# Patient Record
Sex: Female | Born: 1955
Health system: Southern US, Community
[De-identification: ages and names within clinical notes are randomized; demographics above are authoritative.]

## PROBLEM LIST (undated history)

## (undated) DIAGNOSIS — I10 Essential (primary) hypertension: Secondary | ICD-10-CM

## (undated) DIAGNOSIS — E0590 Thyrotoxicosis, unspecified without thyrotoxic crisis or storm: Secondary | ICD-10-CM

## (undated) DIAGNOSIS — B019 Varicella without complication: Secondary | ICD-10-CM

## (undated) HISTORY — DX: Varicella without complication: B01.9

## (undated) NOTE — Nursing Note (Signed)
 Formatting of this note might be different from the original. -------------------------------------------------------------------------------- HCM SUPPORT SERVICES -------------------------------------------------------------------------------- HCM Support Services User Fields:  -------------------------------------------------------------------------------- HCM DISCHARGE PLANNING -------------------------------------------------------------------------------- HCM Discharge Planning Comments:  --- 03/19/2024 09:03 PM by Delon Pesa --- CM received forwarded message from MD: ?PT recommended home health. I?ll be  working in her dc shortly?.  Pt presents with acute CVA. CM met with PT and dtr at bedside to complete  assessment. CM introduced self, discussed role of CM in DCP process. PTA Pt. is  IADLs, No DME used. Pt traveling with her dtr when she fell ill, they were on  their way to [facility], helping her dtr move. Pt lives in [place] with her spouse. Pt has  no HH, HD, CS or O2. Data on face sheet confirmed. Pt has no ACP docs.  Prior  hx of brain tumor 3 yrs ago. DCP: home, pending medical clearance, family will transport. Pt will contact  her PCP for [facility] upon returning to [place]. Family is very supportive and will be  available to assist Pt as needed. Pt reports she has been up walking on her own.  -------------------------------------------------------------------------------- HCM DISCHARGE PLANNING EVALUATION -------------------------------------------------------------------------------- Case Workers:   Simpson,Jennifer Living Status:  Spouse-partner Setting:        Home-residence  ADL Limits:     None or n/a  DME:            None  HCM Discharge Systems developer Fields:  Advertising copywriter Prior to Admission:  None or NA  Current Mental Status/Cognition:  Alert and Oriented  Information obtained from:  Patient                             Family or relative Discharge Barriers, select  all that apply:  None or NA  Readmission (unplanned) in the last 30 days:  No  Patient goals and preferences after discharge:  home  Based on information gathered, is it likely that the patient's care needs can  be met in the environment from which he/she entered the hospital?:  Yes  Proposed Discharge Plan, select one:  Home  Home Services needed:  None or NA  If a caregiver is needed, is there a caregiver available, willing and capable  to provide care?:  Yes  If Yes, provide name and contact number:  family  Community services needed:  None  New DME required at discharge:  None or NA  If new DME is required, is patient able to obtain DME?:  Yes  Home Modifications required:  None or NA  If home modifications are required, is patient able to obtain them?:  Not  Applicable  Patient concerns about obtaining medications on day of discharge?:  None  Transportation needs at discharge:  family  Discharge Plan Discussed with:  Patient                                 Family Have you discussed with the patient how his/her care needs may change over  time?:  Yes  Patient/representative agrees with discharge plan:  Yes  Discussed expected insurance coverage and/or out of pocket expenses:  No  Date / Time:  03/19/2024 8:58 PM  Evaluated by:  Pesa Delon   Date of documentation:  -------------------------------------------------------------------------------- Discharge Planning Additional Information --------------------------------------------------------------------------------  ================================================================================ Updated by:     Simpson,Jennifer ([id number]) - 03/19/2024 8:03 PM ================================================================================ Electronically signed  by Delon Pesa, LCSW at 03/19/2024 21:03 EDT

## (undated) NOTE — Progress Notes (Signed)
 Formatting of this note is different from the original. Images from the original note were not included.  HOSPITAL MEDICINE DAILY PROGRESS NOTE Hospital Day: 0  Patient Identifiers:  Barbara Kim 45 y.o.female DOB: Aug 02, 1956  MRN: [id number] ED Bed Date/Time:  03/18/2024 14:19  Attending Physician:  Nelson Kennel, MD    Patient Care Team: Wolm LELON Scarlet, MD as PCP - General (Family Medicine)  MDM ASSESSMENT/PLAN  Barbara Kim is a 55 y.o.  female with PMHx of HTN, history of meningioma, s/p resection, thyroid  disorder, s/p resection presented with headache  Subacute CVA  Left lower extremity paresthesia  Essential hypertension  History of meningioma  Continue to monitor on tele  Continue statin and aspirin  Continue Norvasc , dose increased  CT head with surgical and chronic appearing changes MRI brain without contrast - subacute infarcts, MRI with contrast recommended MRI angiogram head - no evidence of intracranial arterial occlusion or aneurysm CT angio CT angio - 2 mm aneurysm of left ICA  MRI brain with and without contrast pending  Echo pending Discussed with neurology - no need to complete Mri with contrast  I have reviewed the relevant elements of the check-list below with the team and addressed the issues pertinent DVT prophylaxis: scds Class: Outpatient Observation Code Status: Full Code Medication Reconciliation  Foley Catheter: N/A  Isolation: N/A Adult diet Type: Regular; Modifier: Heart healthy Bowel regimen addressed   DISPOSITION  I have evaluated the patient on 03/19/2024 for discharge planning needs.  It is my recommendation that the following post acute services are needed to support a safe discharge plan post hospitalization.  Payor: MEDICARE / Plan: MEDICARE OF [place] PART A AND B / Product Type: Medicare /  Barriers to progression/discharge: neuro consult, MRI brain,. echo Medically stable for discharge date: 1-2 days Recommended  Next Site of Care: home  SUBJECTIVE HISTORY   Interval History: patient reports left leg heaviness, occasional left arm heaviness and tingling. Reports 1 episode of chest pressure 1 month ago lasting 30 seconds. No dizziness right now.   Medications SCHEDULED  Scheduled Medications  Medication Dose Route Frequency Provider Last Rate Last Admin   acetaminophen   1,000 mg Oral Once Rosina Banas, [place]       amLODIPine   5 mg Oral Daily Bernardino Hamlet, MD   5 mg at 03/19/24 9141   aspirin   81 mg Oral Daily Bernardino Hamlet, MD   81 mg at 03/19/24 0858   AS NEEDED  acetaminophen , 650 mg, Q6H PRN butalbital-acetaminophen -caffeine, 1 tablet, Q6H PRN labetalol , 10 mg, Q10M PRN   INFUSIONS  Infusions  Medication Dose Last Rate    EXAM   PHYSICAL EXAM: VITALS INTAKE/OUTPUT  BP 176/78   Pulse 56   Temp 36.6 C (97.9 F)   Resp 20   Ht 1.422 m (4' 8)   Wt 53.1 kg (117 lb)   SpO2 100%   BMI 26.23 kg/m  ----- Temp:  [36.6 C (97.9 F)-36.9 C (98.4 F)] 36.6 C (97.9 F) Heart Rate:  [44-59] 56 Resp:  [16-20] 20 BP: (120-210)/(58-83) 176/78 SpO2:  [97 %-100 %] 100 % (06/23 1000) No intake or output data in the 24 hours ending 03/19/24 1104 Net IO Since Admission: No IO data has been entered for this period [03/19/24 1104]   Physical Exam Gen: NAD, well appearing HEENT: NCAT, PERRL, EOMI, conjunctiva clear, moist MM, neck supple Heart: RRR, no murmurs appreciated Lungs: CTAB, no crackles, rales, rhonchi  Abd: Soft, non tender, not distended, no guarding, +  BS Extremities: no edema, no deformity  MSK: normal ROM, non tender Neuro: CN grossly intact, sensation intact  Psych: AOx3, normal mood and effect Skin: no obvious rashes, lesions, bruising   Access/Catheter: Patient Lines/Drains/Airways Status     Active Lines, Drains & Airways     Name Placement date Placement time Site Days   Peripheral IV 03/18/24 Left Antecubital 03/18/24  1444  Antecubital  1         LABS/RADIOLOGY RESULTS   Imaging: CT angiogram head Per Protocol Result Date: 03/19/2024 [id number] HISTORY: Possible aneurysm COMPARISON: CT head 03/18/2024, MRI/MRA brain 03/18/2024 CT ANGIOGRAM HEAD TECHNIQUE: Multiple contiguous CT images of the brain without contrast were initially obtained. Brain CT angiogram with intravenous administration of Isovue -370 contrast agent was then acquired with 3D reconstructions performed on a separate independent workstation. FINDINGS: PRECONTRAST HEAD CT: There is no evidence of acute intracranial hemorrhage. There is no mass, significant mass effect, or abnormal extra-axial collection. The basal cisterns are patent. Redemonstrated left temporal encephalomalacia with postsurgical changes of the left pterional craniotomy. The ventricles are normal in size. The skull base and calvarium are normal. HEAD CTA: Atherosclerotic disease of the cavernous, and supraclinoid segments of the bilateral internal carotid arteries (ICAs) without significant stenosis. The petrous segment is also without significant stenosis. Tiny 2 mm aneurysm arising from the left plan/supraclinoid ICA, best seen on series 14, image 130. The bilateral ACA and right MCA are patent. Portions of the left MCA are again diminutive with patent collateral circulation to the distal MCA territories. Mild tortuosity of the vertebrobasilar system. The vertebral, basilar, and posterior cerebral arteries are patent.   Tiny 2 mm aneurysm arising from the left clinoid/supraclinoid ICA. Report Created in [facility] [employer]. Interpreted By: Bethann Ku                                 Electronically signed by: Glory Flaming, MD  03/19/2024 8:47 [id number] Report created in [facility] [employer].   MRI angiogram head without contrast Result Date: 03/18/2024 [id number] MRI ANGIOGRAM HEAD WO CONTRAST HISTORY: Stroke eval, left-sided weakness Comparison: MRI brain without contrast 03/18/2024, CT head 03/18/2024  TECHNIQUE: 3D time-of-flight MRA of the head without intravenous contrast administration. Source data and maximum intensity projections (MIPs) were reviewed. 3-D reconstructions were obtained. FINDINGS: Stenosis: Diminutive appearance of the left MCA from its origin with collateral circulation supplying the MCA territory. Aneurysm: Likely Tiny 2 mm infundibulum arising from the left cavernous ICA (series 2, image 96). There is an anterior communicating artery. The right posterior communicating artery is not well seen and likely hypoplastic. The left posterior communicating artery is not well seen and likely hypoplastic. Similar region of encephalomalacia within the left temporal lobe, better seen on same day MRI brain.   1.  No evidence of intracranial arterial occlusion or aneurysm. 2.  Diminutive appearance of the left MCA from its origin, with patent collateral circulation supplying the MCA territory. 3. Potential 2 mm infundibulum arising from the left cavernous ICA. Follow-up CTA recommended to exclude aneurysm. Report Created in [facility] [employer]. Interpreted By: Bethann Ku                                 Electronically signed by: Lonni Lek, MD  03/18/2024 7:04 [id number] Report created in [facility] [employer].   Ultrasound carotid arteries bilateral Result Date: 03/18/2024 [id number] US  CAROTID BILATERAL HISTORY:  Unilateral weakness COMPARISON: None. TECHNIQUE: A bilateral carotid ultrasound and doppler duplex study was performed.  Complete evaluation of the vertebral arteries was made. FINDINGS: Prominent plaque left carotid bulb. Velocities unremarkable. Antegrade flow is seen within the vertebral arteries bilaterally. Normal waveforms are seen throughout. RIGHT: PSV CCA 83; PSV ICA 109; SYS ICA/CCA 1.3; PDV CCA 20; PDV ICA 30;  DIAS ICA/CCA 1.6 LEFT:  PSV CCA 67; PSV ICA 92; SYS ICA/CCA 1.4; PDV CCA 14; PDV ICA 20; DIAS ICA/CCA 1.4 ** Measurements are in cm/sec.   1.  Prominent plaque left  carotid bulb. However velocities are compatible with less than 50% stenosis involving each ICA Interpreted By: Lonni Lek, MD                                Electronically signed by: Lonni Lek, MD  03/18/2024 6:14 [id number] Report created in [facility] [employer].   MRI brain without contrast Result Date: 03/18/2024 [id number] MRI BRAIN WO CONTRAST HISTORY: Weakness left side COMPARISON: CT head 03/18/2024 TECHNIQUE: Multisequence MRI of the brain without intravenous contrast. FINDINGS: Tiny foci of restricted diffusion with corresponding T2 FLAIR hyperintensity involving the right thalamus and occipital lobe. Postsurgical changes of left frontotemporal craniotomy with left temporal encephalomalacia in the region of the resection cavity. There is mild edema surrounding the resection cavity. The ventricles are normal in size. Flow voids of the larger intracranial vessels are present. Paranasal sinuses and mastoid air cells are predominantly clear. Marrow signal pattern is within normal limits.   Tiny foci of restricted diffusion with corresponding T2 FLAIR hyperintensity involving the right thalamus and posterior temporal, and occipital lobe. Findings are favored to reflect subacute infarcts. However, given patient's history of prior brain tumor contrast-enhanced MRI could be obtained. Report Created in [facility] [employer]. Interpreted By: Mabel Belling                                Electronically signed by: Lonni Lek, MD  03/18/2024 6:10 [id number] Report created in [facility] [employer].   CT head without contrast Result Date: 03/18/2024 [id number] CT HEAD WO CONTRAST HISTORY: Pain/trauma COMPARISON: None. TECHNIQUE: CT scan of the head without IV contrast. Sagittal and coronal reformats obtained. FINDINGS: There is no evidence of intracranial hemorrhage, acute loss of distinction between gray and white matter, mass or mass effect. Encephalomalacia involving the temporal region all  the left.. Surgical changes involving the frontal temporal region on the left extending to the left lateral orbit The basal cisterns are patent. The ventricles are normal in size. The density of the dural venous sinuses is normal. The skull base and calvarium are normal. Paranasal sinuses and mastoid air cells are predominantly clear. The visualized orbits are unremarkable.   1.  Surgical and chronic appearing changes with no obvious acute process Interpreted By: Lonni Lek, MD                                Electronically signed by: Lonni Lek, MD  03/18/2024 3:49 [id number] Report created in [facility] [employer].   CT angiogram head Per Protocol Result Date: 03/19/2024 Tiny 2 mm aneurysm arising from the left clinoid/supraclinoid ICA. Report Created in [facility] [employer]. Interpreted By: Bethann Ku  Electronically signed by: Glory Flaming, MD  03/19/2024 8:47 [id number] Report created in [facility] [employer].   MRI angiogram head without contrast Result Date: 03/18/2024 1.  No evidence of intracranial arterial occlusion or aneurysm. 2.  Diminutive appearance of the left MCA from its origin, with patent collateral circulation supplying the MCA territory. 3. Potential 2 mm infundibulum arising from the left cavernous ICA. Follow-up CTA recommended to exclude aneurysm. Report Created in [facility] [employer]. Interpreted By: Bethann Ku                                 Electronically signed by: Lonni Lek, MD  03/18/2024 7:04 [id number] Report created in [facility] [employer].   Ultrasound carotid arteries bilateral Result Date: 03/18/2024 1.  Prominent plaque left carotid bulb. However velocities are compatible with less than 50% stenosis involving each ICA Interpreted By: Lonni Lek, MD                                Electronically signed by: Lonni Lek, MD  03/18/2024 6:14 [id number] Report created in [facility] [employer].   MRI brain without  contrast Result Date: 03/18/2024 Tiny foci of restricted diffusion with corresponding T2 FLAIR hyperintensity involving the right thalamus and posterior temporal, and occipital lobe. Findings are favored to reflect subacute infarcts. However, given patient's history of prior brain tumor contrast-enhanced MRI could be obtained. Report Created in [facility] [employer]. Interpreted By: Mabel Belling                                Electronically signed by: Lonni Lek, MD  03/18/2024 6:10 [id number] Report created in [facility] [employer].   CT head without contrast Result Date: 03/18/2024 1.  Surgical and chronic appearing changes with no obvious acute process Interpreted By: Lonni Lek, MD                                Electronically signed by: Lonni Lek, MD  03/18/2024 3:49 [id number] Report created in [facility] [employer].   EKG: Results for orders placed or performed during the hospital encounter of 03/18/24 (from the past 36 hours)  ECG 12 lead  Result Value Ref Range   Ventricular Rate 56 BPM   Atrial Rate 56 BPM   PR Interval 206 ms   QRS Duration 82 ms   QT Interval 418 ms   QTC Calculation 403 ms   P Axis 65 degrees   R Axis 17 degrees   T Wave Axis 24 degrees   Impression   Sinus bradycardia Otherwise normal ECG  Confirmed by CAMPBELL,M.D., CHRISTOPHER ([id number]), editor MUSE, DARRELL (SCOTT) ([id number]) on 03/18/2024 3:53:25 PM   Labs:  Recent Results (from the past 48 hours)  ECG 12 lead   Collection Time: 03/18/24 14:33  Result Value Ref Range   Ventricular Rate 56 BPM   Atrial Rate 56 BPM   PR Interval 206 ms   QRS Duration 82 ms   QT Interval 418 ms   QTC Calculation 403 ms   P Axis 65 degrees   R Axis 17 degrees   T Wave Axis 24 degrees  GLUCOSE POCT   Collection Time: 03/18/24 14:39  Result Value Ref Range   Glucose POCT 109 65 - 110 mg/dL  CBC w/auto diff  Collection Time: 03/18/24 14:40  Result Value Ref Range   WBC 7.5 4.5 - 11.0 K/uL   RBC  4.62 3.80 - 5.20 M/uL   Hemoglobin 13.8 11.7 - 16.1 g/dL   Hematocrit 57.5 64.9 - 47.0 %   MCV 91.8 81.0 - 102.0 fL   MCH 29.9 25.0 - 35.0 pg   MCHC 32.5 30.0 - 36.0 g/dL   RDW 87.1 88.4 - 85.3 %   MPV 10.1 No Established Reference Range fL   Neutrophils % 41.9 40.0 - 70.0 %   Lymphocytes % 47.7 (H) 15.0 - 45.0 %   Monocytes % 6.3 1.0 - 8.0 %   Eosinophils % 3.3 0.0 - 6.0 %   BASO % 0.8 0.0 - 2.0 %   MONO # 0.5 No Established Reference Range K/uL   EOS # 0.3 No Established Reference Range K/uL   BASO # 0.1 No Established Reference Range K/uL   Platelets 197 150 - 450 K/uL   nRBC 0 0 - 0 /100WBC   LYMPH # 3.6 No Established Reference Range K/uL   ABS NEUT # 3.13 No Established Reference Range K/uL  Comprehensive metabolic panel   Collection Time: 03/18/24 14:40  Result Value Ref Range   Glucose 101 74 - 109 mg/dL   Sodium 862 863 - 854 mMOL/L   Potassium 4.1 3.4 - 4.5 mMOL/L   Chloride 104 98 - 107 mMOL/L   CO2 23 22 - 29 mMOL/L   Anion Gap 14 9 - 18 mMOL/L   BUN 21 8 - 23 mg/dL   Creatinine 9.19 9.49 - 0.96 mg/dL   Calcium 89.6 (H) 8.8 - 10.2 mg/dL   Protein 6.9 6.0 - 8.0 g/dL   Albumin 4.0 3.5 - 5.2 g/dL   A/G Ratio 1.4 >=8.8   Globulin 2.9 1.5 - 3.8 g/dL   Total Bilirubin 0.3 <=8.9 mg/dL   AST 19 <=64 U/L   Alkaline Phosphatase 61 35 - 104 U/L   ALT 14 <=35 U/L   eGFR (2021) 80 >60 mL/min/1.73 m2  Magnesium   Collection Time: 03/18/24 14:40  Result Value Ref Range   Magnesium 2.0 1.6 - 2.4 mg/dL  Magnesium   Collection Time: 03/18/24 20:01  Result Value Ref Range   Magnesium 1.9 1.6 - 2.4 mg/dL  Phosphorus   Collection Time: 03/18/24 20:01  Result Value Ref Range   Phosphorus 2.7 2.5 - 4.5 mg/dL  Urinalysis Complete w/Microscopic w/Reflex culture if indicated (reflex only for > 18 y.o.)   Collection Time: 03/19/24  2:01  Result Value Ref Range   Color Light Yellow Colorless, Yellow, Straw, Dark Yellow, Light Yellow   Appearance Clear Clear   Specific  Gravity 1.020 1.003 - 1.035   pH 5.5 5.0 - 8.0   Leukocyte esterase Negative Negative, Trace, Not Resulted, Interfering Substance /uL   Nitrite Negative Negative, Not Resulted, Interfering Substance   Protein Negative Negative, Trace, Not Resulted, Interfering Substance   Glucose 2+ (A) Negative, Trace, Not Resulted, Interfering Substance   Ketones Negative Negative, Trace, Not Resulted, Interfering Substance   Urobilinogen Negative Negative, Not Resulted, Interfering Substance mg/dL   Urine Bilirubin Negative Negative, Not Resulted, Interfering Substance   Blood Negative Negative, Trace, Not Resulted, Interfering Substance   WBC <1 <=5 /hpf   RBC UA 1 <=3 /hpf   Bacteria None Seen None Seen /hpf   Squamous Epithelial Cells 0-5 0-5, None Seen /hpf   Casts None Seen None Seen   Crystals None Seen None Seen  Mucus None Seen None Seen /lpf  CBC w/auto diff   Collection Time: 03/19/24  4:45  Result Value Ref Range   WBC 5.8 4.5 - 11.0 K/uL   RBC 4.25 3.80 - 5.20 M/uL   Hemoglobin 12.9 11.7 - 16.1 g/dL   Hematocrit 60.1 64.9 - 47.0 %   MCV 93.6 81.0 - 102.0 fL   MCH 30.4 25.0 - 35.0 pg   MCHC 32.4 30.0 - 36.0 g/dL   RDW 86.8 88.4 - 85.3 %   MPV 10.2 No Established Reference Range fL   Neutrophils % 46.0 40.0 - 70.0 %   Lymphocytes % 41.0 15.0 - 45.0 %   Monocytes % 8.7 (H) 1.0 - 8.0 %   Eosinophils % 3.6 0.0 - 6.0 %   BASO % 0.7 0.0 - 2.0 %   MONO # 0.5 No Established Reference Range K/uL   EOS # 0.2 No Established Reference Range K/uL   BASO # 0.0 No Established Reference Range K/uL   Platelets 188 150 - 450 K/uL   nRBC 0 0 - 0 /100WBC   LYMPH # 2.4 No Established Reference Range K/uL   ABS NEUT # 2.65 No Established Reference Range K/uL  Comprehensive metabolic panel   Collection Time: 03/19/24  4:45  Result Value Ref Range   Glucose 118 (H) 74 - 109 mg/dL   Sodium 857 863 - 854 mMOL/L   Potassium 3.9 3.4 - 4.5 mMOL/L   Chloride 108 (H) 98 - 107 mMOL/L   CO2 25 22 - 29  mMOL/L   Anion Gap 13 9 - 18 mMOL/L   BUN 22 8 - 23 mg/dL   Creatinine 9.09 9.49 - 0.96 mg/dL   Calcium 9.0 8.8 - 89.7 mg/dL   Protein 6.4 6.0 - 8.0 g/dL   Albumin 3.7 3.5 - 5.2 g/dL   A/G Ratio 1.4 >=8.8   Globulin 2.7 1.5 - 3.8 g/dL   Total Bilirubin 0.2 <=8.9 mg/dL   AST 13 <=64 U/L   Alkaline Phosphatase 53 35 - 104 U/L   ALT 12 <=35 U/L   eGFR (2021) 70 >60 mL/min/1.73 m2   DISCLAIMER: This chart was created using M-Modal dictation software. Efforts were made by me to ensure accuracy, however some errors may be present due to limitations of this technology and occasionally words are not transcribed as intended.  Electronically Signed:    Nelson Kennel, MD 03/19/24 8381   Nelson Kennel, MD 03/20/24 0840  Electronically signed by Nelson Kennel, MD at 03/19/2024 16:18 EDT Electronically signed by Nelson Kennel, MD at 03/20/2024  8:40 EDT

## (undated) NOTE — Consults (Signed)
 Associated Order(s): IP CONSULT TO CARDIOLOGY Formatting of this note is different from the original. Images from the original note were not included. Cardiology Consultation  Name Barbara Kim Age/Sex 47 y.o./female  [facility] DOB 12/24/1955  PCP Wolm LELON Scarlet, MD MRN [id number]  Note Entered by Carlyon Southgate, NP LOS 1 days   HPI: Reason for consultation CVA, needs heart monitor before discharge  HPI: This 16 year old female with a past medical history significant for HTN, cerebral meningioma s/p left craniotomy (01/2021), and hyperthyroidism presented on 06/22 with a chief complaint of stroke like symptoms. Patient had sudden onset headache and left sided weakness/numbness that has been intermittent over the last couple weeks, but became more constant. She also reports a near syncopal episode. Patient did take aspirin  before coming to the ER. Her symptoms also resolved  This patient has exclusion criteria for stroke protocol for TPA therefore they are not a candidate for lytics Patient found to have a subacute R PCA infarct. Neurology consulted and requesting a 14 day cardiac monitor to rule out atrial arrhythmias. Patient is from [place], [place] and was traveling through to help her daughter move to [place] . Patient with no known significant cardiac history, she denies any history of atrial fibrillation or any irregular heart rhythms.   The history section was last reviewed by Flint LOISE Clement Dwayne, RN on Mar 18, 2024.  History reviewed. No pertinent past medical history.  Past Surgical History:  Procedure Laterality Date   CRANIOTOMY  2022   Social History   Socioeconomic History   Marital status: Married   Additional Smoking   No family history on file.   No family status information on file.   Prior to Admission medications  Not on File   No Known Allergies  Review of Systems: 14 point review of systems performed with all pertinent positives and negatives  as per the HPI.  Objective: Vital signs for last 24 hours: Temp:  [36.4 C (97.5 F)-36.7 C (98.1 F)] 36.6 C (97.9 F) (06/24 1207) Heart Rate:  [48-60] 50 (06/24 1207) Resp:  [16-24] 20 (06/24 1207) BP: (128-169)/(59-80) 151/63 (06/24 1207)  Imaging: No results found.  Scheduled Meds: Scheduled Medications  Medication Dose Route Frequency Provider Last Rate Last Admin   amLODIPine   10 mg Oral Daily Ekaterina Latycheva, MD   10 mg at 03/20/24 0948   aspirin   81 mg Oral Daily Bernardino Hamlet, MD   81 mg at 03/20/24 0947   atorvastatin  40 mg Oral Bedtime Ekaterina Latycheva, MD   40 mg at 03/19/24 2136   Continuous Infusions: Infusions  Medication Dose Last Rate   PRN Meds:PRN Medications: acetaminophen ,butalbital-acetaminophen -caffeine,labetalol   Chemistry  Recent Labs    03/18/24 1440 03/18/24 2001 03/19/24 0445  NA 137  --  142  K 4.1  --  3.9  CL 104  --  108*  CO2 23  --  25  BUN 21  --  22  CREATININE 0.80  --  0.90  GLU 101  --  118*  MG 2.0 1.9  --   PHOS  --  2.7  --    CBC Recent Labs    03/18/24 1440 03/19/24 0445  HGB 13.8 12.9  HCT 42.4 39.8  MCV 91.8 93.6  PLT 197 188   Coags Recent Labs    03/18/24 1440 03/19/24 0445  PROT 6.9 6.4   LFTs Recent Labs    03/18/24 1440 03/19/24 0445  PROT 6.9 6.4  BILITOT 0.3 0.2  ALT 14 12  AST 19 13  ALKPHOS 61 53    Cardiac No results for input(s): CPK, CKMB, TROPONINI in the last 72 hours. No results found for: BNP Endocrine Lab Results  Component Value Date   CHOL 199 03/19/2024   HDL 47 03/19/2024   Lab Results  Component Value Date   HGBA1C 6.1 03/19/2024   No results found for: TSH   PHYSICAL EXAM Gen: NAD, Alert HEENT: PERRL, EOMI, neck supple CV: RRR, normal S1 S2 Lungs: Diminished lung bases, no W/R/R Abd: S/NT/ND, BS (+) Ext: Warm, No clubbing/cyanosis Skin: warm, dry, no rashes Neuro: No focal deficits Psych: Mood approp.   Cardiac History/Studies:   EKG   03/18/24   Results  Assessment/Plan:  Active Hospital Problems:   Acute CVA (cerebrovascular accident) (CMS, HHS-HCC)  Impression/PLAN: Acute CVA -Found to have subacute R PCA stroke  -History of meningioma s/p resection, not related to stroke  -Neurology requesting 14 day monitor to rule out atrial fibrillation  -EKG on admission showed sinus bradycardia with a HR of 56 bpm -ECHO obtained Results for orders placed during the hospital encounter of 03/18/24  Echo Complete  Interpretation Summary Normal left ventricular size and function, ejection fraction 65%. Abnormal diastolic compliance. Trivial mitral and tricuspid regurgitation. Bubble study is negative for obvious right-to-left shunt.  -Telemetry reviewed with no atrial arrhythmias since admission -MCOT ordered, will have placed prior to discharge  -Continue on telemetry   Essential Hypertension -Uncontrolled  -Amlodipine  increased to 10mg  today -Continue to monitor  Hyperlipidemia -Lipid panel with an LDL of 133 -Continue statin therapy   DISCLAIMER: This chart was created using M-Modal dictation software. Efforts were made by me to ensure accuracy, however some errors may be present due to limitations of this technology and occasionally words are not transcribed as intended.  Communication: I examined the patient and reviewed the chart. I discussed the patient's status and plan of care with Patient's Treatment Team Electronically signed: Coding Carlyon Southgate, NP 03/20/2024 / 13:03   Carlyon LOISE Southgate, NP 03/20/24 1330   Carlyon LOISE Southgate, NP 03/20/24 2233  Attending attestation: Patient seen and examined independently. Agree with findings, assessment and plan as per nurse practitioner note, unless otherwise amended. Entirety of medical decision making was made by me.  36 year old female with history of hypertension, cerebral meningioma who was admitted with CVA. Echocardiogram shows no significant structural  heart disease.  EKG and laboratory data reviewed. Pertinent imaging data reviewed.  Physical Exam:  Gen: NAD HEENT: neck supple CV:  Regular rate and rhythm Lungs: Diminished bases Abd: S/NT/ND, BS (+) Ext: Warm, No clubbing/cyanosis  Assessment/Plan:  Hospital Derived Problem List: Patient Active Problem List  Diagnosis   Acute CVA (cerebrovascular accident) (CMS, HHS-HCC)   Brain tumor (CMS, HHS-HCC)   Cerebral infarction, unspecified (CMS, HHS-HCC)   Neoplasm of meninges   Essential hypertension, benign   Family history not obtainable due to adoption   H/O meningioma of the brain   Hallux valgus with bunions   History of cerebral meningioma   Mood swings   Hypertensive disorder   Osteopenia   Osteoporosis   Aneurysm of internal carotid artery (HHS-HCC)   Patient will be discharged on heart rhythm monitor to rule out paroxysmal atrial fibrillation. Continue titration of medical therapy as above.  Counseled on risk factor modification and medical compliance.  Wendelyn Skene, MD Interventional Cardiology  DISCLAIMER: This chart was created using M-Modal dictation software. Efforts were made by  me to ensure accuracy, however some errors may be present due to limitations of this technology and occasionally words are not transcribed as intended.   Wendelyn Skene, MD 03/26/24 [phone]  Electronically signed by Carlyon LOISE Southgate, NP at 03/20/2024 13:30 EDT Electronically signed by Carlyon LOISE Southgate, NP at 03/20/2024 22:33 EDT Electronically signed by Wendelyn Skene, MD at 03/26/2024 14:57 EDT

## (undated) NOTE — ED Provider Notes (Signed)
 Formatting of this note is different from the original. History of Present Illness   Patient Identification Barbara Kim is a 42 y.o. female.  Chief Complaint  Chief Complaint  Patient presents with   Headache    Pt c/o HA all day, lower left leg tingling/ numbness since yesterday, near syncopal episode last week. Pt took ASA PTA. Pt sensation decreased on left lower leg, but rest of body feels the same. Hx HTN (takes amlodipine  daily), brain tumor removal several years ago, her screenings have been good with respect to her monitoring imaging and she most recently had 1 in early June that was unknown the results but they did have her called her about it.  She has had 3 episodes of this left arm and left leg numbness tingling and a little bit of a week sensation that has happened over the course of the past week or.  She said that it never lasts longer than 30 seconds at a time.  She has generally very healthy and does a lot of high intensity exercise.  She last exercised on Friday and had no issues.  Her only medical issue that she is treated for is high blood pressure.      History reviewed. No pertinent past medical history. No family history on file. Past Surgical History:  Procedure Laterality Date   CRANIOTOMY  2022   Current Facility-Administered Medications  Medication Dose Route Frequency Provider Last Rate Last Admin   acetaminophen  (TYLENOL ) tablet 1,000 mg  1,000 mg Oral Once Rosina Banas, [place]       No current outpatient medications on file.   No Known Allergies Social History   Socioeconomic History   Marital status: Married   Review of Systems Pertinent positives and negatives were discussed fully with the patient, and except as described in the HPI, they are negative.   Physical Exam   BP 193/83   Pulse 52   Temp 36.6 C (97.9 F) (Oral)   Resp 18   Ht 142.2 cm   Wt 53.1 kg   SpO2 99%   BMI 26.23 kg/m   PE - Physical examination:  Constitutional:  Oriented, Awake and alert, Well developed, Good Hydration, NAD Eyes: Sclera clear, no icterus, Conjunctivae and lid normal, EOMI;  Ear Nose Mouth Throat: Ear, nose and throat exam grossly unremarkable Neck:  unremarkable Cardiovascular: NL S1/S2, No murmurs gallops or rubs detected, Distal pulses normal,  Respiratory: Normal respiratory effort/excursion, Breath sounds equal bilaterally, No rales, rhonchi, or wheezes,  Chest: No chest deformity, Movement normal, Movement symmetrical;  Gastrointestinal: Abdomen soft, Nontender, Nondistended,  No rebound, guarding, or rigidity.  No appreciable masses or pulsatile masses Genitourinary: Unremarkable;  Musculoskeletal: Normal extremities,  Skin: Skin turgor normal, Mucosal membranes moist, Skin color good;  Neuro:  Very strong motor exam, normal sensory exam, normal cranial nerves, normal finger-to-nose and heel-shin maneuvers Normal coordination, Normal speech;  Psychiatric: No abnormalities of mood or affect;  Lymph: Unremarkable;   ED Course/MDM   Medications  acetaminophen  (TYLENOL ) tablet 1,000 mg (1,000 mg Oral Not Given 03/18/24 1625)   Patient Vitals for the past 24 hrs:  BP Temp Temp src Pulse Resp SpO2 Height Weight  03/18/24 1901 193/83 36.6 C (97.9 F) Oral 52 18 99 % -- --  03/18/24 1423 -- -- -- -- -- -- 142.2 cm 53.1 kg  03/18/24 1418 171/72 36.7 C (98.1 F) -- 59 18 97 % -- --    MDM:  Differential Diagnosis that have been  ruled out based on HPI,ROS, and PE: Acute transverse myelitis, Carotid artery dissection, Guillain-Barre syndrome, Meningitis,  Spinal cord abscess, Spinal cord pathology,  Temporal arteritis  Differential Diagnosis: Brain tumor, Complicated migraine, Multiple sclerosis, Postictal paralysis, Stroke, TIA;   Diagnostic Evaluation: History AND physical examination, Xrays, 12 lead ECG, Blood work, Cardiac monitor, CAT scan;   Amount and complexity of data: The EM Physician performed a face to face evaluation  in this patient, Current EKG reviewed, Current labs reviewed, Current imaging reviewed and independently interpreted    Discussion:  Patient has had transient symptoms lasting less than 30 seconds at a time.  She has only had 3 of these in the past week and she is very healthy.  She has had a prior history of a meningioma with resection.  This patient has exclusion criteria for stroke protocol for TPA therefore they are not a candidate for lytics.  She is here visiting from out of town and passing through.  My suspicion for TIA is actually very low.  I felt that if her MRI was negative and her carotid ultrasounds are negative then I would feel comfortable letting the patient go home to follow up with her primary care physician as an outpatient.  The MRI demonstrated an abnormal finding in the right thalamus and posterior temporal and occipital lobe which appear to represent subacute infarcts.  Medical Decision Making Amount and/or Complexity of Data Reviewed  Prior external medical records reviewed:   Independent Historian utilized for obtaining significant additional historical details relevant to patient care:  []  Spouse, []  EMS, []  Other family member/friend  []  Other: []   Considered hospitalization/escalation of care (also refer to disposition, if unchecked) []   Considered/administered parenteral controlled substances (also refer to administered medications) []   Significant social determinants existed which impacted diagnosis/treatment  Labs:  as ordered. Decision-making details documented in ED Course. Radiology: as ordered + independent interpretation performed. Details documented in results section below. ECG/medicine tests: as ordered + independent interpretation performed. Details documented under results section below.  Consultations: Hospitalist consulted. Treatment options were discussed and plan of care agreed upon.  HEART Score   Results   EKG interpreted per my independent  review of the imaging as below: Results for orders placed or performed during the hospital encounter of 03/18/24  ECG 12 lead  Result Value Ref Range   Ventricular Rate 56 BPM   Atrial Rate 56 BPM   PR Interval 206 ms   QRS Duration 82 ms   QT Interval 418 ms   QTC Calculation 403 ms   P Axis 65 degrees   R Axis 17 degrees   T Wave Axis 24 degrees   Impression   Sinus bradycardia Otherwise normal ECG  Confirmed by CAMPBELL,M.D., CHRISTOPHER ([id number]), editor MUSE, DARRELL (SCOTT) ([id number]) on 03/18/2024 3:53:25 PM   Labs: Results for orders placed or performed during the hospital encounter of 03/18/24  ECG 12 lead   Collection Time: 03/18/24 14:33  Result Value Ref Range   Ventricular Rate 56 BPM   Atrial Rate 56 BPM   PR Interval 206 ms   QRS Duration 82 ms   QT Interval 418 ms   QTC Calculation 403 ms   P Axis 65 degrees   R Axis 17 degrees   T Wave Axis 24 degrees  GLUCOSE POCT   Collection Time: 03/18/24 14:39  Result Value Ref Range   Glucose POCT 109 65 - 110 mg/dL  CBC w/auto diff   Collection Time: 03/18/24  14:40  Result Value Ref Range   WBC 7.5 4.5 - 11.0 K/uL   RBC 4.62 3.80 - 5.20 M/uL   Hemoglobin 13.8 11.7 - 16.1 g/dL   Hematocrit 57.5 64.9 - 47.0 %   MCV 91.8 81.0 - 102.0 fL   MCH 29.9 25.0 - 35.0 pg   MCHC 32.5 30.0 - 36.0 g/dL   RDW 87.1 88.4 - 85.3 %   MPV 10.1 No Established Reference Range fL   Neutrophils % 41.9 40.0 - 70.0 %   Lymphocytes % 47.7 (H) 15.0 - 45.0 %   Monocytes % 6.3 1.0 - 8.0 %   Eosinophils % 3.3 0.0 - 6.0 %   BASO % 0.8 0.0 - 2.0 %   MONO # 0.5 No Established Reference Range K/uL   EOS # 0.3 No Established Reference Range K/uL   BASO # 0.1 No Established Reference Range K/uL   Platelets 197 150 - 450 K/uL   nRBC 0 0 - 0 /100WBC   LYMPH # 3.6 No Established Reference Range K/uL   ABS NEUT # 3.13 No Established Reference Range K/uL  Comprehensive metabolic panel   Collection Time: 03/18/24 14:40  Result Value Ref Range    Glucose 101 74 - 109 mg/dL   Sodium 862 863 - 854 mMOL/L   Potassium 4.1 3.4 - 4.5 mMOL/L   Chloride 104 98 - 107 mMOL/L   CO2 23 22 - 29 mMOL/L   Anion Gap 14 9 - 18 mMOL/L   BUN 21 8 - 23 mg/dL   Creatinine 9.19 9.49 - 0.96 mg/dL   Calcium 89.6 (H) 8.8 - 10.2 mg/dL   Protein 6.9 6.0 - 8.0 g/dL   Albumin 4.0 3.5 - 5.2 g/dL   A/G Ratio 1.4 >=8.8   Globulin 2.9 1.5 - 3.8 g/dL   Total Bilirubin 0.3 <=8.9 mg/dL   AST 19 <=64 U/L   Alkaline Phosphatase 61 35 - 104 U/L   ALT 14 <=35 U/L   eGFR (2021) 80 >60 mL/min/1.73 m2  Magnesium   Collection Time: 03/18/24 14:40  Result Value Ref Range   Magnesium 2.0 1.6 - 2.4 mg/dL   Imaging:  Per my independent review, please refer to formal report. Results for orders placed or performed during the hospital encounter of 03/18/24  CT head without contrast   Narrative   [id number]  CT HEAD WO CONTRAST  HISTORY: Pain/trauma   COMPARISON: None.  TECHNIQUE: CT scan of the head without IV contrast. Sagittal and coronal reformats obtained.  FINDINGS: There is no evidence of intracranial hemorrhage, acute loss of distinction between gray and white matter, mass or mass effect.   Encephalomalacia involving the temporal region all the left.. Surgical changes involving the frontal temporal region on the left extending to the left lateral orbit  The basal cisterns are patent.  The ventricles are normal in size.  The density of the dural venous sinuses is normal.  The skull base and calvarium are normal.  Paranasal sinuses and mastoid air cells are predominantly clear. The visualized orbits are unremarkable.   Impression   1.  Surgical and chronic appearing changes with no obvious acute process  Interpreted By: Lonni Lek, MD                                 Electronically signed by: Lonni Lek, MD  03/18/2024 3:49 [id number] Report created in [facility] [employer].  MRI brain without  contrast   Narrative    [id number]  MRI BRAIN WO CONTRAST  HISTORY: Weakness left side   COMPARISON: CT head 03/18/2024  TECHNIQUE: Multisequence MRI of the brain without intravenous contrast.   FINDINGS:  Tiny foci of restricted diffusion with corresponding T2 FLAIR hyperintensity involving the right thalamus and occipital lobe.   Postsurgical changes of left frontotemporal craniotomy with left temporal encephalomalacia in the region of the resection cavity. There is mild edema surrounding the resection cavity.  The ventricles are normal in size.  Flow voids of the larger intracranial vessels are present.  Paranasal sinuses and mastoid air cells are predominantly clear.  Marrow signal pattern is within normal limits.   Impression   Tiny foci of restricted diffusion with corresponding T2 FLAIR hyperintensity involving the right thalamus and posterior temporal, and occipital lobe. Findings are favored to reflect subacute infarcts. However, given patient's history of prior brain tumor contrast-enhanced MRI could be obtained.  Report Created in [facility] [employer].  Interpreted By: Mabel Belling                                 Electronically signed by: Lonni Lek, MD  03/18/2024 6:10 [id number] Report created in [facility] [employer].  MRI angiogram head without contrast   Narrative   [id number]  MRI ANGIOGRAM HEAD WO CONTRAST  HISTORY: Stroke eval, left-sided weakness   Comparison: MRI brain without contrast 03/18/2024, CT head 03/18/2024  TECHNIQUE: 3D time-of-flight MRA of the head without intravenous contrast administration. Source data and maximum intensity projections (MIPs) were reviewed. 3-D reconstructions were obtained.   FINDINGS:  Stenosis: Diminutive appearance of the left MCA from its origin with collateral circulation supplying the MCA territory.  Aneurysm: Likely Tiny 2 mm infundibulum arising from the left cavernous ICA (series 2, image 96).  There is an  anterior communicating artery. The right posterior communicating artery is not well seen and likely hypoplastic. The left posterior communicating artery is not well seen and likely hypoplastic.  Similar region of encephalomalacia within the left temporal lobe, better seen on same day MRI brain.   Impression   1.  No evidence of intracranial arterial occlusion or aneurysm. 2.  Diminutive appearance of the left MCA from its origin, with patent collateral circulation supplying the MCA territory. 3. Potential 2 mm infundibulum arising from the left cavernous ICA. Follow-up CTA recommended to exclude aneurysm.  Report Created in [facility] [employer].  Interpreted By: Bethann Ku                                 Electronically signed by: Lonni Lek, MD  03/18/2024 7:04 [id number] Report created in [facility] [employer].  Ultrasound carotid arteries bilateral   Narrative   [id number]  US  CAROTID BILATERAL  HISTORY: Unilateral weakness   COMPARISON: None.  TECHNIQUE: A bilateral carotid ultrasound and doppler duplex study was performed.  Complete evaluation of the vertebral arteries was made.  FINDINGS: Prominent plaque left carotid bulb. Velocities unremarkable.  Antegrade flow is seen within the vertebral arteries bilaterally.  Normal waveforms are seen throughout.  RIGHT: PSV CCA 83; PSV ICA 109; SYS ICA/CCA 1.3; PDV CCA 20; PDV ICA 30;  DIAS ICA/CCA 1.6  LEFT:  PSV CCA 67; PSV ICA 92; SYS ICA/CCA 1.4; PDV CCA 14; PDV ICA 20; DIAS ICA/CCA 1.4  ** Measurements are in cm/sec.   Impression   1.  Prominent plaque left carotid bulb. However velocities are compatible with less than 50% stenosis involving each ICA  Interpreted By: Lonni Lek, MD                                 Electronically signed by: Lonni Lek, MD  03/18/2024 6:14 [id number] Report created in [facility] [employer].   Reassessment   Reassessment:   The patient was updated on the status  of care; no clinical worsening appreciated  Disposition   WAS ADMITTED AND TREATED  Condition     Attending/admitting provider: durkin, ryan [[id number]] Estimated length of stay: less than two midnights Admitting diagnosis: acute cva (cerebrovascular accident) (cms, hhs-hcc) [[id number]]  Final Diagnosis   The primary encounter diagnosis was Headache, unspecified headache type. A diagnosis of Cerebrovascular accident (CVA), unspecified mechanism (CMS, HHS-HCC) was also pertinent to this visit.   Lonni CHRISTELLA Shams, MD 03/18/24 1951  Electronically signed by Lonni CHRISTELLA Shams, MD at 03/18/2024 19:51 EDT

## (undated) NOTE — Code Documentation (Signed)
 Formatting of this note is different from the original. Date:03/24/24  Dear Dr: Nelson Kennel, MD   Please respond to this question to accurately reflect the severity of illness of your patient:    Based on your medical judgement and the clinical indicators listed below, can you further specify the known or suspected condition being treated or monitored   (ie, Cerebral edema,  Vasogenic edema,   or other more appropriate diagnosis)?    Questions?  Contact: [place]  [facility] Query Line  at  [phone] . Thank you.   CODER/CDI SPECIALIST QUESTION SECTION: The medical record reflects the following diagnosis/clinical indicators and/or pertinent procedure findings:   Diagnosis/Clinical Indicators and/or Pertinent Procedure Findings Source Document(s)/Date(s)   Patient presents to the hospital with acute headache today with left lower extremity tightness and numbness with some mild weakness.  She says she had noticed changes of her left lower extremity started about 2 weeks ago and would come and go HP 03/18/2024   Subacute CVA  Left lower extremity paresthesia   Discharge summary 03/20/2024   There is mild edema surrounding the resection cavity.  MRI Brain 03/18/2024     PHYSICIAN RESPONSE SECTION: If known, please provide additional documentation in the space below in response to the question stated above and/or document your response within the medical record.      If clinically unable to determine, enter an ?X? in the box corresponding to your answer and/or document within the medical record.   x Clinically unable to determine   GENERAL QUERY N11  (effective date: 07/28/2016) THIS FORM IS A PERMANENT PART OF THE MEDICAL RECORD   Patient Name:Barbara Kim Date:03/18/2024 D/C Date:03/20/2024              FM#:[id number] Acct [id number] Check here if the query generated was verbal:     Electronically signed by Clayburn Aran at 03/24/2024  2:53  EDT Electronically signed by Nelson Kennel, MD at 04/02/2024  7:24 EDT

## (undated) NOTE — Consults (Signed)
 Associated Order(s): IP CONSULT TO NEUROLOGY Formatting of this note is different from the original. [facility] Neurology Consultation Note   ------------------------------------------------------------------------------------------------------------------- Patient Name: Barbara Kim    Date of Birth: 17-Jul-1956 ; Age: 32 y.o.   MRN: [id number]               Primary Care MD:  Wolm LELON Scarlet, MD             Referring Provider: Nelson Kennel, MD  Author(s):  Rockie DELENA Litter, MD  Date: 03/19/2024    Time: 19:02   LOS: 0 days  ----------------------------------------------------------------------------------------------------------------- Reason for Consultation:  stroke  HPI:  Barbara Kim is a 44 y.o. female, right handed, presented with 2 weeks of intermittent left leg and arm numbness and heaviness. Resolved on admission. Hypertensive at home uncontrolled   ------------------------------------------------------------------------------------------------------------------  Past Medical History:  has no past medical history on file.  Surgical History:                              has a past surgical history that includes Craniotomy (2022).  Social History   Non smoker  Family History:  family history is not on file.  Allergies reviewed Pertinent Home Medications reviewed  Inpatient Medications: Scheduled Meds: Scheduled Medications  Medication Dose Route Frequency Provider Last Rate Last Admin   [START ON 03/20/2024] amLODIPine   10 mg Oral Daily Ekaterina Latycheva, MD       aspirin   81 mg Oral Daily Ryan Durkin, MD   81 mg at 03/19/24 0858   atorvastatin  40 mg Oral Bedtime Ekaterina Latycheva, MD       Continuous Infusions: Infusions  Medication Dose Last Rate   PRN Meds:.PRN Medications: acetaminophen ,butalbital-acetaminophen -caffeine,labetalol   Review of Systems: Pertinent items are noted in HPI.  Gen Physical Exam: Blood pressure 151/71, pulse 60,  temperature 36.7 C (98.1 F), resp. rate 18, height 1.422 m (4' 8), weight 53.1 kg (117 lb), SpO2 97%, not currently breastfeeding. Weight (last 2 days)    Date/Time Weight   03/18/24 1423 53.1 kg (117 lb)    General Appearance: WDWN  Neurological Exam: MSE: Alert and fully oriented Normal registration, comprehension, attention and concentration Speech nondysarthic language with naming, repetition and reading intact  CN: II:  PERRL, VF intact III/IV/VI: EOMI V: symmetric VII: symmetric VIII: hearing was normal IX/X: uvula was midline and the palate elevation was symmetric  XI: headmidline XII: tongue midline  Sensation: intact  Motor:  Full strength  Reflexes:1+  Coordination: No dysmetria  Labs: (all relevant labs were reviewed and if necessary summarized in HPI)  Stroke Work Up: (All neuro images were personally reviewed and interpreted)    CUS no stenosis CTA head: no stenosis or occlusion: 2 mm L ICA neurysm arising from the left plan/supraclinoid ICA, patient vertebrobasilar  MRI Brain WO performed on 6/23: R PCA infarcts punctate, left temporal encephalomalacia  Risk stratification Labs Lipid Panel  Component Value Date   TRIG 96 03/19/2024   CHOL 199 03/19/2024   HDL 47 03/19/2024   LDLCALC 133 (H) 03/19/2024   CHOLHDL 4.2 03/19/2024   No results found for: HGBA1C   Assessment: Barbara Kim 38 y.o. female with subacute R PCA infarct. Ok to cancel MRI [facility] as imaging findings are clearly consistent with subacute stroke and not related to prior meningioma. L ICA aneurysm not related and can be followed as an outpatient  Diagnosis: #subacute R PCA stroke #hypertension #hyperlipidemia #  Prior left temporal meningioma s/p resection  Stroke etiology:  cryptogenic  Recommendations Q4 Neuro checks Telemetry while admistted Stroke work up is incomplete: TTE, 14 day heart rate monitoring for atrial fibrillation  A: Secondary Stroke  Prevention ASA 81 mg  B: Blood pressure Regulation 1st 24hrs post symptom onset--> Permissive HTN  up to SBP 200-, avoid large fluctuations, hypotension, hydralazine , hold/limit home antihypertensives >24hr out-->daily gradual reduction BP 25-30% with overall goal : normotensive  C: Cholesterol, panel  LDL in particular (Goal: <70) Above goal, high intensity statin  D:   HgA1c  (Goal<7)  HgA1c ordered Avoid significant hyperglycemia, BG <180 Care per primary team  E. Tobacco Cessation if applicable  PT/OT/SLP    Thank you for involving me in the care of this patient. Please don't hesitate to contact me with any questions or concerns regarding the patient.  Amount and/or Complexity of Data Reviewed Clinical lab tests: cbc/cmp Tests in the radiology section of CPT:  Tests in the medicine section of CPT: reviewed EKG Discussion of test results with the performing providers:  Obtain history from someone other than the patient: Daughter at bedside Review and summarize past medical records: hospital admission note and progress note 6/23 Discuss the patient with other providers: discussed the patient care management plan with Dr. Thomasine Independent visualization of images,: I personally reviewed and interpreted MRI  Risk of Complications, Morbidity, and/or Mortality high  Signature: Rockie DELENA Litter, MD       Date: 03/19/2024     Time: 19:02    Rockie DELENA Litter, MD 03/19/24 1910  Electronically signed by Rockie DELENA Litter, MD at 03/19/2024 19:10 EDT

## (undated) NOTE — Progress Notes (Signed)
 Formatting of this note is different from the original. Images from the original note were not included.   [facility] Neurology Follow-Up Note   ------------------------------------------------------------------------------------------------------------------- Patient Name: Barbara Kim    Date of Birth: 1955-12-04 ; Age: 10 y.o.   MRN: [id number]               Primary Care MD:  Wolm LELON Scarlet, MD             Referring Provider: Nelson Kennel, MD  Author(s):  Toribio Distance, NP  Date: 03/20/2024    Time: 9:51   LOS: 1 day  ----------------------------------------------------------------------------------------------------------------- Reason for Follow-Up:  CVA management  Brief HPI:  Barbara Kim is a 65 y.o. female, right handed, presented with 2 weeks of intermittent left leg and arm numbness and heaviness. Resolved on admission. Hypertensive at home uncontrolled.   MRI brain indicating subacute infarcts in the right thalamus, posterior temporal, and occipital lobe.  MRI angiogram of head with no evidence of intracranial arterial occlusion or aneurysm.  CT angiogram of the head with a tiny 2 mm aneurysm arising from the left clinoid/supraclinoid ICA.  CT head with surgical and chronic appearing changes with no acute process.  Neurology asked to assist with management. ------------------------------------------------------------------------------------------------------------------  Interval history.  No acute neurological changes reported overnight.  No seizures.  Hospitalist note reviewed from 03/19/2024.  Vital signs reviewed from this morning notable for a blood pressure of 169/80, heart rate 54, temperature 97.7.  Lab work including CBC and chemistry reviewed from 03/19/2024.  ------------------------------------------------------------------------------------------------------------------  Workup MRI brain 6/23: R PCA infarcts punctate, left temporal encephalomalacia MRI  angiogram of head with no evidence of intracranial arterial occlusion or aneurysm.   CT angiogram of the head with a tiny 2 mm aneurysm arising from the left clinoid/supraclinoid ICA.   CT head with surgical and chronic appearing changes with no acute process.  ECG sinus bradycardia LDL 133, hemoglobin A1c 6.1 TTE pending  Inpatient Medications: Scheduled Meds: Scheduled Medications  Medication Dose Route Frequency Provider Last Rate Last Admin   amLODIPine   10 mg Oral Daily Ekaterina Latycheva, MD   10 mg at 03/20/24 0948   aspirin   81 mg Oral Daily Ryan Durkin, MD   81 mg at 03/20/24 0947   atorvastatin  40 mg Oral Bedtime Ekaterina Latycheva, MD   40 mg at 03/19/24 2136   Continuous Infusions: Infusions  Medication Dose Last Rate   PRN Meds:.PRN Medications: acetaminophen ,butalbital-acetaminophen -caffeine,labetalol   Gen Physical Exam: Blood pressure 169/80, pulse 54, temperature 36.5 C (97.7 F), temperature source Oral, resp. rate 24, height 1.422 m (4' 8), weight 53.1 kg (117 lb), SpO2 96%, not currently breastfeeding. Weight (last 2 days)    Date/Time Weight   03/18/24 1423 53.1 kg (117 lb)    General Appearance: WDWN  Neurological Exam: MSE: Alert and fully oriented Normal registration, comprehension, attention and concentration Speech nondysarthic language with naming, repetition and reading intact  CN: II:  PERRL, VF intact III/IV/VI: EOMI V: symmetric VII: symmetric VIII: hearing was normal IX/X: uvula was midline and the palate elevation was symmetric  XI: headmidline XII: tongue midline  Sensation: intact  Motor:  Full strength  Reflexes:1+  Coordination: No dysmetria  Assessment: [name] 20 y.o. female with subacute R PCA infarct. Ok to cancel MRI [facility] as imaging findings are clearly consistent with subacute stroke and not related to prior meningioma. L ICA aneurysm not related and can be followed as an  outpatient.  Diagnosis: #subacute R PCA stroke #  hypertension #hyperlipidemia #Prior left temporal meningioma s/p resection  Stroke etiology:  cryptogenic  Recommendations Q4 Neuro checks Telemetry while admistted Stroke work up is incomplete: TTE, 14 day heart rate monitoring for atrial fibrillation  A: Secondary Stroke Prevention ASA 81 mg  B: Blood pressure Regulation 1st 24hrs post symptom onset--> Permissive HTN  up to SBP 200-, avoid large fluctuations, hypotension, hydralazine , hold/limit home antihypertensives >24hr out-->daily gradual reduction BP 25-30% with overall goal : normotensive  C: Cholesterol, panel  LDL in particular (Goal: <70) Above goal, high intensity statin  D:   HgA1c  (Goal<7)  HgA1c at goal Avoid significant hyperglycemia, BG <180 Care per primary team  E. Tobacco Cessation if applicable  PT/OT/SLP    Thank you for involving me in the care of this patient. Please don't hesitate to contact me with any questions or concerns regarding the patient.   Risk of Complications, Morbidity, and/or Mortality Moderate  Signature: Toribio Distance, NP       Date: 03/20/2024     Time: 9:51   Toribio Distance, NP 03/20/24 1239   Rockie DELENA Litter, MD 03/20/24 1647  Cosigned by Rockie DELENA Litter, MD at 03/20/2024 16:47 EDT Electronically signed by Toribio Distance, NP at 03/20/2024 12:39 EDT Electronically signed by Rockie DELENA Litter, MD at 03/20/2024 16:47 EDT  Associated attestation - Litter Rockie DELENA, MD - 03/20/2024 1647 EDT Formatting of this note might be different from the original. TTE is unremarkable, extended Holter monitor to be placed prior to discharge.  Otherwise no further acute neurologic workup at this time.   Pertinent data, labs, images were reviewed in the medical record. The assessment and plan were discussed with APP and I agree with the documentation. This was updated, edited and additional orders were placed as needed. Edit and orders were  completed in the EMR. All medical decisions were performed by myself or in conjunction with the APP.

## (undated) NOTE — Assessment & Plan Note (Signed)
 Formatting of this note is different from the original. Images from the original note were not included. Physical Therapy Evaluation-Inpatient  Patient Name:  Barbara Kim   Date of Birth:  [birth year]  MRN: [id number]    LOS: 1 days Attending Physician: Nelson Kennel, MD  Problem List Patient Active Problem List  Diagnosis   Acute CVA (cerebrovascular accident) (CMS, HHS-HCC)   Past Medical History History reviewed. No pertinent past medical history.  Past Surgical History Past Surgical History:  Procedure Laterality Date   CRANIOTOMY  2022     ICD-10-CM   1. Headache, unspecified headache type  R51.9    2. Cerebrovascular accident (CVA), unspecified mechanism (CMS, HHS-HCC)  I63.9     Precautions Other (comment): subacute infarct involving the right thalamus and posterior temporal, and occipital lobe.  Subjective/Pain Assessment  Subjective: Pt supine bed, agreed to therapy. Pt standing in room at bedside  Objective Evaluation    Cognition - 03/20/24 1029      Cognition   Overall cognitive status Within Functional Limits    Attention span Appears intact    Following commands Follows one-step commands without difficulty    Safety judgement --   impulsive at times    Perception   Motor planning Appears intact     Communication   Communication Within functional limits        Home/PLOF - 03/20/24 1028      Living Situation   Lives with Family    Type of home House    Home layout Two-level;Bed/Bath upstairs    Entrance Stairs     Level of ADL Independence   Level of independence Independence with ADLs    General mobility Independence with ambulation    Receives assistance from Family    Other (comment) Travelling with family from out of town; independent at baseline     Modified Rankin-Prior Level of Function   Modified Rankin Score-Prior Level of Function 0        Lower Extremity - 03/20/24 1029      RLE Assessment   RLE assessment  Within Functional Limits     LLE Assessment   LLE assessment Within Functional Limits        Sensation - 03/20/24 1029      Sensation   Light touch No apparent deficits        Bed Mobility/Transfers - 03/20/24 1029      Bed Mobility   Supine to sit Independent    Sit to supine Independent    Rolling Independent     Transfers   Sit to stand Stand-by assist        GaitWC Skills - 03/20/24 1029      Gait Skills   Level of independence Stand-by assist        Balance - 03/20/24 1029      Balance   Static sitting balance Independent static sit    Dynamic sitting balance Independent during activity    Static standing balance Stand by assist    Dynamic standing balance Stand by assist       Ambulated >200' with SBA, no LOB. Mildly impulsive at times, verbal cues for safety  Ascended/descended 8 steps x 2 reps without LOB however very quick pace, cues for safety as pt in flip flops. No LOB with stairs.  Assessment    Assessment - 03/20/24 1030      Assessment   Mobility assessment Patient at baseline    Modified Rankin Score 0  Overall assessment Pt reports symptoms have resolved and feels as though she has returned to near baseline with mobility and strength. L LE MMT appears equal to R LE MMT, no sensation or visual deficits. Mildly impulsive at times but corrects with verbal cues    Rehab potential Patient already at baseline    Prognosis Good       Plan/Recommendations    Plan/Recommendations - 03/20/24 1030      Plan/Recommendations   PT IP frequency One time visit     Recommendations   PT Discharge recommendations Home without post-acute therapy needs       Time Calculation  Eval Time Calculation - 03/20/24 1030      PT Visit Status   PT Visit Status Visit completed     PT Eval Time Calculation   Start time 0859    Stop time 0908    Time calculation (min) 9       Goals and treatment plan discussed and agreed upon with  patient and family   Treatment will continue unless new orders are written  Damien Essex, PT 03/20/2024 10:31   Cosigned by Nelson Kennel, MD at 03/23/2024 20:18 EDT Electronically signed by Damien Essex, PT at 03/20/2024 10:32 EDT Electronically signed by Nelson Kennel, MD at 03/23/2024 20:18 EDT

## (undated) NOTE — Assessment & Plan Note (Signed)
 Formatting of this note is different from the original. Images from the original note were not included. Occupational Therapy Evaluation-Inpatient  Patient Name:  Barbara Kim   Date of Birth:  Mar 24, 1956  MRN: [id number]    LOS: 1 days Attending Physician: Nelson Kennel, MD  Problem List Patient Active Problem List  Diagnosis   Acute CVA (cerebrovascular accident) (CMS, HHS-HCC)   Past Medical History History reviewed. No pertinent past medical history.  Past Surgical History Past Surgical History:  Procedure Laterality Date   CRANIOTOMY  2022       ICD-10-CM   1. Headache, unspecified headache type  R51.9    2. Cerebrovascular accident (CVA), unspecified mechanism (CMS, HHS-HCC)  I63.9       Precautions Other (comment): Subacute CVA   Left lower extremity paresthesia.PMHx of HTN, history of meningioma, s/p resection, thyroid  disorder, s/p resection  Subjective   I feel great  Pain Assessment   Denies pain, nausea or vision issues  Objective Evaluation    Cognition - 03/20/24 1030      Cognition   Overall cognitive status Within Functional Limits    Direction following Within Functional Limits    Attention span Appears intact    Memory Appears intact    Following commands Follows all commands and directions without difficulty    Safety judgement Good awareness of safety precautions        Home/PLOF - 03/20/24 1029      Living Situation   Lives with Spouse    Type of home House    Entrance Stairs    Bathroom toilet Standard     Level of ADL Independence   Level of independence Independent with ADLs    General mobility Independent with ambulation        ADL Assessment - 03/20/24 8970      ADL Assessment   Grooming Independent    Dressing upper body Independent    Dressing lower body Independent    Management of clothes down Independent    Management of clothes up Independent    Toileting Independent    Where assessed toileting  Toilet    Toilet Transfer Method Ambulating    Toileting hygiene Independent        Functional Mobility - 03/20/24 1030      Functional Mobility   Sit to stand Independent    Stand to sit Independent    Bed mobility Independent    Functional transfer Independent    Room mobility Independent    Toilet transfers Independent        Upper Extremity - 03/20/24 1030      RUE Assessment   RUE assessment Within Functional Limits     LUE Assessment   LUE assessment Within Functional Limits     OTHER   Initiation Appears intact    Motor planning Appears intact        Hand Function - 03/20/24 1030      Hand Function   Right gross grasp Within functional limits    Left gross grasp Within functional limits    ROM deficits No    Coordination Functional    Hand dominance Right        Sensation - 03/20/24 1030      Sensation   Light touch No apparent deficits    Pressure No apparent deficits    Proprioception No apparent deficits       Assessment    Assessment - 03/20/24 1030  Assessment   Rehab potential --   pts deficits resolved, pt. at baseline and safe for DC   Progress Discontinue occupational therapy       Plan/Recommendations    Plan/Recommendations - 03/20/24 1031      Treatment Plan   IP Frequency One-time visit     OT Recommendations   OT Discharge recommendation Home without post-acute therapy needs    Equipment recommendation None       Time Calculation  Eval Time Calculation - 03/20/24 1027      OT Visit Status   OT Visit Status Visit completed     OT Eval Time Calculation   Start time 0820    Stop time 0830    Time calculation (min) 10       Goals and treatment plan discussed and agreed upon with patient and daughter   Edsel Bird, [place] 03/20/2024 10:31   Cosigned by Nelson Kennel, MD at 03/23/2024 20:18 EDT Electronically signed by Edsel Bird, OT at 03/20/2024 10:33 EDT Electronically signed  by Nelson Kennel, MD at 03/23/2024 20:18 EDT

## (undated) NOTE — H&P (Signed)
 Formatting of this note is different from the original. Images from the original note were not included.  HOSPITAL MEDICINE  ADMISSION HISTORY AND PHYSICAL  Patient Identifiers:  Barbara Kim 80 y.o.female DOB: [birth year]  MRN: [id number] ED Bed Date/Time:  03/18/2024 14:19  Attending Physician:  Bernardino SHAUNNA Hamlet, MD    Patient Care Team: Wolm LELON Scarlet, MD as PCP - General (Family Medicine)  MDM Assessment  Barbara Kim is a 77 y.o. year old female chief complaint of headache PMHx HTN, h/x of meningioma s/p resection and thyroid  disorder sp resection.   Subacute CVAs Left lower extremity paresthesia Essential HTN H/x of meningioma  MDM Plan   This case was directly dicussed with the EM provider/staff and work-up reviewed together and independently. I have reviewed prior EMR inpatient/outpatient documentation as unique sources of history and information. I will be starting observation status with anticipated length of stay less than two midnights.  cardiac monitoring and remote telemetry Q4h neuro checks High-intensity statin Asa 81 mg daily Patient is outside the window for permissive HTN Still some concern that patients meningioma could be a contributing factor to the multiple infarcts on MRI, rads recommended MRI brain with contrast to better visualize However, with presence of multiple infarcts, some concern for embolic course, will order echo Restart patients amlodipine  5 mg daily, bp control CTA to evaluate possible aneurysm on MRA head without contrast  FULL CODE I have reviewed the relevant elements of the check-list below with the team and addressed the issues pertinent DVT prophylaxis: held Class: Outpatient Observation Code Status: Full Code Medication Reconciliation  Foley Catheter: N/A  Isolation: N/A Adult diet Type: Regular; Modifier: Heart healthy Bowel regimen addressed   HPI and ROS   History of Present Illness:   Barbara Kim is a 39  y.o. year old female chief complaint of headache PMHx HTN, h/x of meningioma s/p resection and thyroid  disorder sp resection.  Patient presents to the hospital with acute headache today with left lower extremity tightness and numbness with some mild weakness.  She says she had noticed changes of her left lower extremity started about 2 weeks ago and would come and go.  However repeated today and concerned her enough that she presented to the hospital.  She took aspirin  before arrival today.  It was stated that she was had 3 episodes of the numbness tingling weakness over the past week or so and will last longer than 30 seconds at a time.  Her past medical history includes meningioma status post brain tumor removal and thyroid  disorder status post surgical resection.  She had undergone repeat MRIs in the past couple years for reoccurrence and had 1 earlier June that was clear.  She denies any chest pain nausea vomiting diarrhea constipation or vision changes.  CT head without contrast showed surgical and chronic appearing changes with no obvious acute process.  MRI brain without contrast showed tiny foci of restricted diffusion with corresponding T2 flair hyperintensity involving the right thalamus and posterior temporal and occipital lobe.  Findings are favored to reflect subacute infarcts.  Ultrasound of the carotids bilaterally showed a prominent plaque left carotid bulb.  MRI head showed no evidence of intracranial arterial occlusion however a potential 2 mm infundibulum arising from left cavernous ICA.  Follow up CTA recommended to exclude aneurysm.  Labs showed no significant findings.  Patient admitted to hospitalist service.  Review of Systems: Pertinent positives and negatives are listed above in the History of Present Illness, all other  systems reviewed and are otherwise negative.  PAST MEDICAL, SURGICAL, SOCIAL, AND FAMILY HISTORY   Prior Medical History Prior Surgical History  History reviewed.  No pertinent past medical history. Past Surgical History:  Procedure Laterality Date   CRANIOTOMY  2022   Prior Family History Prior Social History  No family history on file. Social History   Socioeconomic History   Marital status: Married    MEDICATIONS HOME MEDICATIONS  Prior to Admission medications  Not on File   Home Medications verified and updated by Pharm Tech: []  Yes, completed  [x]  No, to be verified and completed   ALLERGIES  No Known Allergies   OBJECTIVE   PHYSICAL EXAM: VITALS INTAKE/OUTPUT  Vitals:   03/18/24 2130  BP: 152/68  Pulse: 59  Resp:   Temp:   SpO2: 100%   SpO2:  [97 %-100 %] 100 % (06/22 2130) No intake or output data in the 24 hours ending 03/18/24 2223   General: appears stated age, NAD, AxO x3 HEENT: [place]/AT Chest/pulm: CTA, even CV: RRR Abd: Soft, NT, ND Skin/MSK: no edema, no rash Cranial nerves are grossly intact 5/5 in bilateral upper and lower extremities  Access/Catheter: Patient Lines/Drains/Airways Status     Active Lines, Drains & Airways     Name Placement date Placement time Site Days   Peripheral IV 03/18/24 Left Antecubital 03/18/24  1444  Antecubital  less than 1        LABS   Imaging: MRI angiogram head without contrast Result Date: 03/18/2024 [id number] MRI ANGIOGRAM HEAD WO CONTRAST HISTORY: Stroke eval, left-sided weakness Comparison: MRI brain without contrast 03/18/2024, CT head 03/18/2024 TECHNIQUE: 3D time-of-flight MRA of the head without intravenous contrast administration. Source data and maximum intensity projections (MIPs) were reviewed. 3-D reconstructions were obtained. FINDINGS: Stenosis: Diminutive appearance of the left MCA from its origin with collateral circulation supplying the MCA territory. Aneurysm: Likely Tiny 2 mm infundibulum arising from the left cavernous ICA (series 2, image 96). There is an anterior communicating artery. The right posterior communicating artery is not well seen and  likely hypoplastic. The left posterior communicating artery is not well seen and likely hypoplastic. Similar region of encephalomalacia within the left temporal lobe, better seen on same day MRI brain.   1.  No evidence of intracranial arterial occlusion or aneurysm. 2.  Diminutive appearance of the left MCA from its origin, with patent collateral circulation supplying the MCA territory. 3. Potential 2 mm infundibulum arising from the left cavernous ICA. Follow-up CTA recommended to exclude aneurysm. Report Created in [facility] [employer]. Interpreted By: Bethann Ku                                 Electronically signed by: Lonni Lek, MD  03/18/2024 7:04 [id number] Report created in [facility] [employer].   Ultrasound carotid arteries bilateral Result Date: 03/18/2024 [id number] US  CAROTID BILATERAL HISTORY: Unilateral weakness COMPARISON: None. TECHNIQUE: A bilateral carotid ultrasound and doppler duplex study was performed.  Complete evaluation of the vertebral arteries was made. FINDINGS: Prominent plaque left carotid bulb. Velocities unremarkable. Antegrade flow is seen within the vertebral arteries bilaterally. Normal waveforms are seen throughout. RIGHT: PSV CCA 83; PSV ICA 109; SYS ICA/CCA 1.3; PDV CCA 20; PDV ICA 30;  DIAS ICA/CCA 1.6 LEFT:  PSV CCA 67; PSV ICA 92; SYS ICA/CCA 1.4; PDV CCA 14; PDV ICA 20; DIAS ICA/CCA 1.4 ** Measurements are in cm/sec.   1.  Prominent  plaque left carotid bulb. However velocities are compatible with less than 50% stenosis involving each ICA Interpreted By: Lonni Lek, MD                                Electronically signed by: Lonni Lek, MD  03/18/2024 6:14 [id number] Report created in [facility] [employer].   MRI brain without contrast Result Date: 03/18/2024 [id number] MRI BRAIN WO CONTRAST HISTORY: Weakness left side COMPARISON: CT head 03/18/2024 TECHNIQUE: Multisequence MRI of the brain without intravenous contrast. FINDINGS: Tiny foci of  restricted diffusion with corresponding T2 FLAIR hyperintensity involving the right thalamus and occipital lobe. Postsurgical changes of left frontotemporal craniotomy with left temporal encephalomalacia in the region of the resection cavity. There is mild edema surrounding the resection cavity. The ventricles are normal in size. Flow voids of the larger intracranial vessels are present. Paranasal sinuses and mastoid air cells are predominantly clear. Marrow signal pattern is within normal limits.   Tiny foci of restricted diffusion with corresponding T2 FLAIR hyperintensity involving the right thalamus and posterior temporal, and occipital lobe. Findings are favored to reflect subacute infarcts. However, given patient's history of prior brain tumor contrast-enhanced MRI could be obtained. Report Created in [facility] [employer]. Interpreted By: Mabel Belling                                Electronically signed by: Lonni Lek, MD  03/18/2024 6:10 [id number] Report created in [facility] [employer].   CT head without contrast Result Date: 03/18/2024 [id number] CT HEAD WO CONTRAST HISTORY: Pain/trauma COMPARISON: None. TECHNIQUE: CT scan of the head without IV contrast. Sagittal and coronal reformats obtained. FINDINGS: There is no evidence of intracranial hemorrhage, acute loss of distinction between gray and white matter, mass or mass effect. Encephalomalacia involving the temporal region all the left.. Surgical changes involving the frontal temporal region on the left extending to the left lateral orbit The basal cisterns are patent. The ventricles are normal in size. The density of the dural venous sinuses is normal. The skull base and calvarium are normal. Paranasal sinuses and mastoid air cells are predominantly clear. The visualized orbits are unremarkable.   1.  Surgical and chronic appearing changes with no obvious acute process Interpreted By: Lonni Lek, MD                                 Electronically signed by: Lonni Lek, MD  03/18/2024 3:49 [id number] Report created in [facility] [employer].   MRI angiogram head without contrast Result Date: 03/18/2024 1.  No evidence of intracranial arterial occlusion or aneurysm. 2.  Diminutive appearance of the left MCA from its origin, with patent collateral circulation supplying the MCA territory. 3. Potential 2 mm infundibulum arising from the left cavernous ICA. Follow-up CTA recommended to exclude aneurysm. Report Created in [facility] [employer]. Interpreted By: Bethann Ku                                 Electronically signed by: Lonni Lek, MD  03/18/2024 7:04 [id number] Report created in [facility] [employer].   Ultrasound carotid arteries bilateral Result Date: 03/18/2024 1.  Prominent plaque left carotid bulb. However velocities are compatible with less than 50% stenosis involving each ICA Interpreted By: Lonni Lek, MD  Electronically signed by: Lonni Lek, MD  03/18/2024 6:14 [id number] Report created in [facility] [employer].   MRI brain without contrast Result Date: 03/18/2024 Tiny foci of restricted diffusion with corresponding T2 FLAIR hyperintensity involving the right thalamus and posterior temporal, and occipital lobe. Findings are favored to reflect subacute infarcts. However, given patient's history of prior brain tumor contrast-enhanced MRI could be obtained. Report Created in [facility] [employer]. Interpreted By: Mabel Belling                                Electronically signed by: Lonni Lek, MD  03/18/2024 6:10 [id number] Report created in [facility] [employer].   CT head without contrast Result Date: 03/18/2024 1.  Surgical and chronic appearing changes with no obvious acute process Interpreted By: Lonni Lek, MD                                Electronically signed by: Lonni Lek, MD  03/18/2024 3:49 [id number] Report created in [facility] [employer].    EKG: Results for orders placed or performed during the hospital encounter of 03/18/24 (from the past 36 hours)  ECG 12 lead  Result Value Ref Range   Ventricular Rate 56 BPM   Atrial Rate 56 BPM   PR Interval 206 ms   QRS Duration 82 ms   QT Interval 418 ms   QTC Calculation 403 ms   P Axis 65 degrees   R Axis 17 degrees   T Wave Axis 24 degrees   Impression   Sinus bradycardia Otherwise normal ECG  Confirmed by CAMPBELL,M.D., CHRISTOPHER ([id number]), editor MUSE, DARRELL (SCOTT) ([id number]) on 03/18/2024 3:53:25 PM   Labs:  Recent Results (from the past 48 hours)  ECG 12 lead   Collection Time: 03/18/24 14:33  Result Value Ref Range   Ventricular Rate 56 BPM   Atrial Rate 56 BPM   PR Interval 206 ms   QRS Duration 82 ms   QT Interval 418 ms   QTC Calculation 403 ms   P Axis 65 degrees   R Axis 17 degrees   T Wave Axis 24 degrees  GLUCOSE POCT   Collection Time: 03/18/24 14:39  Result Value Ref Range   Glucose POCT 109 65 - 110 mg/dL  CBC w/auto diff   Collection Time: 03/18/24 14:40  Result Value Ref Range   WBC 7.5 4.5 - 11.0 K/uL   RBC 4.62 3.80 - 5.20 M/uL   Hemoglobin 13.8 11.7 - 16.1 g/dL   Hematocrit 57.5 64.9 - 47.0 %   MCV 91.8 81.0 - 102.0 fL   MCH 29.9 25.0 - 35.0 pg   MCHC 32.5 30.0 - 36.0 g/dL   RDW 87.1 88.4 - 85.3 %   MPV 10.1 No Established Reference Range fL   Neutrophils % 41.9 40.0 - 70.0 %   Lymphocytes % 47.7 (H) 15.0 - 45.0 %   Monocytes % 6.3 1.0 - 8.0 %   Eosinophils % 3.3 0.0 - 6.0 %   BASO % 0.8 0.0 - 2.0 %   MONO # 0.5 No Established Reference Range K/uL   EOS # 0.3 No Established Reference Range K/uL   BASO # 0.1 No Established Reference Range K/uL   Platelets 197 150 - 450 K/uL   nRBC 0 0 - 0 /100WBC   LYMPH # 3.6 No Established Reference Range K/uL   ABS NEUT #  3.13 No Established Reference Range K/uL  Comprehensive metabolic panel   Collection Time: 03/18/24 14:40  Result Value Ref Range   Glucose 101 74 - 109 mg/dL   Sodium 862  863 - 854 mMOL/L   Potassium 4.1 3.4 - 4.5 mMOL/L   Chloride 104 98 - 107 mMOL/L   CO2 23 22 - 29 mMOL/L   Anion Gap 14 9 - 18 mMOL/L   BUN 21 8 - 23 mg/dL   Creatinine 9.19 9.49 - 0.96 mg/dL   Calcium 89.6 (H) 8.8 - 10.2 mg/dL   Protein 6.9 6.0 - 8.0 g/dL   Albumin 4.0 3.5 - 5.2 g/dL   A/G Ratio 1.4 >=8.8   Globulin 2.9 1.5 - 3.8 g/dL   Total Bilirubin 0.3 <=8.9 mg/dL   AST 19 <=64 U/L   Alkaline Phosphatase 61 35 - 104 U/L   ALT 14 <=35 U/L   eGFR (2021) 80 >60 mL/min/1.73 m2  Magnesium   Collection Time: 03/18/24 14:40  Result Value Ref Range   Magnesium 2.0 1.6 - 2.4 mg/dL  Magnesium   Collection Time: 03/18/24 20:01  Result Value Ref Range   Magnesium 1.9 1.6 - 2.4 mg/dL  Phosphorus   Collection Time: 03/18/24 20:01  Result Value Ref Range   Phosphorus 2.7 2.5 - 4.5 mg/dL   DISCLAIMER: This chart was created using M-Modal dictation software. Efforts were made by me to ensure accuracy, however some errors may be present due to limitations of this technology and occasionally words are not transcribed as intended.  Electronically Signed:    Bernardino Hamlet, MD 03/18/24 2250   Bernardino Hamlet, MD 03/18/24 2251  Electronically signed by Bernardino Hamlet, MD at 03/18/2024 22:50 EDT Electronically signed by Bernardino Hamlet, MD at 03/18/2024 22:51 EDT

## (undated) NOTE — Assessment & Plan Note (Signed)
 Formatting of this note might be different from the original.  Problem: Cerebral Alteration Goal: Improve-Cerebral Alteration Description: Change in or modification of mental process Flowsheets (Taken 03/20/2024 1342) Cerebral Alteration Outcomes: Stabilizing  Electronically signed by Alonza OLEGARIO Pina, RN at 03/20/2024 13:42 EDT

## (undated) NOTE — Assessment & Plan Note (Signed)
 Formatting of this note might be different from the original.  Problem: Cerebral Alteration Goal: Improve-Cerebral Alteration Description: Change in or modification of mental process Flowsheets (Taken 03/19/2024 1300) Cerebral Alteration Outcomes: Stabilizing  Electronically signed by Buena Slovak, RN at 03/19/2024 13:00 EDT

## (undated) NOTE — Discharge Summary (Signed)
 Formatting of this note is different from the original. Images from the original note were not included.  [facility] DISCHARGE SUMMARY  Patient Name: Barbara Kim  Age/Sex:68 y.o.female  DOB: December 29, 1955  MRN#: [id number]  Discharging Physician: Nelson Kennel, MD Code Status: Full Code  Patient Care Team: Wolm LELON Scarlet, MD as PCP - General (Family Medicine)  Discharge Details  Admit date:       03/18/2024  Discharge date:    03/20/2024  Hospital LOS:  1 day(s)  DISCHARGE DIAGNOSES: Active Hospital Problems   Diagnosis Date Noted POA   Aneurysm of internal carotid artery (HHS-HCC) 03/20/2024 Yes   *Acute CVA (cerebrovascular accident) (CMS, HHS-HCC) 03/18/2024 Yes   Essential hypertension, benign 03/25/2014 Yes   Resolved Hospital Problems  No resolved problems to display.    Hospital Course   42 y.o.  female with PMHx of HTN, history of meningioma, s/p resection, thyroid  disorder, s/p resection presented with headache  Subacute CVA  Left lower extremity paresthesia  Essential hypertension  History of meningioma  Imagining revealed : CT head with surgical and chronic appearing changes MRI brain without contrast - subacute infarcts, MRI with contrast recommended MRI angiogram head - no evidence of intracranial arterial occlusion or aneurysm CT angio CT angio - 2 mm aneurysm of left ICA  Patient was monitored on tele. Given MRI findings, patient was started on statin and aspirin . BP was uncontrolled and Norvasc  dose was increased.  Echo was unremarkable with negative bubble study. Patient was evaluated by neurology and cardiology. 14 day heart monitor was placed.   At this time, patient is medically stable for discharge home with outpatient follow up with PCP and cardiology. Patient was instructed no work or gym until cleared by PCP, as per neurology recs.    Temp:  [36.4 C (97.5 F)-36.7 C (98.1 F)] 36.5 C (97.7 F) Heart Rate:  [48-60]  50 Resp:  [16-24] 24 BP: (130-169)/(59-80) 157/71 Physical Exam Gen: NAD, well appearing HEENT: NCAT, PERRL, EOMI, conjunctiva clear, moist MM, neck supple Heart: RRR, no murmurs appreciated Lungs: CTAB, no crackles, rales, rhonchi  Abd: Soft, non tender, not distended, no guarding, + BS Extremities: no edema, no deformity  MSK: normal ROM, non tender Neuro: CN grossly intact, sensation intact  Psych: AOx3, normal mood and effect Skin: no obvious rashes, lesions, bruising  Summary of Radiographic Findings: Echo Complete Result Date: 03/20/2024 Normal left ventricular size and function, ejection fraction 65%. Abnormal diastolic compliance. Trivial mitral and tricuspid regurgitation. Bubble study is negative for obvious right-to-left shunt.  CT angiogram head Per Protocol Result Date: 03/19/2024 [id number] HISTORY: Possible aneurysm COMPARISON: CT head 03/18/2024, MRI/MRA brain 03/18/2024 CT ANGIOGRAM HEAD TECHNIQUE: Multiple contiguous CT images of the brain without contrast were initially obtained. Brain CT angiogram with intravenous administration of Isovue -370 contrast agent was then acquired with 3D reconstructions performed on a separate independent workstation. FINDINGS: PRECONTRAST HEAD CT: There is no evidence of acute intracranial hemorrhage. There is no mass, significant mass effect, or abnormal extra-axial collection. The basal cisterns are patent. Redemonstrated left temporal encephalomalacia with postsurgical changes of the left pterional craniotomy. The ventricles are normal in size. The skull base and calvarium are normal. HEAD CTA: Atherosclerotic disease of the cavernous, and supraclinoid segments of the bilateral internal carotid arteries (ICAs) without significant stenosis. The petrous segment is also without significant stenosis. Tiny 2 mm aneurysm arising from the left plan/supraclinoid ICA, best seen on series 14, image 130. The bilateral ACA and right MCA are  patent. Portions  of the left MCA are again diminutive with patent collateral circulation to the distal MCA territories. Mild tortuosity of the vertebrobasilar system. The vertebral, basilar, and posterior cerebral arteries are patent.   Tiny 2 mm aneurysm arising from the left clinoid/supraclinoid ICA. Report Created in [facility] [employer]. Interpreted By: Bethann Ku                                 Electronically signed by: Glory Flaming, MD  03/19/2024 8:47 [id number] Report created in [facility] [employer].   MRI angiogram head without contrast Result Date: 03/18/2024 [id number] MRI ANGIOGRAM HEAD WO CONTRAST HISTORY: Stroke eval, left-sided weakness Comparison: MRI brain without contrast 03/18/2024, CT head 03/18/2024 TECHNIQUE: 3D time-of-flight MRA of the head without intravenous contrast administration. Source data and maximum intensity projections (MIPs) were reviewed. 3-D reconstructions were obtained. FINDINGS: Stenosis: Diminutive appearance of the left MCA from its origin with collateral circulation supplying the MCA territory. Aneurysm: Likely Tiny 2 mm infundibulum arising from the left cavernous ICA (series 2, image 96). There is an anterior communicating artery. The right posterior communicating artery is not well seen and likely hypoplastic. The left posterior communicating artery is not well seen and likely hypoplastic. Similar region of encephalomalacia within the left temporal lobe, better seen on same day MRI brain.   1.  No evidence of intracranial arterial occlusion or aneurysm. 2.  Diminutive appearance of the left MCA from its origin, with patent collateral circulation supplying the MCA territory. 3. Potential 2 mm infundibulum arising from the left cavernous ICA. Follow-up CTA recommended to exclude aneurysm. Report Created in [facility] [employer]. Interpreted By: Bethann Ku                                 Electronically signed by: Lonni Lek, MD  03/18/2024 7:04 [id number] Report created in [facility]  [employer].   Ultrasound carotid arteries bilateral Result Date: 03/18/2024 [id number] US  CAROTID BILATERAL HISTORY: Unilateral weakness COMPARISON: None. TECHNIQUE: A bilateral carotid ultrasound and doppler duplex study was performed.  Complete evaluation of the vertebral arteries was made. FINDINGS: Prominent plaque left carotid bulb. Velocities unremarkable. Antegrade flow is seen within the vertebral arteries bilaterally. Normal waveforms are seen throughout. RIGHT: PSV CCA 83; PSV ICA 109; SYS ICA/CCA 1.3; PDV CCA 20; PDV ICA 30;  DIAS ICA/CCA 1.6 LEFT:  PSV CCA 67; PSV ICA 92; SYS ICA/CCA 1.4; PDV CCA 14; PDV ICA 20; DIAS ICA/CCA 1.4 ** Measurements are in cm/sec.   1.  Prominent plaque left carotid bulb. However velocities are compatible with less than 50% stenosis involving each ICA Interpreted By: Lonni Lek, MD                                Electronically signed by: Lonni Lek, MD  03/18/2024 6:14 [id number] Report created in [facility] [employer].   MRI brain without contrast Result Date: 03/18/2024 [id number] MRI BRAIN WO CONTRAST HISTORY: Weakness left side COMPARISON: CT head 03/18/2024 TECHNIQUE: Multisequence MRI of the brain without intravenous contrast. FINDINGS: Tiny foci of restricted diffusion with corresponding T2 FLAIR hyperintensity involving the right thalamus and occipital lobe. Postsurgical changes of left frontotemporal craniotomy with left temporal encephalomalacia in the region of the resection cavity. There is mild edema surrounding the resection cavity. The ventricles are normal in size. Flow voids  of the larger intracranial vessels are present. Paranasal sinuses and mastoid air cells are predominantly clear. Marrow signal pattern is within normal limits.   Tiny foci of restricted diffusion with corresponding T2 FLAIR hyperintensity involving the right thalamus and posterior temporal, and occipital lobe. Findings are favored to reflect subacute infarcts.  However, given patient's history of prior brain tumor contrast-enhanced MRI could be obtained. Report Created in [facility] [employer]. Interpreted By: Mabel Belling                                Electronically signed by: Lonni Lek, MD  03/18/2024 6:10 [id number] Report created in [facility] [employer].   CT head without contrast Result Date: 03/18/2024 [id number] CT HEAD WO CONTRAST HISTORY: Pain/trauma COMPARISON: None. TECHNIQUE: CT scan of the head without IV contrast. Sagittal and coronal reformats obtained. FINDINGS: There is no evidence of intracranial hemorrhage, acute loss of distinction between gray and white matter, mass or mass effect. Encephalomalacia involving the temporal region all the left.. Surgical changes involving the frontal temporal region on the left extending to the left lateral orbit The basal cisterns are patent. The ventricles are normal in size. The density of the dural venous sinuses is normal. The skull base and calvarium are normal. Paranasal sinuses and mastoid air cells are predominantly clear. The visualized orbits are unremarkable.   1.  Surgical and chronic appearing changes with no obvious acute process Interpreted By: Lonni Lek, MD                                Electronically signed by: Lonni Lek, MD  03/18/2024 3:49 [id number] Report created in [facility] [employer].   Consultations: IP CONSULT TO NEUROLOGY IP CONSULT TO CARDIOLOGY  Procedures performed: No surgery found   Laboratory Lab Results  Component Value Date   HGB 12.9 03/19/2024   HCT 39.8 03/19/2024   PLT 188 03/19/2024   CHOL 199 03/19/2024   HDL 47 03/19/2024   LDLCALC 133 (H) 03/19/2024   BILITOT 0.2 03/19/2024   AST 13 03/19/2024   ALT 12 03/19/2024   NA 142 03/19/2024   K 3.9 03/19/2024   CL 108 (H) 03/19/2024   CO2 25 03/19/2024   BUN 22 03/19/2024   CREATININE 0.90 03/19/2024   HGBA1C 6.1 03/19/2024   GLU 118 (H) 03/19/2024   Cultures No results found  for: LABBLOO, CULBLOOD, CULURINE, URIN, LABURIN, CULTURECERV, CULTURECMV, CECST, CULEAR    Invalid input(s): ROUTINE                      Invalid input(s): RAPID NH NOSE CULTURE      Discharge Medications   Current Discharge Medication List    START taking these medications   Details  amLODIPine  (NORVASC ) 10 MG tablet Take 1 tablet (10 mg total) by mouth daily Qty: 30 tablet, Refills: 0   aspirin  81 MG EC tablet Take 1 tablet (81 mg total) by mouth daily Qty: 30 tablet, Refills: 0   atorvastatin (LIPITOR) 40 MG tablet Take 1 tablet (40 mg total) by mouth bedtime Qty: 30 tablet, Refills: 0     Post Hospital Care   No follow-ups on file.  Next Site of Care: After having reached maximal medical benefit of this acute hospitalization, patient was discharged to Home  Follow up:   Discharge Condition: good  Discharge Activity: no work or  gym until cleared by PCP  Discharge Diet: Diet Orders     Adult diet Type: Regular; Modifier: Heart healthy: PO starting at 06/22  1932   Recommendations to physicians for follow-up care: PP in 1-2 weeks, 14 day holter monitor   Time spent in discharge process:   40 minutes  Electronically signed:   Nelson Kennel, MD 03/20/24 1729  Electronically signed by Nelson Kennel, MD at 03/20/2024 17:29 EDT

---

## 1981-09-27 HISTORY — PX: TONSILLECTOMY: SUR1361

## 2000-03-18 ENCOUNTER — Other Ambulatory Visit: Admission: RE | Admit: 2000-03-18 | Discharge: 2000-03-18 | Payer: Self-pay | Admitting: Obstetrics and Gynecology

## 2002-01-24 ENCOUNTER — Other Ambulatory Visit: Admission: RE | Admit: 2002-01-24 | Discharge: 2002-01-24 | Payer: Self-pay | Admitting: Obstetrics and Gynecology

## 2003-06-07 ENCOUNTER — Other Ambulatory Visit: Admission: RE | Admit: 2003-06-07 | Discharge: 2003-06-07 | Payer: Self-pay | Admitting: Obstetrics and Gynecology

## 2005-07-28 ENCOUNTER — Other Ambulatory Visit: Admission: RE | Admit: 2005-07-28 | Discharge: 2005-07-28 | Payer: Self-pay | Admitting: Obstetrics and Gynecology

## 2014-03-16 ENCOUNTER — Emergency Department (HOSPITAL_COMMUNITY): Payer: 59

## 2014-03-16 ENCOUNTER — Encounter (HOSPITAL_COMMUNITY): Payer: Self-pay | Admitting: Emergency Medicine

## 2014-03-16 ENCOUNTER — Emergency Department (HOSPITAL_COMMUNITY)
Admission: EM | Admit: 2014-03-16 | Discharge: 2014-03-16 | Disposition: A | Discharge status: Home / Self Care | Payer: 59 | Attending: Emergency Medicine | Admitting: Emergency Medicine

## 2014-03-16 DIAGNOSIS — R079 Chest pain, unspecified: Secondary | ICD-10-CM | POA: Insufficient documentation

## 2014-03-16 LAB — I-STAT TROPONIN, ED: TROPONIN I, POC: 0.01 ng/mL (ref 0.00–0.08)

## 2014-03-16 LAB — BASIC METABOLIC PANEL
BUN: 22 mg/dL (ref 6–23)
CALCIUM: 9.2 mg/dL (ref 8.4–10.5)
CHLORIDE: 103 meq/L (ref 96–112)
CO2: 24 meq/L (ref 19–32)
CREATININE: 0.51 mg/dL (ref 0.50–1.10)
GFR calc Af Amer: >90 mL/min (ref >90)
GFR calc non Af Amer: >90 mL/min (ref >90)
Glucose, Bld: 146 mg/dL — ABNORMAL HIGH (ref 70–99)
Potassium: 3.9 mEq/L (ref 3.7–5.3)
Sodium: 140 mEq/L (ref 137–147)

## 2014-03-16 LAB — CBC
HEMATOCRIT: 44.1 % (ref 36.0–46.0)
Hemoglobin: 14.6 g/dL (ref 12.0–15.0)
MCH: 30 pg (ref 26.0–34.0)
MCHC: 33.1 g/dL (ref 30.0–36.0)
MCV: 90.6 fL (ref 78.0–100.0)
Platelets: 178 10*3/uL (ref 150–400)
RBC: 4.87 MIL/uL (ref 3.87–5.11)
RDW: 13 % (ref 11.5–15.5)
WBC: 7.5 10*3/uL (ref 4.0–10.5)

## 2014-03-16 LAB — PRO B NATRIURETIC PEPTIDE: Pro B Natriuretic peptide (BNP): 43.6 pg/mL (ref 0–125)

## 2014-03-16 IMAGING — CR DG CHEST 2V
2 series · 2 of 2 positions shown · non-contrast
Comparison: None

CLINICAL DATA: LEFT side chest pain and pressure for 4 days,
nonsmoker

EXAM:
CHEST  2 VIEW

[w chest pa]
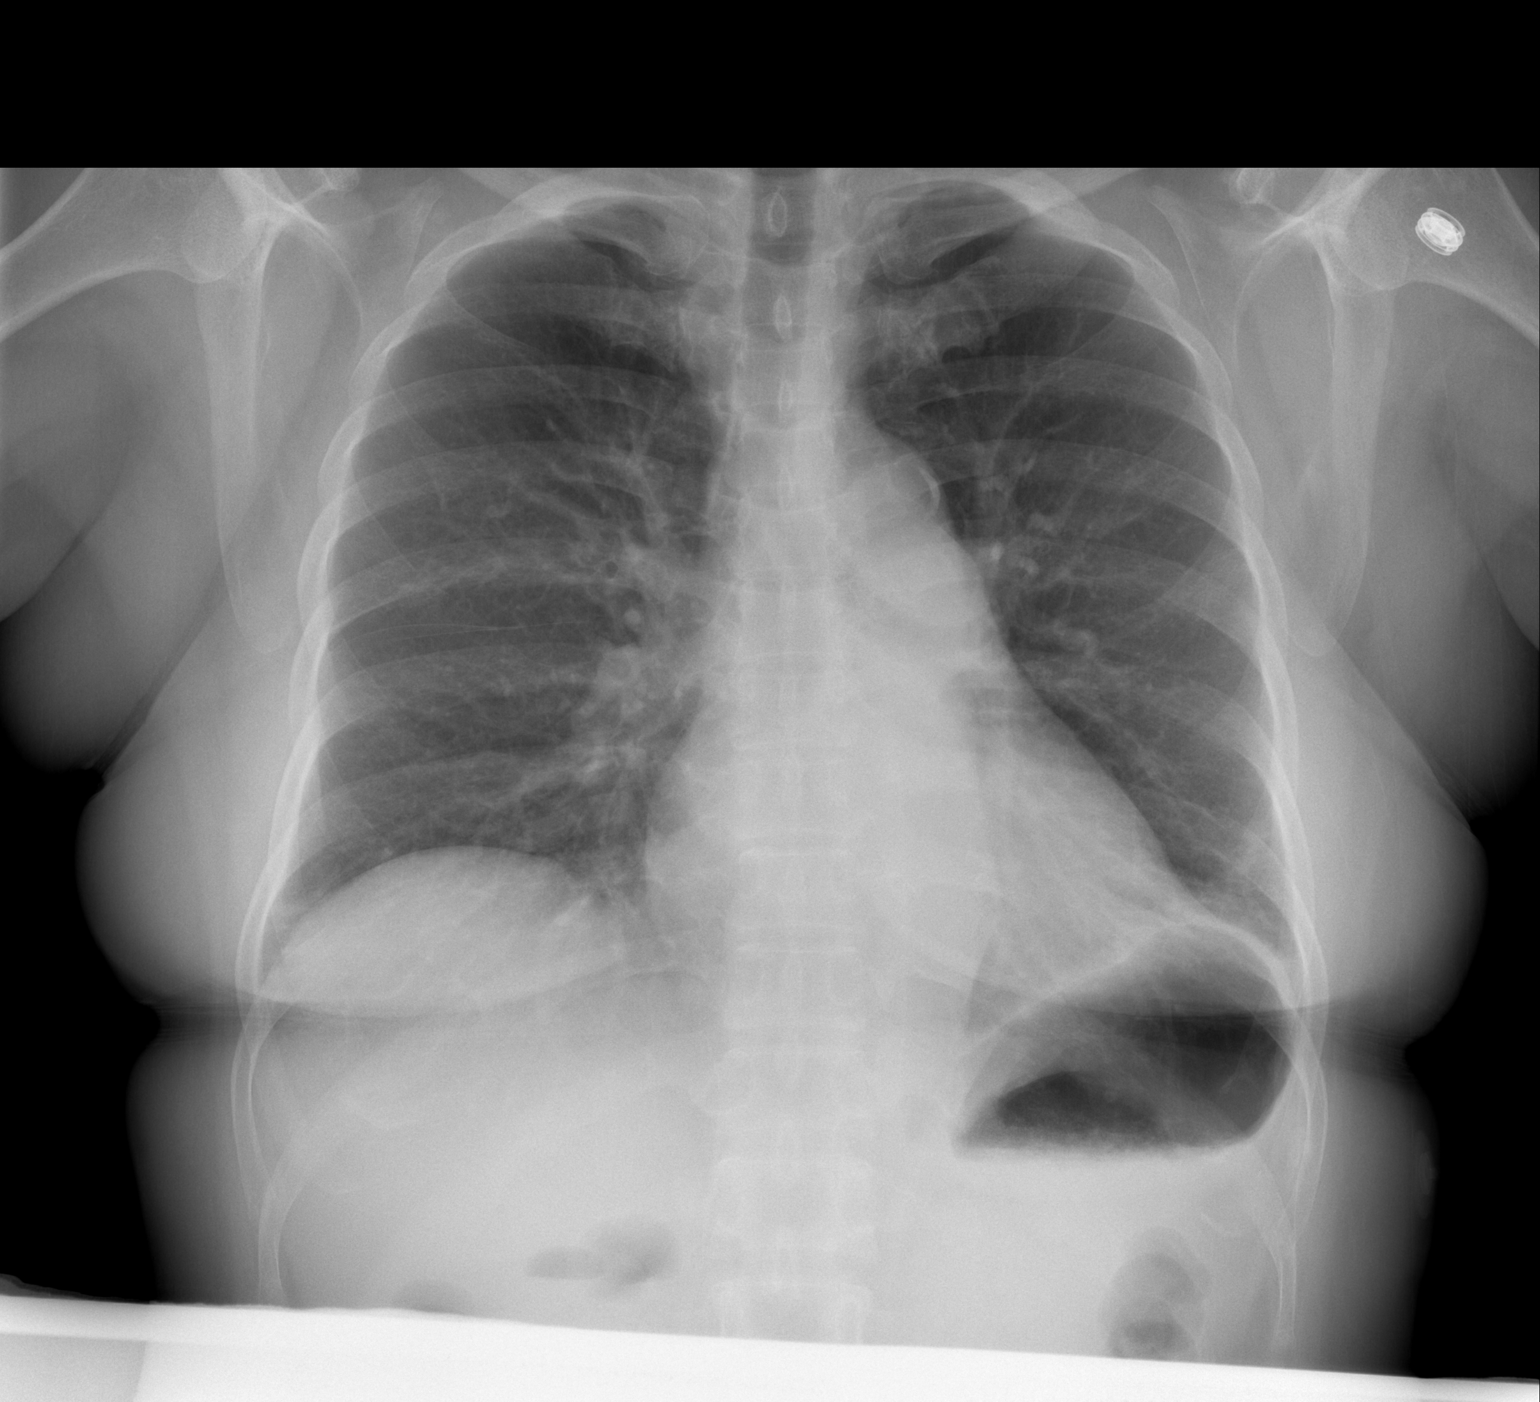

[w chest lat]
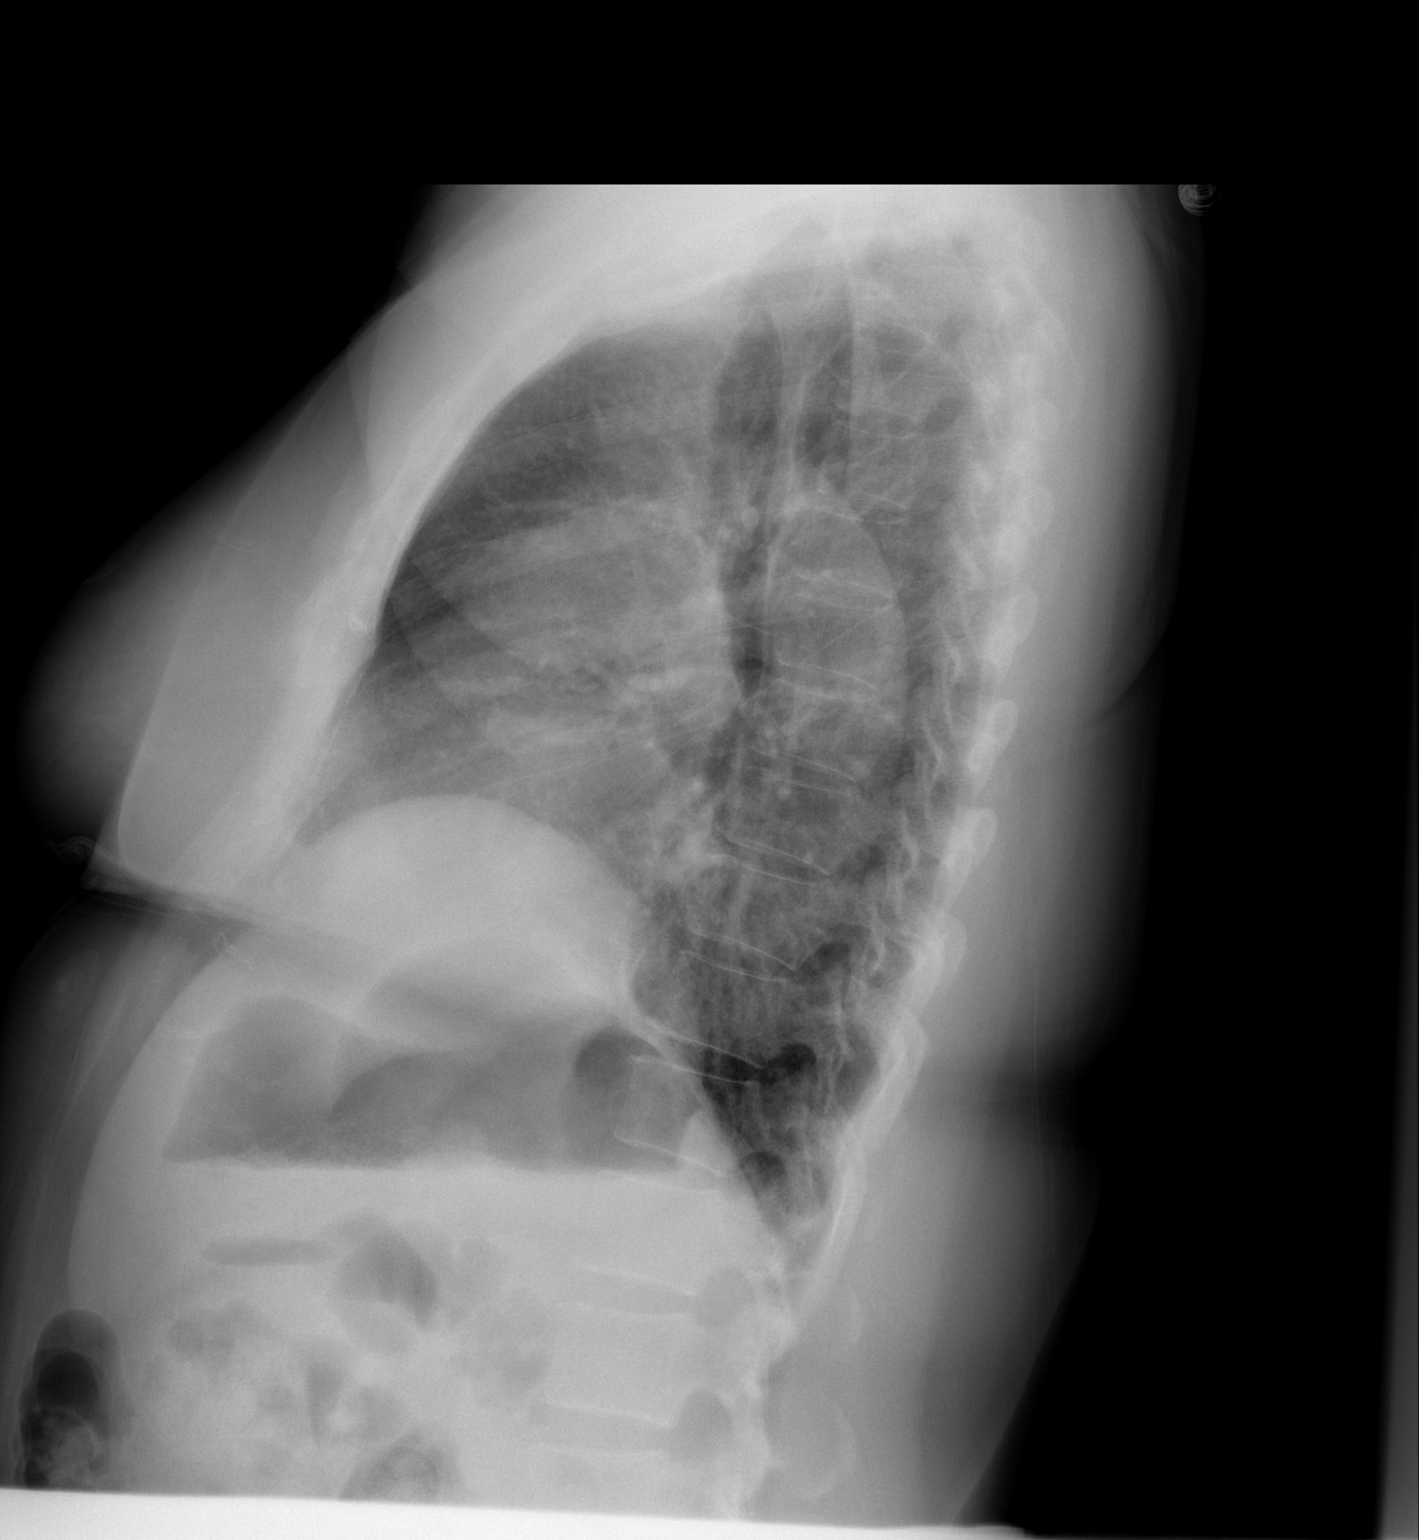

[2 of 2 positions shown; findings below may reference images not displayed]

FINDINGS: Upper normal heart size.

Tortuous aorta with atherosclerotic calcification.

Mediastinal contours and pulmonary vascularity normal.

Eventration anterior RIGHT diaphragm noted.

Mild atelectasis or infiltrate at lateral LEFT lung base.

Remaining lungs clear.

No pleural effusion or pneumothorax.
IMPRESSION: Mild atelectasis versus infiltrate at lateral LEFT lung base.

## 2014-03-16 MED ORDER — ASPIRIN 81 MG PO CHEW
243.0000 mg | CHEWABLE_TABLET | Freq: Once | ORAL | Status: AC
  Administered 2014-03-16: 243 mg via ORAL
  Filled 2014-03-16: qty 3

## 2014-03-16 MED ORDER — KETOROLAC TROMETHAMINE 30 MG/ML IJ SOLN
30.0000 mg | Freq: Once | INTRAMUSCULAR | Status: AC
  Administered 2014-03-16: 30 mg via INTRAVENOUS
  Filled 2014-03-16: qty 1

## 2014-03-16 NOTE — ED Notes (Signed)
Pt has returned from being out of the department; Ria Comment, RN at bedside and will place pt back on monitor when she finishes placing an IV in pt; family at bedside

## 2014-03-16 NOTE — ED Provider Notes (Signed)
CSN: [id number]     Arrival date & time 03/16/14  0354 History   First MD Initiated Contact with Patient 03/16/14 1000     Chief Complaint  Patient presents with  . Chest Pain     (Consider location/radiation/quality/duration/timing/severity/associated sxs/prior Treatment) HPI Comments: Patient is a 58 year old female with no significant past medical history. She presents today with complaints of discomfort that she describes as a pressure in the left anterior chest. This has been occurring intermittently for the past 4 days. She reports experiencing "a dozen or so" episodes during this period of time. This morning she was running with friends when she developed a recurrence. She reports feeling mildly short of breath but denies nausea or diaphoresis. She denies any radiation to the arm or jaw.  She has no prior cardiac history and denies having ever had a heart cath or stress test. She has no history of hypertension, high cholesterol, diabetes, and she does not smoke. She is uncertain as to her family history as she was adopted and has little knowledge of her biological parents.  Patient is a 58 y.o. female presenting with chest pain. The history is provided by the patient.  Chest Pain Pain location:  Substernal area Pain quality: pressure   Pain radiates to:  Does not radiate Pain radiates to the back: yes   Pain severity:  Moderate Onset quality:  Sudden Duration:  4 days Timing:  Intermittent Progression:  Worsening Chronicity:  New Relieved by:  Nothing Worsened by:  Nothing tried Ineffective treatments:  Aspirin   History reviewed. No pertinent past medical history. History reviewed. No pertinent past surgical history. History reviewed. No pertinent family history. History  Substance Use Topics  . Smoking status: Never Smoker   . Smokeless tobacco: Not on file  . Alcohol Use: No   OB History   Grav Para Term Preterm Abortions TAB SAB Ect Mult Living                  Review of Systems  Cardiovascular: Positive for chest pain.  All other systems reviewed and are negative.     Allergies  Review of patient's allergies indicates no known allergies.  Home Medications   Prior to Admission medications   Not on File   BP 207/86  Pulse 88  Temp(Src) 98.1 F (36.7 C) (Oral)  Resp 17  Ht 4\' 9"  (1.448 m)  Wt 120 lb (54.432 kg)  BMI 25.96 kg/m2  SpO2 97% Physical Exam  Nursing note and vitals reviewed. Constitutional: She is oriented to person, place, and time. She appears well-developed and well-nourished. No distress.  HENT:  Head: Normocephalic and atraumatic.  Neck: Normal range of motion. Neck supple.  Cardiovascular: Normal rate and regular rhythm.  Exam reveals no gallop and no friction rub.   No murmur heard. Pulmonary/Chest: Effort normal and breath sounds normal. No respiratory distress. She has no wheezes.  Abdominal: Soft. Bowel sounds are normal. She exhibits no distension. There is no tenderness.  Musculoskeletal: Normal range of motion.  Neurological: She is alert and oriented to person, place, and time.  Skin: Skin is warm and dry. She is not diaphoretic.    ED Course  Procedures (including critical care time) Labs Review Labs Reviewed  [place], ED    Imaging Review No results found.   Date: 03/17/2014  Rate: 87  Rhythm: normal sinus rhythm  QRS Axis: normal  Intervals: normal  ST/T Wave abnormalities: normal  Conduction Disutrbances:none  Narrative Interpretation:   Old EKG Reviewed: none available    MDM   Final diagnoses:  None    Patient is a 58 year old female who presents with intermittent episodes of chest pain for the past 3 or 4 days. This occurs with both rest and exertion. Today she was running with friends when her symptoms began. She was having active discomfort when she arrived which resolved with Toradol. Her workup reveals  negative troponin and unchanged EKG.  She was given the option of admission versus outpatient followup. It is her preference not to be admitted and would like to followup at [facility]. She was given the contact information to call on Monday to arrange a followup appointment to discuss a stress test as soon as possible. She understands to return if her symptoms were to worsen or change.    Veryl Speak, MD 03/17/14 [phone]

## 2014-03-16 NOTE — ED Notes (Signed)
Pt comfortable with discharge and follow up instructions. No prescriptions. Pt declines wheelchair, escorted to waiting area by this RN.

## 2014-03-16 NOTE — ED Notes (Signed)
She states shes had pressure on her chest and mild sob intermittently since Wednesday. Today while she was running she felt the pressure return. 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 She is A&Ox4, resp e/u

## 2014-03-16 NOTE — Discharge Instructions (Signed)
Followup with [facility] cardiology. The contact information has been provided on this discharge summary. Call to arrange this appointment as soon as possible.  Return to the emergency department if you develop worsening pain, difficulty breathing, or any other new and concerning symptoms.   Chest Pain (Nonspecific) It is often hard to give a specific diagnosis for the cause of chest pain. There is always a chance that your pain could be related to something serious, such as a heart attack or a blood clot in the lungs. You need to follow up with your health care provider for further evaluation. CAUSES   Heartburn.  Pneumonia or bronchitis.  Anxiety or stress.  Inflammation around your heart (pericarditis) or lung (pleuritis or pleurisy).  A blood clot in the lung.  A collapsed lung (pneumothorax). It can develop suddenly on its own (spontaneous pneumothorax) or from trauma to the chest.  Shingles infection (herpes zoster virus). The chest wall is composed of bones, muscles, and cartilage. Any of these can be the source of the pain.  The bones can be bruised by injury.  The muscles or cartilage can be strained by coughing or overwork.  The cartilage can be affected by inflammation and become sore (costochondritis). DIAGNOSIS  Lab tests or other studies may be needed to find the cause of your pain. Your health care provider may have you take a test called an ambulatory electrocardiogram (ECG). An ECG records your heartbeat patterns over a 24-hour period. You may also have other tests, such as:  Transthoracic echocardiogram (TTE). During echocardiography, sound waves are used to evaluate how blood flows through your heart.  Transesophageal echocardiogram (TEE).  Cardiac monitoring. This allows your health care provider to monitor your heart rate and rhythm in real time.  Holter monitor. This is a portable device that records your heartbeat and can help diagnose heart arrhythmias. It  allows your health care provider to track your heart activity for several days, if needed.  Stress tests by exercise or by giving medicine that makes the heart beat faster. TREATMENT   Treatment depends on what may be causing your chest pain. Treatment may include:  Acid blockers for heartburn.  Anti-inflammatory medicine.  Pain medicine for inflammatory conditions.  Antibiotics if an infection is present.  You may be advised to change lifestyle habits. This includes stopping smoking and avoiding alcohol, caffeine, and chocolate.  You may be advised to keep your head raised (elevated) when sleeping. This reduces the chance of acid going backward from your stomach into your esophagus. Most of the time, nonspecific chest pain will improve within 2-3 days with rest and mild pain medicine.  HOME CARE INSTRUCTIONS   If antibiotics were prescribed, take them as directed. Finish them even if you start to feel better.  For the next few days, avoid physical activities that bring on chest pain. Continue physical activities as directed.  Do not use any tobacco products, including cigarettes, chewing tobacco, or electronic cigarettes.  Avoid drinking alcohol.  Only take medicine as directed by your health care provider.  Follow your health care provider's suggestions for further testing if your chest pain does not go away.  Keep any follow-up appointments you made. If you do not go to an appointment, you could develop lasting (chronic) problems with pain. If there is any problem keeping an appointment, call to reschedule. SEEK MEDICAL CARE IF:   Your chest pain does not go away, even after treatment.  You have a rash with blisters on your  chest.  You have a fever. SEEK IMMEDIATE MEDICAL CARE IF:   You have increased chest pain or pain that spreads to your arm, neck, jaw, back, or abdomen.  You have shortness of breath.  You have an increasing cough, or you cough up blood.  You  have severe back or abdominal pain.  You feel nauseous or vomit.  You have severe weakness.  You faint.  You have chills. This is an emergency. Do not wait to see if the pain will go away. Get medical help at once. Call your local emergency services (911 in [place] Do not drive yourself to the hospital. MAKE SURE YOU:   Understand these instructions.  Will watch your condition.  Will get help right away if you are not doing well or get worse. Document Released: 06/23/2005 Document Revised: 09/18/2013 Document Reviewed: 04/18/2008 [facility] Patient Information 2015 [place], [place]. This information is not intended to replace advice given to you by your health care provider. Make sure you discuss any questions you have with your health care provider.

## 2014-03-18 ENCOUNTER — Telehealth (HOSPITAL_BASED_OUTPATIENT_CLINIC_OR_DEPARTMENT_OTHER): Payer: Self-pay | Admitting: Emergency Medicine

## 2014-03-25 ENCOUNTER — Ambulatory Visit (INDEPENDENT_AMBULATORY_CARE_PROVIDER_SITE_OTHER): Payer: 59 | Admitting: Family Medicine

## 2014-03-25 ENCOUNTER — Encounter: Payer: Self-pay | Admitting: Family Medicine

## 2014-03-25 VITALS — BP 150/98 | HR 58 | Temp 97.8°F | Ht <= 58 in | Wt 127.0 lb

## 2014-03-25 DIAGNOSIS — R0789 Other chest pain: Secondary | ICD-10-CM

## 2014-03-25 DIAGNOSIS — I10 Essential (primary) hypertension: Secondary | ICD-10-CM

## 2014-03-25 DIAGNOSIS — Z0000 Encounter for general adult medical examination without abnormal findings: Secondary | ICD-10-CM

## 2014-03-25 DIAGNOSIS — Z23 Encounter for immunization: Secondary | ICD-10-CM

## 2014-03-25 LAB — CBC WITH DIFFERENTIAL/PLATELET
BASOS ABS: 0 10*3/uL (ref 0.0–0.1)
Basophils Relative: 0.6 % (ref 0.0–3.0)
EOS PCT: 4.1 % (ref 0.0–5.0)
Eosinophils Absolute: 0.3 10*3/uL (ref 0.0–0.7)
HCT: 44.1 % (ref 36.0–46.0)
Hemoglobin: 14.9 g/dL (ref 12.0–15.0)
Lymphocytes Relative: 44.5 % (ref 12.0–46.0)
Lymphs Abs: 3.4 10*3/uL (ref 0.7–4.0)
MCHC: 33.8 g/dL (ref 30.0–36.0)
MCV: 91 fl (ref 78.0–100.0)
MONOS PCT: 6.4 % (ref 3.0–12.0)
Monocytes Absolute: 0.5 10*3/uL (ref 0.1–1.0)
NEUTROS PCT: 44.4 % (ref 43.0–77.0)
Neutro Abs: 3.4 10*3/uL (ref 1.4–7.7)
Platelets: 188 10*3/uL (ref 150.0–400.0)
RBC: 4.84 Mil/uL (ref 3.87–5.11)
RDW: 13.9 % (ref 11.5–15.5)
WBC: 7.6 10*3/uL (ref 4.0–10.5)

## 2014-03-25 LAB — LIPID PANEL
CHOL/HDL RATIO: 4
Cholesterol: 243 mg/dL — ABNORMAL HIGH (ref 0–200)
HDL: 63.4 mg/dL (ref >39.00)
LDL CALC: 163 mg/dL — AB (ref 0–99)
NONHDL: 179.6
Triglycerides: 82 mg/dL (ref 0.0–149.0)
VLDL: 16.4 mg/dL (ref 0.0–40.0)

## 2014-03-25 LAB — BASIC METABOLIC PANEL
BUN: 19 mg/dL (ref 6–23)
CHLORIDE: 102 meq/L (ref 96–112)
CO2: 28 meq/L (ref 19–32)
Calcium: 9.6 mg/dL (ref 8.4–10.5)
Creatinine, Ser: 0.6 mg/dL (ref 0.4–1.2)
GFR: 118.01 mL/min (ref >60.00)
Glucose, Bld: 111 mg/dL — ABNORMAL HIGH (ref 70–99)
POTASSIUM: 4.1 meq/L (ref 3.5–5.1)
SODIUM: 137 meq/L (ref 135–145)

## 2014-03-25 LAB — HEPATIC FUNCTION PANEL
ALK PHOS: 72 U/L (ref 39–117)
ALT: 20 U/L (ref 0–35)
AST: 21 U/L (ref 0–37)
Albumin: 4.3 g/dL (ref 3.5–5.2)
Bilirubin, Direct: 0.1 mg/dL (ref 0.0–0.3)
Total Bilirubin: 0.9 mg/dL (ref 0.2–1.2)
Total Protein: 7.7 g/dL (ref 6.0–8.3)

## 2014-03-25 LAB — POCT URINALYSIS DIPSTICK
BILIRUBIN UA: NEGATIVE
Glucose, UA: NEGATIVE
KETONES UA: NEGATIVE
LEUKOCYTES UA: NEGATIVE
NITRITE UA: NEGATIVE
Protein, UA: NEGATIVE
RBC UA: NEGATIVE
Spec Grav, UA: 1.015
Urobilinogen, UA: 0.2
pH, UA: 7

## 2014-03-25 LAB — TSH: TSH: 1.51 u[IU]/mL (ref 0.35–4.50)

## 2014-03-25 MED ORDER — AMLODIPINE BESYLATE 5 MG PO TABS: 5.0000 mg | ORAL_TABLET | Freq: Every day | ORAL | Status: DC

## 2014-03-25 NOTE — Patient Instructions (Signed)
Hypertension Hypertension, commonly called high blood pressure, is when the force of blood pumping through your arteries is too strong. Your arteries are the blood vessels that carry blood from your heart throughout your body. A blood pressure reading consists of a higher number over a lower number, such as 110/72. The higher number (systolic) is the pressure inside your arteries when your heart pumps. The lower number (diastolic) is the pressure inside your arteries when your heart relaxes. Ideally you want your blood pressure below 120/80. Hypertension forces your heart to work harder to pump blood. Your arteries may become narrow or stiff. Having hypertension puts you at risk for heart disease, stroke, and other problems.  RISK FACTORS Some risk factors for high blood pressure are controllable. Others are not.  Risk factors you cannot control include:   Race. You may be at higher risk if you are African American.  Age. Risk increases with age.  Gender. Men are at higher risk than women before age 45 years. After age 65, women are at higher risk than men. Risk factors you can control include:  Not getting enough exercise or physical activity.  Being overweight.  Getting too much fat, sugar, calories, or salt in your diet.  Drinking too much alcohol. SIGNS AND SYMPTOMS Hypertension does not usually cause signs or symptoms. Extremely high blood pressure (hypertensive crisis) may cause headache, anxiety, shortness of breath, and nosebleed. DIAGNOSIS  To check if you have hypertension, your health care provider will measure your blood pressure while you are seated, with your arm held at the level of your heart. It should be measured at least twice using the same arm. Certain conditions can cause a difference in blood pressure between your right and left arms. A blood pressure reading that is higher than normal on one occasion does not mean that you need treatment. If one blood pressure reading  is high, ask your health care provider about having it checked again. TREATMENT  Treating high blood pressure includes making lifestyle changes and possibly taking medication. Living a healthy lifestyle can help lower high blood pressure. You may need to change some of your habits. Lifestyle changes may include:  Following the DASH diet. This diet is high in fruits, vegetables, and whole grains. It is low in salt, red meat, and added sugars.  Getting at least 2 1/2 hours of brisk physical activity every week.  Losing weight if necessary.  Not smoking.  Limiting alcoholic beverages.  Learning ways to reduce stress. If lifestyle changes are not enough to get your blood pressure under control, your health care provider may prescribe medicine. You may need to take more than one. Work closely with your health care provider to understand the risks and benefits. HOME CARE INSTRUCTIONS  Have your blood pressure rechecked as directed by your health care provider.   Only take medicine as directed by your health care provider. Follow the directions carefully. Blood pressure medicines must be taken as prescribed. The medicine does not work as well when you skip doses. Skipping doses also puts you at risk for problems.   Do not smoke.   Monitor your blood pressure at home as directed by your health care provider. SEEK MEDICAL CARE IF:   You think you are having a reaction to medicines taken.  You have recurrent headaches or feel dizzy.  You have swelling in your ankles.  You have trouble with your vision. SEEK IMMEDIATE MEDICAL CARE IF:  You develop a severe headache or   confusion.  You have unusual weakness, numbness, or feel faint.  You have severe chest or abdominal pain.  You vomit repeatedly.  You have trouble breathing. MAKE SURE YOU:   Understand these instructions.  Will watch your condition.  Will get help right away if you are not doing well or get  worse. Document Released: 09/13/2005 Document Revised: 09/18/2013 Document Reviewed: 07/06/2013 Exit[employer] Patient Information 2015 [employer]. This information is not intended to replace advice given to you by your health care provider. Make sure you discuss any questions you have with your health care provider.  

## 2014-03-25 NOTE — Progress Notes (Signed)
Subjective:    Patient ID: Barbara Kim, female    DOB: [birth year], 58 y.o.   MRN: [id number]  HPI Patient seen to establish care and for well visit. She sees gynecologist regularly. She has no significant chronic medical problems. Never had screening colonoscopy. Nonsmoker. She had recent episode of somewhat atypical chest pain and was seen in emergency room on 03/16/2014. ED notes reviewed.  She described episode of chest pressure left anterior chest without radiation off and on for the past month or so.  Troponins were negative. A couple these episodes did occur with running and exercise. She's never had any cardiac history. No history of hypertension, hyperlipidemia, diabetes, or smoking. She is adopted so family history is unknown.  She's been exercising the past few days without any recurrent symptoms. There had been suggestion to schedule nuclear stress test. Of note, patient had mention of blood pressure 207/86 per ED notes. She was not placed on any blood pressure medication. She states her gynecologist as mentioned couple times and her blood pressures been moderately elevated but she's never been treated. Denies any headaches. No fatigue issues.  Past Medical History  Diagnosis Date  . Chicken pox    Past Surgical History  Procedure Laterality Date  . Tonsillectomy  1983    reports that she has never smoked. She does not have any smokeless tobacco history on file. She reports that she does not drink alcohol or use illicit drugs. family history is not on file. She was adopted. No Known Allergies    Review of Systems  Constitutional: Negative for fever, activity change, appetite change, fatigue and unexpected weight change.  HENT: Negative for ear pain, hearing loss, sore throat and trouble swallowing.   Eyes: Negative for visual disturbance.  Respiratory: Negative for cough and shortness of breath.   Cardiovascular: Positive for chest pain. Negative for palpitations and leg  swelling.  Gastrointestinal: Negative for abdominal pain, diarrhea, constipation and blood in stool.  Endocrine: Negative for polydipsia and polyuria.  Genitourinary: Negative for dysuria and hematuria.  Musculoskeletal: Negative for arthralgias, back pain and myalgias.  Skin: Negative for rash.  Neurological: Negative for dizziness, syncope and headaches.  Hematological: Negative for adenopathy.  Psychiatric/Behavioral: Negative for confusion and dysphoric mood.       Objective:   Physical Exam  Constitutional: She is oriented to person, place, and time. She appears well-developed and well-nourished.  HENT:  Head: Normocephalic and atraumatic.  Eyes: EOM are normal. Pupils are equal, round, and reactive to light.  Neck: Normal range of motion. Neck supple. No thyromegaly present.  Cardiovascular: Normal rate, regular rhythm and normal heart sounds.   No murmur heard. Pulmonary/Chest: Breath sounds normal. No respiratory distress. She has no wheezes. She has no rales.  Abdominal: Soft. Bowel sounds are normal. She exhibits no distension and no mass. There is no tenderness. There is no rebound and no guarding.  Genitourinary:  Per gyn   Musculoskeletal: Normal range of motion. She exhibits no edema.  Lymphadenopathy:    She has no cervical adenopathy.  Neurological: She is alert and oriented to person, place, and time. She displays normal reflexes. No cranial nerve deficit.  Skin: No rash noted.  Psychiatric: She has a normal mood and affect. Her behavior is normal. Judgment and thought content normal.          Assessment & Plan:  Complete physical. Obtain screening lab work. Set up screening colonoscopy. Tetanus booster given. Continue GYN follow up  Hypertension which  has been untreated to this point. She has severe elevation today left arm seated 192/100 and right arm seated 200/94. Start amlodipine 5 mg once daily. Reduce sodium intake. Reassess within 2-3  weeks  Atypical chest pain. Overall appears to be fairly low risk. Family history unknown as she is adopted. Set up nuclear stress test to further evaluate.

## 2014-03-25 NOTE — Progress Notes (Signed)
Pre visit review using our clinic review tool, if applicable. No additional management support is needed unless otherwise documented below in the visit note. 

## 2014-03-26 ENCOUNTER — Telehealth: Payer: Self-pay | Admitting: Family Medicine

## 2014-03-26 NOTE — Telephone Encounter (Signed)
Relevant patient education mailed to patient.  

## 2014-04-01 ENCOUNTER — Ambulatory Visit (HOSPITAL_COMMUNITY): Payer: 59 | Attending: Family Medicine | Admitting: Radiology

## 2014-04-01 VITALS — BP 194/91 | HR 57 | Ht 59.0 in | Wt 126.0 lb

## 2014-04-01 DIAGNOSIS — I10 Essential (primary) hypertension: Secondary | ICD-10-CM | POA: Insufficient documentation

## 2014-04-01 DIAGNOSIS — R0789 Other chest pain: Secondary | ICD-10-CM | POA: Insufficient documentation

## 2014-04-01 DIAGNOSIS — R079 Chest pain, unspecified: Secondary | ICD-10-CM

## 2014-04-01 MED ORDER — TECHNETIUM TC 99M SESTAMIBI GENERIC - CARDIOLITE
33.0000 | Freq: Once | INTRAVENOUS | Status: AC | PRN
  Administered 2014-04-01: 33 via INTRAVENOUS

## 2014-04-01 MED ORDER — TECHNETIUM TC 99M SESTAMIBI GENERIC - CARDIOLITE
11.0000 | Freq: Once | INTRAVENOUS | Status: AC | PRN
  Administered 2014-04-01: 11 via INTRAVENOUS

## 2014-04-01 NOTE — Progress Notes (Signed)
[facility] [address] [place], [place] [address] [phone]    Cardiology Nuclear Med Study  Barbara Kim is a 58 y.o. female     MRN : [id number]     DOB: 08-18-1956  Procedure Date: 04/01/2014  Nuclear Med Background Indication for Stress Test:  Evaluation for Ischemia and Post Hospital:03/16/14 Chest Pain and (-) troponins History:  No known CAD Cardiac Risk Factors: Hypertension  Symptoms:  Chest Pain (last date of chest discomfort was June 20th)   Nuclear Pre-Procedure Caffeine/Decaff Intake:  7:30pm NPO After: 7:30pm   Lungs:  clear O2 Sat: 94% on room air. IV 0.9% NS with Angio Cath:  22g  IV Site: R Hand  IV Started by:  Matilde Haymaker, RN  Chest Size (in):  34 Cup Size: A  Height: 4\' 11"  (1.499 m)  Weight:  126 lb (57.153 kg)  BMI:  Body mass index is 25.44 kg/(m^2). Tech Comments:  n/a    Nuclear Med Study 1 or 2 day study: 1 day  Stress Test Type:  Stress  Reading MD: n/a  Order Authorizing Provider:  Earnest Bailey  Resting Radionuclide: Technetium 82m Sestamibi  Resting Radionuclide Dose: 11.0 mCi   Stress Radionuclide:  Technetium 63m Sestamibi  Stress Radionuclide Dose: 33.0 mCi           Stress Protocol Rest HR: 57 Stress HR: 142  Rest BP: 194/91 Stress BP: 219/82  Exercise Time (min): 9:00 METS: 10.1           Dose of Adenosine (mg):  n/a Dose of Lexiscan: n/a mg  Dose of Atropine (mg): n/a Dose of Dobutamine: n/a mcg/kg/min (at max HR)  Stress Test Technologist: Glade Lloyd, BS-ES  Nuclear Technologist:  Vedia Pereyra, CNMT     Rest Procedure:  Myocardial perfusion imaging was performed at rest 45 minutes following the intravenous administration of Technetium 26m Sestamibi. Rest ECG: NSR - Normal EKG  Stress Procedure:  The patient exercised on the treadmill utilizing the Bruce Protocol for 9:00 minutes. The patient stopped due to fatigue and denied any chest pain.  Technetium 69m Sestamibi was  injected at peak exercise and myocardial perfusion imaging was performed after a brief delay. Stress ECG: No significant change from baseline ECG  QPS Raw Data Images:  Normal; no motion artifact; normal heart/lung ratio. Stress Images:  Normal homogeneous uptake in all areas of the myocardium. Rest Images:  Normal homogeneous uptake in all areas of the myocardium. Subtraction (SDS):  No evidence of ischemia. Transient Ischemic Dilatation (Normal <1.22):  0.93 Lung/Heart Ratio (Normal <0.45):  0.36  Quantitative Gated Spect Images QGS EDV:  73 ml QGS ESV:  19 ml  Impression Exercise Capacity:  Good exercise capacity. BP Response:  Normal blood pressure response. Clinical Symptoms:  No significant symptoms noted. ECG Impression:  No significant ST segment change suggestive of ischemia. Comparison with Prior Nuclear Study: No previous nuclear study performed  Overall Impression:  Normal stress nuclear study.  LV Ejection Fraction: 75%.  LV Wall Motion:  NL LV Function; NL Wall Motion  Jenkins [place]

## 2014-04-15 ENCOUNTER — Ambulatory Visit (INDEPENDENT_AMBULATORY_CARE_PROVIDER_SITE_OTHER): Payer: 59 | Admitting: Family Medicine

## 2014-04-15 ENCOUNTER — Encounter: Payer: Self-pay | Admitting: Family Medicine

## 2014-04-15 VITALS — BP 132/70 | HR 61 | Wt 126.0 lb

## 2014-04-15 DIAGNOSIS — I10 Essential (primary) hypertension: Secondary | ICD-10-CM

## 2014-04-15 NOTE — Progress Notes (Signed)
Pre visit review using our clinic review tool, if applicable. No additional management support is needed unless otherwise documented below in the visit note. 

## 2014-04-15 NOTE — Progress Notes (Signed)
   Subjective:    Patient ID: Barbara Kim, female    DOB: 11-21-1955, 58 y.o.   MRN: [id number]  HPI Followup hypertension. We started amlodipine 5 mg daily. Recent nuclear stress test no ischemia. She is training for marathon. Exercising regularly. No side effects from medication. Compliant with therapy. Not monitoring home blood pressure. No headaches or peripheral edema. No dizziness. No chest pains.  Past Medical History  Diagnosis Date  . Chicken pox    Past Surgical History  Procedure Laterality Date  . Tonsillectomy  1983    reports that she has never smoked. She does not have any smokeless tobacco history on file. She reports that she does not drink alcohol or use illicit drugs. family history is not on file. She was adopted. No Known Allergies    Review of Systems  Constitutional: Negative for fatigue.  Eyes: Negative for visual disturbance.  Respiratory: Negative for cough, chest tightness, shortness of breath and wheezing.   Cardiovascular: Negative for chest pain, palpitations and leg swelling.  Endocrine: Negative for polydipsia and polyuria.  Neurological: Negative for dizziness, seizures, syncope, weakness, light-headedness and headaches.       Objective:   Physical Exam  Constitutional: She appears well-developed and well-nourished.  Neck: Neck supple. No thyromegaly present.  Cardiovascular: Normal rate and regular rhythm.  Exam reveals no gallop.   No murmur heard. Pulmonary/Chest: Effort normal and breath sounds normal. No respiratory distress. She has no wheezes. She has no rales.  Musculoskeletal: She exhibits no edema.          Assessment & Plan:  Hypertension. Improved. Continue amlodipine 5 mg daily. Continue regular aerobic activity and home blood pressure monitoring. Routine followup 6 months

## 2014-04-15 NOTE — Patient Instructions (Signed)
Continue to monitor blood pressure and be in touch if consistently > 140/90 

## 2014-04-29 ENCOUNTER — Encounter: Payer: Self-pay | Admitting: Family Medicine

## 2014-05-22 ENCOUNTER — Encounter: Payer: 59 | Admitting: Interventional Cardiology

## 2015-02-19 ENCOUNTER — Encounter: Payer: Self-pay | Admitting: *Deleted

## 2015-03-31 ENCOUNTER — Other Ambulatory Visit: Payer: Self-pay | Admitting: Family Medicine

## 2015-07-21 ENCOUNTER — Other Ambulatory Visit: Payer: Self-pay | Admitting: Family Medicine

## 2015-08-18 ENCOUNTER — Ambulatory Visit (INDEPENDENT_AMBULATORY_CARE_PROVIDER_SITE_OTHER): Payer: Commercial Managed Care - PPO | Admitting: Family Medicine

## 2015-08-18 ENCOUNTER — Encounter: Payer: Self-pay | Admitting: Family Medicine

## 2015-08-18 VITALS — BP 142/70 | HR 59 | Temp 97.3°F | Resp 14 | Ht <= 58 in | Wt 121.7 lb

## 2015-08-18 DIAGNOSIS — Z0000 Encounter for general adult medical examination without abnormal findings: Secondary | ICD-10-CM

## 2015-08-18 LAB — VITAMIN D 25 HYDROXY (VIT D DEFICIENCY, FRACTURES): VITD: 20.3 ng/mL — ABNORMAL LOW (ref 30.00–100.00)

## 2015-08-18 LAB — BASIC METABOLIC PANEL
BUN: 14 mg/dL (ref 6–23)
CALCIUM: 9.4 mg/dL (ref 8.4–10.5)
CO2: 30 mEq/L (ref 19–32)
Chloride: 105 mEq/L (ref 96–112)
Creatinine, Ser: 0.5 mg/dL (ref 0.40–1.20)
GFR: 133.85 mL/min (ref >60.00)
Glucose, Bld: 90 mg/dL (ref 70–99)
Potassium: 4.4 mEq/L (ref 3.5–5.1)
Sodium: 140 mEq/L (ref 135–145)

## 2015-08-18 LAB — HEPATIC FUNCTION PANEL
ALK PHOS: 71 U/L (ref 39–117)
ALT: 16 U/L (ref 0–35)
AST: 14 U/L (ref 0–37)
Albumin: 4.2 g/dL (ref 3.5–5.2)
Bilirubin, Direct: 0.1 mg/dL (ref 0.0–0.3)
TOTAL PROTEIN: 7 g/dL (ref 6.0–8.3)
Total Bilirubin: 0.8 mg/dL (ref 0.2–1.2)

## 2015-08-18 LAB — LIPID PANEL
Cholesterol: 244 mg/dL — ABNORMAL HIGH (ref 0–200)
HDL: 59.8 mg/dL (ref >39.00)
LDL Cholesterol: 162 mg/dL — ABNORMAL HIGH (ref 0–99)
NONHDL: 184.35
TRIGLYCERIDES: 110 mg/dL (ref 0.0–149.0)
Total CHOL/HDL Ratio: 4
VLDL: 22 mg/dL (ref 0.0–40.0)

## 2015-08-18 LAB — TSH: TSH: 1.13 u[IU]/mL (ref 0.35–4.50)

## 2015-08-18 MED ORDER — AMLODIPINE BESYLATE 5 MG PO TABS: 5.0000 mg | ORAL_TABLET | Freq: Every day | ORAL | Status: DC

## 2015-08-18 NOTE — Progress Notes (Signed)
   Subjective:    Patient ID: Barbara Kim, female    DOB: December 08, 1955, 59 y.o.   MRN: [id number]  HPI Patient here for complete physical. She plans to see gynecologist soon for GYN concerns. She plans to get mammograms through their office. She has hypertension treated with amlodipine. She was completely out of medication until a few weeks ago and has still been occasionally poor compliance with taking medication. She exercises with running a few times a week. She has lost about 6 pounds since last year and hopes to lose more. She declines flu vaccine.    She takes amlodipine but no other prescription medications. She was scheduled previously for colonoscopy but never went. She is willing to be schedule this time.  Past Medical History  Diagnosis Date  . Chicken pox    Past Surgical History  Procedure Laterality Date  . Tonsillectomy  1983    reports that she has never smoked. She does not have any smokeless tobacco history on file. She reports that she does not drink alcohol or use illicit drugs. family history is not on file. She was adopted. No Known Allergies    Review of Systems  Constitutional: Negative for fever, activity change, appetite change, fatigue and unexpected weight change.  HENT: Negative for ear pain, hearing loss, sore throat and trouble swallowing.   Eyes: Negative for visual disturbance.  Respiratory: Negative for cough and shortness of breath.   Cardiovascular: Negative for chest pain and palpitations.  Gastrointestinal: Negative for abdominal pain, diarrhea, constipation and blood in stool.  Endocrine: Negative for polydipsia and polyuria.  Genitourinary: Negative for dysuria and hematuria.  Musculoskeletal: Negative for myalgias, back pain and arthralgias.  Skin: Negative for rash.  Neurological: Negative for dizziness, syncope and headaches.  Hematological: Negative for adenopathy.  Psychiatric/Behavioral: Negative for confusion and dysphoric mood.        Objective:   Physical Exam  Constitutional: She is oriented to person, place, and time. She appears well-developed and well-nourished.  HENT:  Head: Normocephalic and atraumatic.  Eyes: EOM are normal. Pupils are equal, round, and reactive to light.  Neck: Normal range of motion. Neck supple. No thyromegaly present.  Cardiovascular: Normal rate, regular rhythm and normal heart sounds.   No murmur heard. Pulmonary/Chest: Breath sounds normal. No respiratory distress. She has no wheezes. She has no rales.  Abdominal: Soft. Bowel sounds are normal. She exhibits no distension and no mass. There is no tenderness. There is no rebound and no guarding.  Genitourinary:  Per GYN  Musculoskeletal: Normal range of motion. She exhibits no edema.  Lymphadenopathy:    She has no cervical adenopathy.  Neurological: She is alert and oriented to person, place, and time. She displays normal reflexes. No cranial nerve deficit.  Skin: No rash noted.  Psychiatric: She has a normal mood and affect. Her behavior is normal. Judgment and thought content normal.          Assessment & Plan:  Complete physical. Patient plans to see GYN soon. Set up screening colonoscopy. Obtain screening lab work including vitamin D level. We discussed importance of weight control and she will continue with her regular exercise regimen. We discussed dietary factors. Flu vaccine offered and declined.

## 2015-08-18 NOTE — Patient Instructions (Signed)
Continue with weight loss efforts Monitor blood pressure and be in touch if consistently 140/90.

## 2015-08-19 LAB — CBC WITH DIFFERENTIAL/PLATELET
Basophils Absolute: 0.1 10*3/uL (ref 0.0–0.1)
Basophils Relative: 1 % (ref 0.0–3.0)
EOS PCT: 4.1 % (ref 0.0–5.0)
Eosinophils Absolute: 0.2 10*3/uL (ref 0.0–0.7)
HCT: 44.8 % (ref 36.0–46.0)
Hemoglobin: 15 g/dL (ref 12.0–15.0)
Lymphocytes Relative: 50.8 % — ABNORMAL HIGH (ref 12.0–46.0)
Lymphs Abs: 2.7 10*3/uL (ref 0.7–4.0)
MCHC: 33.5 g/dL (ref 30.0–36.0)
MCV: 91.4 fl (ref 78.0–100.0)
MONOS PCT: 5.6 % (ref 3.0–12.0)
Monocytes Absolute: 0.3 10*3/uL (ref 0.1–1.0)
NEUTROS ABS: 2.1 10*3/uL (ref 1.4–7.7)
Neutrophils Relative %: 38.5 % — ABNORMAL LOW (ref 43.0–77.0)
Platelets: 165 10*3/uL (ref 150.0–400.0)
RBC: 4.91 Mil/uL (ref 3.87–5.11)
RDW: 13.6 % (ref 11.5–15.5)
WBC: 5.4 10*3/uL (ref 4.0–10.5)

## 2015-08-19 MED ORDER — VITAMIN D (ERGOCALCIFEROL) 1.25 MG (50000 UNIT) PO CAPS: 50000.0000 [IU] | ORAL_CAPSULE | ORAL | Status: DC

## 2015-08-19 NOTE — Addendum Note (Signed)
Addended by: Elio Forget on: 08/19/2015 03:42 PM   Modules accepted: Orders

## 2015-11-11 ENCOUNTER — Other Ambulatory Visit: Payer: Self-pay | Admitting: Family Medicine

## 2015-11-11 MED ORDER — AMLODIPINE BESYLATE 5 MG PO TABS: 5.0000 mg | ORAL_TABLET | Freq: Every day | ORAL | Status: DC

## 2015-11-11 MED ORDER — VITAMIN D (ERGOCALCIFEROL) 1.25 MG (50000 UNIT) PO CAPS: 50000.0000 [IU] | ORAL_CAPSULE | ORAL | Status: DC

## 2015-11-12 ENCOUNTER — Other Ambulatory Visit: Payer: Self-pay | Admitting: Family Medicine

## 2016-06-03 ENCOUNTER — Other Ambulatory Visit: Payer: Self-pay | Admitting: Family Medicine

## 2016-06-03 NOTE — Telephone Encounter (Signed)
Rx refill sent to pharmacy. 

## 2016-06-29 ENCOUNTER — Other Ambulatory Visit: Payer: Self-pay | Admitting: Family Medicine

## 2016-09-02 ENCOUNTER — Other Ambulatory Visit: Payer: Self-pay | Admitting: Family Medicine

## 2016-12-03 ENCOUNTER — Ambulatory Visit: Payer: Commercial Managed Care - PPO | Admitting: Family Medicine

## 2017-06-16 ENCOUNTER — Encounter: Payer: Self-pay | Admitting: Family Medicine

## 2017-09-10 ENCOUNTER — Other Ambulatory Visit: Payer: Self-pay | Admitting: Family Medicine

## 2018-03-24 ENCOUNTER — Other Ambulatory Visit: Payer: Self-pay | Admitting: Family Medicine

## 2018-06-02 ENCOUNTER — Ambulatory Visit (INDEPENDENT_AMBULATORY_CARE_PROVIDER_SITE_OTHER): Payer: Self-pay | Admitting: Family Medicine

## 2018-06-02 ENCOUNTER — Encounter: Payer: Self-pay | Admitting: Family Medicine

## 2018-06-02 VITALS — BP 154/72 | HR 58 | Temp 98.4°F | Wt 119.3 lb

## 2018-06-02 DIAGNOSIS — I10 Essential (primary) hypertension: Secondary | ICD-10-CM

## 2018-06-02 MED ORDER — AMLODIPINE BESYLATE 5 MG PO TABS: 5.0000 mg | ORAL_TABLET | Freq: Every day | ORAL | 3 refills | Status: DC

## 2018-06-02 NOTE — Progress Notes (Signed)
  Subjective:     Patient ID: Barbara Kim, female   DOB: [birth year], 62 y.o.   MRN: [id number]  HPI  Patient here for follow-up hypertension. She is on amlodipine 5 mg daily but ran out several months ago but has not been taking this basically for 3 months. She denies any headaches. No chest pains. She runs some for exercise but somewhat inconsistently. She had to run a marathon this year. Nonsmoker.  Past Medical History:  Diagnosis Date  . Chicken pox    Past Surgical History:  Procedure Laterality Date  . TONSILLECTOMY  1983    reports that she has never smoked. She has never used smokeless tobacco. She reports that she does not drink alcohol or use drugs. family history is not on file. She was adopted. No Known Allergies  Review of Systems  Constitutional: Negative for fatigue.  Eyes: Negative for visual disturbance.  Respiratory: Negative for cough, chest tightness, shortness of breath and wheezing.   Cardiovascular: Negative for chest pain, palpitations and leg swelling.  Neurological: Negative for dizziness, seizures, syncope, weakness, light-headedness and headaches.       Objective:   Physical Exam  Constitutional: She appears well-developed and well-nourished.  Eyes: Pupils are equal, round, and reactive to light.  Neck: Neck supple. No JVD present. No thyromegaly present.  Cardiovascular: Normal rate and regular rhythm. Exam reveals no gallop.  Pulmonary/Chest: Effort normal and breath sounds normal. No respiratory distress. She has no wheezes. She has no rales.  Musculoskeletal: She exhibits no edema.  Neurological: She is alert.       Assessment:     Hypertension. Poorly controlled but patient currently off medication    Plan:     -refill amlodipine for one year -Start back more consistent exercise -Schedule complete physical for this fall  Eulas Post MD [facility]

## 2018-06-02 NOTE — Patient Instructions (Signed)
Set up complete physical.   

## 2019-05-31 ENCOUNTER — Other Ambulatory Visit: Payer: Self-pay | Admitting: Family Medicine

## 2019-06-22 ENCOUNTER — Encounter: Payer: Self-pay | Admitting: Family Medicine

## 2019-06-22 ENCOUNTER — Ambulatory Visit (INDEPENDENT_AMBULATORY_CARE_PROVIDER_SITE_OTHER): Payer: BC Managed Care – PPO | Admitting: Family Medicine

## 2019-06-22 ENCOUNTER — Other Ambulatory Visit: Payer: Self-pay

## 2019-06-22 VITALS — BP 132/84 | HR 56 | Temp 98.2°F | Ht <= 58 in | Wt 120.9 lb

## 2019-06-22 DIAGNOSIS — Z0000 Encounter for general adult medical examination without abnormal findings: Secondary | ICD-10-CM

## 2019-06-22 DIAGNOSIS — Z23 Encounter for immunization: Secondary | ICD-10-CM | POA: Diagnosis not present

## 2019-06-22 LAB — LIPID PANEL
Cholesterol: 234 mg/dL — ABNORMAL HIGH (ref 0–200)
HDL: 59.5 mg/dL (ref >39.00)
LDL Cholesterol: 153 mg/dL — ABNORMAL HIGH (ref 0–99)
NonHDL: 174.21
Total CHOL/HDL Ratio: 4
Triglycerides: 105 mg/dL (ref 0.0–149.0)
VLDL: 21 mg/dL (ref 0.0–40.0)

## 2019-06-22 LAB — CBC WITH DIFFERENTIAL/PLATELET
Basophils Absolute: 0.1 10*3/uL (ref 0.0–0.1)
Basophils Relative: 1 % (ref 0.0–3.0)
Eosinophils Absolute: 0.2 10*3/uL (ref 0.0–0.7)
Eosinophils Relative: 4 % (ref 0.0–5.0)
HCT: 45.8 % (ref 36.0–46.0)
Hemoglobin: 15.1 g/dL — ABNORMAL HIGH (ref 12.0–15.0)
Lymphocytes Relative: 40.5 % (ref 12.0–46.0)
Lymphs Abs: 2.5 10*3/uL (ref 0.7–4.0)
MCHC: 33 g/dL (ref 30.0–36.0)
MCV: 93 fl (ref 78.0–100.0)
Monocytes Absolute: 0.4 10*3/uL (ref 0.1–1.0)
Monocytes Relative: 7 % (ref 3.0–12.0)
Neutro Abs: 2.9 10*3/uL (ref 1.4–7.7)
Neutrophils Relative %: 47.5 % (ref 43.0–77.0)
Platelets: 168 10*3/uL (ref 150.0–400.0)
RBC: 4.92 Mil/uL (ref 3.87–5.11)
RDW: 13.5 % (ref 11.5–15.5)
WBC: 6.1 10*3/uL (ref 4.0–10.5)

## 2019-06-22 LAB — BASIC METABOLIC PANEL
BUN: 16 mg/dL (ref 6–23)
CO2: 28 mEq/L (ref 19–32)
Calcium: 9.7 mg/dL (ref 8.4–10.5)
Chloride: 101 mEq/L (ref 96–112)
Creatinine, Ser: 0.57 mg/dL (ref 0.40–1.20)
GFR: 106.9 mL/min (ref >60.00)
Glucose, Bld: 107 mg/dL — ABNORMAL HIGH (ref 70–99)
Potassium: 4.4 mEq/L (ref 3.5–5.1)
Sodium: 137 mEq/L (ref 135–145)

## 2019-06-22 LAB — TSH: TSH: 0.9 u[IU]/mL (ref 0.35–4.50)

## 2019-06-22 LAB — HEPATIC FUNCTION PANEL
ALT: 13 U/L (ref 0–35)
AST: 13 U/L (ref 0–37)
Albumin: 4.4 g/dL (ref 3.5–5.2)
Alkaline Phosphatase: 75 U/L (ref 39–117)
Bilirubin, Direct: 0.1 mg/dL (ref 0.0–0.3)
Total Bilirubin: 0.6 mg/dL (ref 0.2–1.2)
Total Protein: 7.1 g/dL (ref 6.0–8.3)

## 2019-06-22 LAB — VITAMIN D 25 HYDROXY (VIT D DEFICIENCY, FRACTURES): VITD: 21.27 ng/mL — ABNORMAL LOW (ref 30.00–100.00)

## 2019-06-22 NOTE — Patient Instructions (Signed)
Get colonoscopy scheduled  Consider shingles vaccine (Shingrix) and check on insurance coverage if interested.   We will call with lab results.

## 2019-06-22 NOTE — Progress Notes (Signed)
Subjective:     Patient ID: Barbara Kim, female   DOB: August 05, 1956, 63 y.o.   MRN: [id number]  HPI   Patient is seen for physical exam.  Generally very healthy.  She has hypertension treated with amlodipine.  She recently started back running and is alternating some with walking.  She does not have any other chronic medical problems.  Non-smoker.  Several health maintenance issues addressed as below.  Her weight is stable.  -She sees gynecologist yearly and has had mammograms and Pap smears through them -No history of hepatitis C vaccine -Tetanus up-to-date -No history of shingles vaccine -Has not had flu vaccine yet -No indication for pneumonia vaccine until 65 -No history of colonoscopy but she is in process of setting up  Past Medical History:  Diagnosis Date  . Chicken pox    Past Surgical History:  Procedure Laterality Date  . TONSILLECTOMY  1983    reports that she has never smoked. She has never used smokeless tobacco. She reports that she does not drink alcohol or use drugs. family history is not on file. She was adopted. No Known Allergies   Review of Systems  Constitutional: Negative for activity change, appetite change, fatigue, fever and unexpected weight change.  HENT: Negative for ear pain, hearing loss, sore throat and trouble swallowing.   Eyes: Negative for visual disturbance.  Respiratory: Negative for cough and shortness of breath.   Cardiovascular: Negative for chest pain and palpitations.  Gastrointestinal: Negative for abdominal pain, blood in stool, constipation and diarrhea.  Genitourinary: Negative for dysuria and hematuria.  Musculoskeletal: Negative for arthralgias, back pain and myalgias.  Skin: Negative for rash.  Neurological: Negative for dizziness, syncope and headaches.  Hematological: Negative for adenopathy.  Psychiatric/Behavioral: Negative for confusion and dysphoric mood.       Objective:   Physical Exam Constitutional:    Appearance: She is well-developed.  HENT:     Head: Normocephalic and atraumatic.  Eyes:     Pupils: Pupils are equal, round, and reactive to light.  Neck:     Musculoskeletal: Normal range of motion and neck supple.     Thyroid: No thyromegaly.  Cardiovascular:     Rate and Rhythm: Normal rate and regular rhythm.     Heart sounds: Normal heart sounds. No murmur.  Pulmonary:     Effort: No respiratory distress.     Breath sounds: Normal breath sounds. No wheezing or rales.  Abdominal:     General: Bowel sounds are normal. There is no distension.     Palpations: Abdomen is soft. There is no mass.     Tenderness: There is no abdominal tenderness. There is no guarding or rebound.  Musculoskeletal: Normal range of motion.  Lymphadenopathy:     Cervical: No cervical adenopathy.  Skin:    Findings: No rash.  Neurological:     Mental Status: She is alert and oriented to person, place, and time.     Cranial Nerves: No cranial nerve deficit.     Deep Tendon Reflexes: Reflexes normal.  Psychiatric:        Behavior: Behavior normal.        Thought Content: Thought content normal.        Judgment: Judgment normal.        Assessment:     Physical exam.  She has hypertension which is well controlled.  Several health maintenance issues addressed as below    Plan:     -Patient does agree to flu vaccine -  Check hepatitis C antibody -Discussed shingles vaccine and she will check on coverage -Strongly encouraged to get colonoscopy scheduled -Check lab work including vitamin D level which has been low in the past -She will continue GYN follow-up for her mammograms and Pap smears -consider DEXA at age 89.   Eulas Post MD [facility]

## 2019-06-25 ENCOUNTER — Encounter: Payer: Self-pay | Admitting: Family Medicine

## 2019-06-25 LAB — HEPATITIS C ANTIBODY
Hepatitis C Ab: NONREACTIVE
SIGNAL TO CUT-OFF: 0.01 (ref <1.00)

## 2019-07-23 ENCOUNTER — Telehealth: Payer: Self-pay | Admitting: Family Medicine

## 2019-07-23 NOTE — Telephone Encounter (Signed)
[place] (Other)  General - Other   Called to verify receipt of fax sent to office   Called to ask the nurse to call regarding a fax that was sent on 07/18/19 for patient. Please call to verify at [phone], reference ID # [id number] Fax- [phone]

## 2019-07-24 NOTE — Telephone Encounter (Signed)
Called # cause I do not see this form. They stated this was a request for Medical records. Spoke with Joycelyn Schmid in office who provided me with the number for [facility] [phone]. Informed the caller and they stated they will note that in pt's account and contact them for records

## 2019-11-23 ENCOUNTER — Ambulatory Visit: Payer: Self-pay | Attending: Family Medicine

## 2019-11-23 DIAGNOSIS — Z23 Encounter for immunization: Secondary | ICD-10-CM

## 2019-11-23 NOTE — Progress Notes (Signed)
   Covid-19 Vaccination Clinic  Name:  Barbara Kim    MRN: [id number] DOB: 1955/11/15  11/23/2019  Ms. Bench was observed post Covid-19 immunization for 15 minutes without incidence. She was provided with Vaccine Information Sheet and instruction to access the V-Safe system.   [name] was instructed to call 911 with any severe reactions post vaccine: Marland Kitchen Difficulty breathing  . Swelling of your face and throat  . A fast heartbeat  . A bad rash all over your body  . Dizziness and weakness    Immunizations Administered    Name Date Dose VIS Date Route   Moderna COVID-19 Vaccine 11/23/2019 12:10 PM 0.5 mL 08/28/2019 Intramuscular   Manufacturer: Moderna   Lot: [id number]   [place]:[id number]

## 2019-12-25 ENCOUNTER — Ambulatory Visit: Payer: Self-pay | Attending: Family

## 2019-12-25 ENCOUNTER — Ambulatory Visit: Payer: Self-pay

## 2019-12-25 DIAGNOSIS — Z23 Encounter for immunization: Secondary | ICD-10-CM

## 2019-12-25 NOTE — Progress Notes (Signed)
   Covid-19 Vaccination Clinic  Name:  Barbara Kim    MRN: [id number] DOB: 12/13/1955  12/25/2019  Barbara Kim was observed post Covid-19 immunization for 15 minutes without incident. She was provided with Vaccine Information Sheet and instruction to access the V-Safe system.   Barbara Kim was instructed to call 911 with any severe reactions post vaccine: Marland Kitchen Difficulty breathing  . Swelling of face and throat  . A fast heartbeat  . A bad rash all over body  . Dizziness and weakness   Immunizations Administered    Name Date Dose VIS Date Route   Moderna COVID-19 Vaccine 12/25/2019  2:22 PM 0.5 mL 08/28/2019 Intramuscular   Manufacturer: Moderna   Lot: [id number]   [id number]:[id number]

## 2020-01-28 ENCOUNTER — Other Ambulatory Visit: Payer: Self-pay | Admitting: Family Medicine

## 2020-07-02 ENCOUNTER — Other Ambulatory Visit: Payer: Self-pay | Admitting: Radiology

## 2020-07-02 DIAGNOSIS — N644 Mastodynia: Secondary | ICD-10-CM

## 2020-07-17 ENCOUNTER — Other Ambulatory Visit: Payer: Self-pay

## 2020-07-18 ENCOUNTER — Other Ambulatory Visit: Payer: Self-pay | Admitting: Family Medicine

## 2020-08-08 ENCOUNTER — Ambulatory Visit
Admission: RE | Admit: 2020-08-08 | Discharge: 2020-08-08 | Disposition: A | Discharge status: Home / Self Care | Payer: 59 | Source: Ambulatory Visit | Attending: Radiology | Admitting: Radiology

## 2020-08-08 ENCOUNTER — Ambulatory Visit
Admission: RE | Admit: 2020-08-08 | Discharge: 2020-08-08 | Disposition: A | Discharge status: Home / Self Care | Payer: Self-pay | Source: Ambulatory Visit | Attending: Radiology | Admitting: Radiology

## 2020-08-08 ENCOUNTER — Other Ambulatory Visit: Payer: Self-pay

## 2020-08-08 DIAGNOSIS — N644 Mastodynia: Secondary | ICD-10-CM

## 2020-08-08 IMAGING — MG DIGITAL DIAGNOSTIC BILAT W/ TOMO W/ CAD
6 of 10 series · 6 of 30 positions shown · non-contrast
Comparison: Previous exam(s).

CLINICAL DATA: Patient has intermittent pain in the LEFT breast.
Sometimes is involves the LATERAL portion of the breast and
sometimes the MEDIAL portion. Today, the patient is very tender in
the MEDIAL aspect of the LEFT breast.

EXAM:
DIGITAL DIAGNOSTIC BILATERAL MAMMOGRAM WITH CAD AND TOMO
ULTRASOUND LEFT BREAST

[L MLO synth-2D]
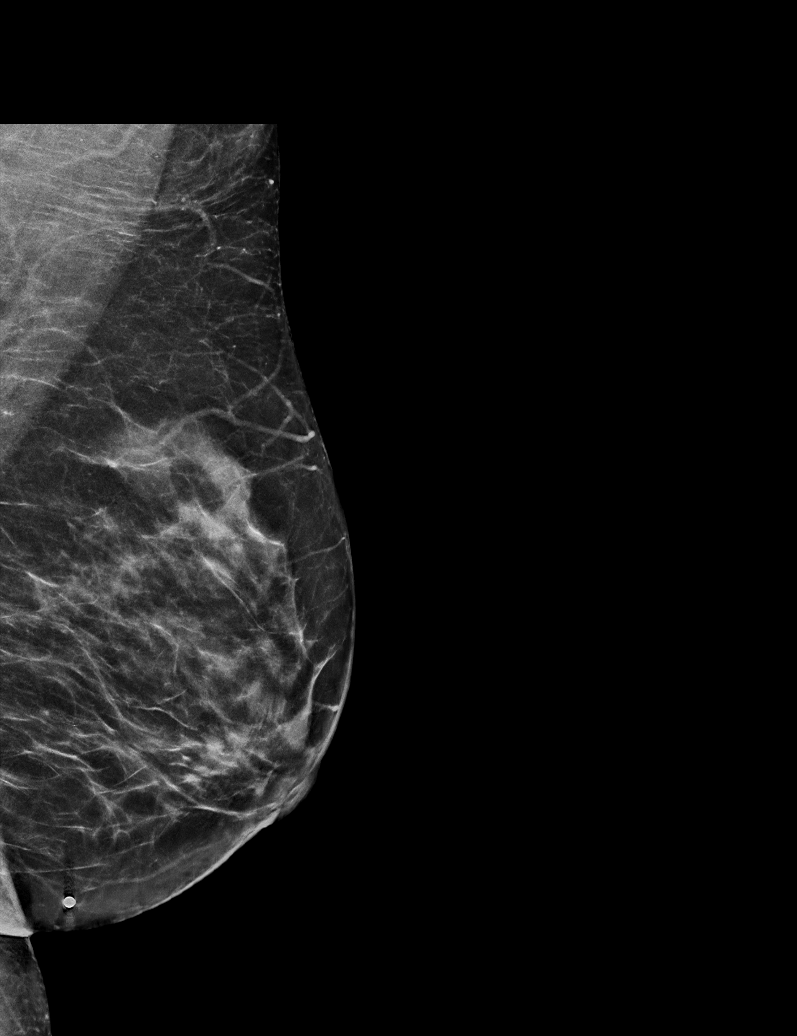

[L CC synth-2D]
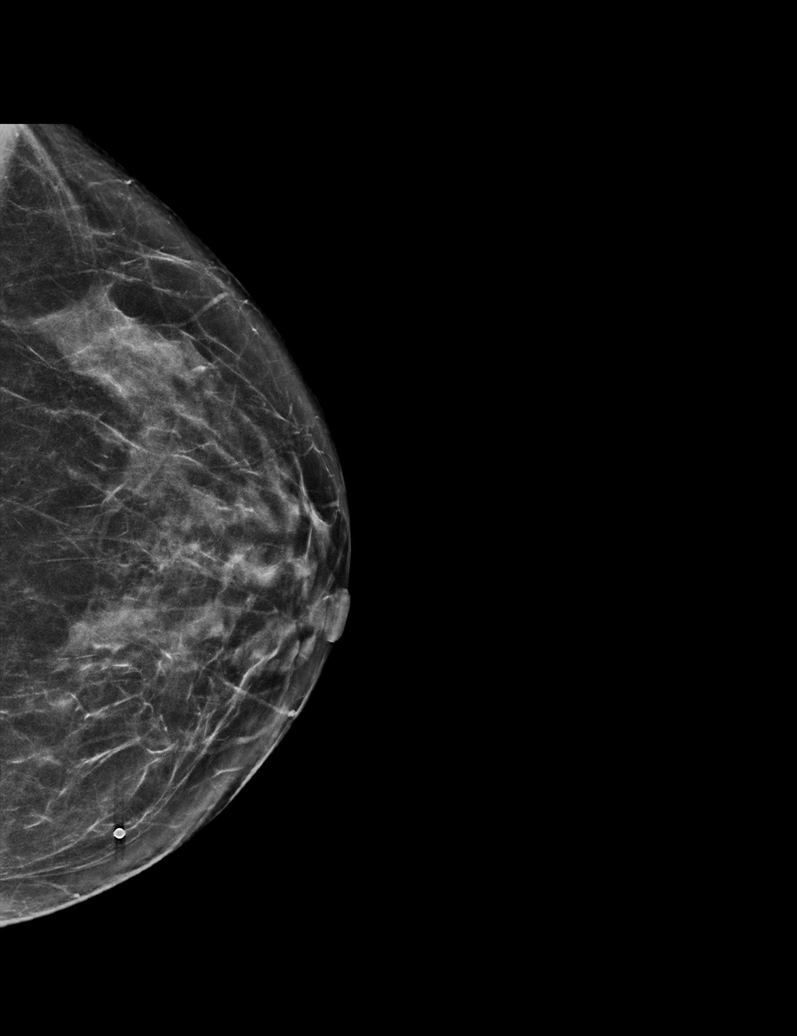

[L TAN synth-2D]
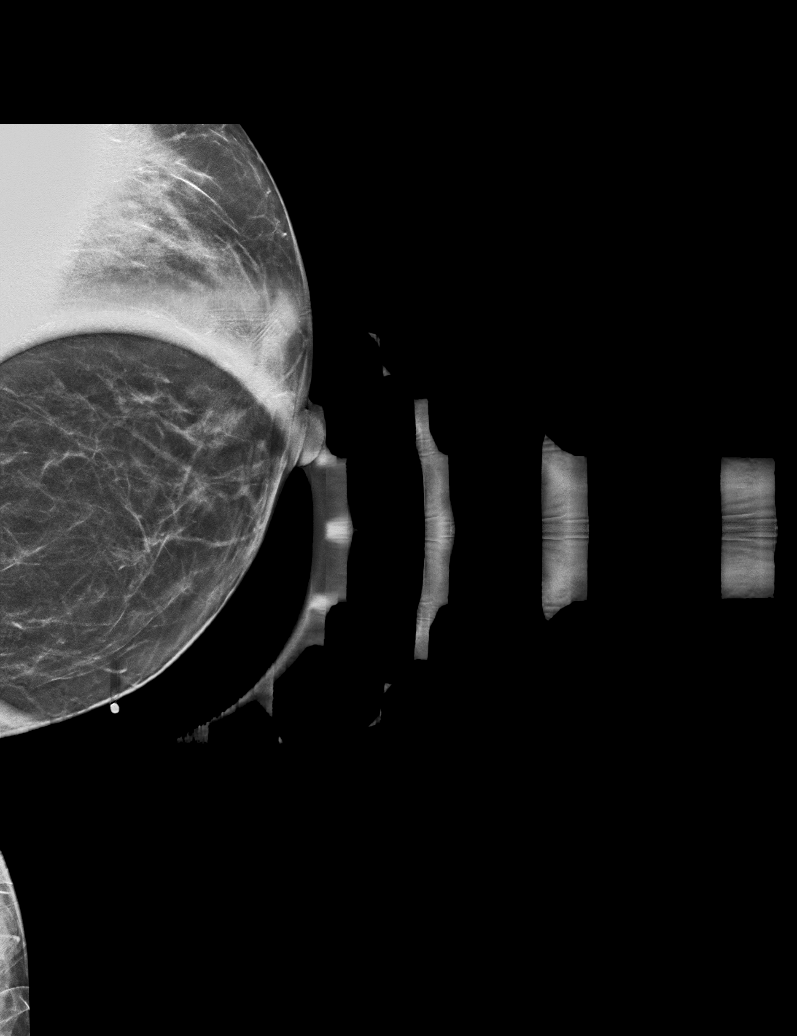

[R MLO synth-2D]
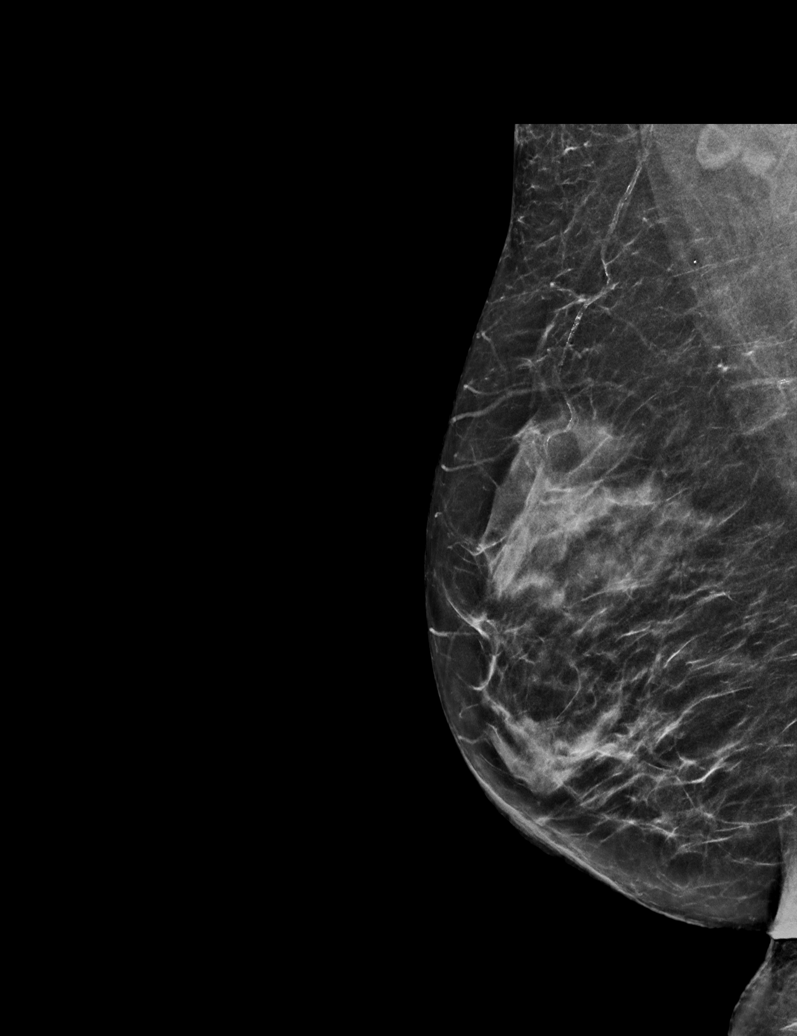

[R CC synth-2D]
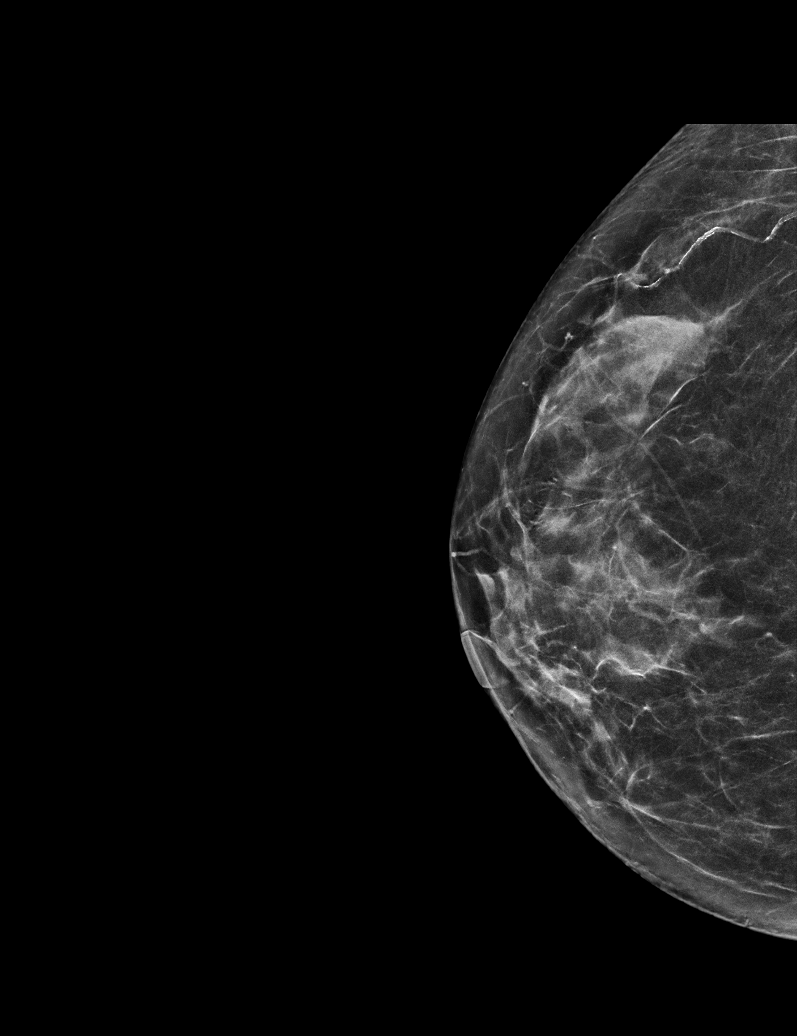

[L CC tomo · tomo slice 29/57.0]
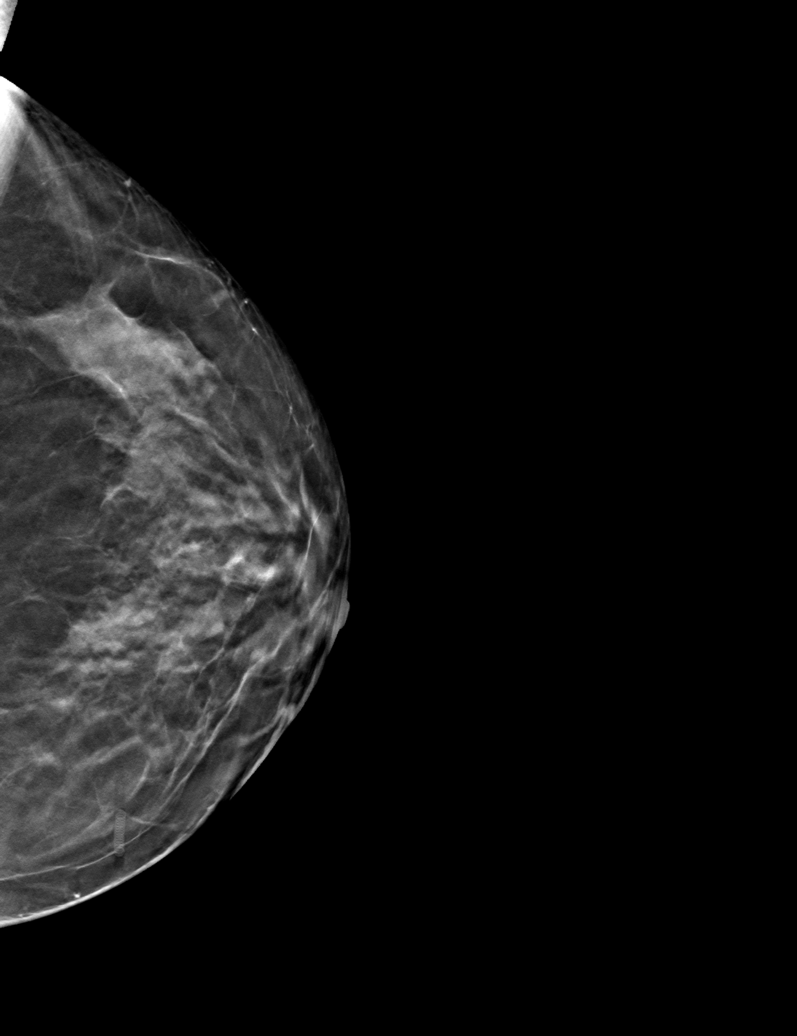

[6 of 30 positions shown; findings below may reference images not displayed]

ACR Breast Density Category c: The breast tissue is heterogeneously
dense, which may obscure small masses.
FINDINGS: No suspicious mass, distortion, or microcalcifications are
identified to suggest presence of malignancy. Spot tangential view
in the area of patient's pain is unremarkable.

Mammographic images were processed with CAD.

On physical exam, I palpate no abnormality in the MEDIAL aspect of
the LEFT breast.

Targeted ultrasound is performed, showing normal appearing
fibroglandular tissue in the MEDIAL quadrants of the LEFT breast. No
suspicious mass, distortion, or acoustic shadowing is demonstrated
with ultrasound.
IMPRESSION: No mammographic or ultrasound evidence for malignancy.

Breast pain is a common condition. It can be exacerbated by
caffeine, hormonal changes, and weight gain. It can be improved by
wearing adequate support, over-the-counter pain medication, low-fat
diet, exercise, and ice as needed. Often breast pain will resolve on
its own without intervention.

RECOMMENDATION:
Screening mammogram in one year.(Code:[M1])

I have discussed the findings and recommendations with the patient.
If applicable, a reminder letter will be sent to the patient
regarding the next appointment.

BI-RADS CATEGORY  1: Negative.

## 2020-08-08 IMAGING — US US BREAST*L* LIMITED INC AXILLA
1 series · 3 of 3 positions shown · non-contrast
Comparison: Previous exam(s).

CLINICAL DATA: Patient has intermittent pain in the LEFT breast.
Sometimes is involves the LATERAL portion of the breast and
sometimes the MEDIAL portion. Today, the patient is very tender in
the MEDIAL aspect of the LEFT breast.

EXAM:
DIGITAL DIAGNOSTIC BILATERAL MAMMOGRAM WITH CAD AND TOMO
ULTRASOUND LEFT BREAST

[Series 1: us breast*left* limited inc axilla · 0.07mm/px · 3 of 3 slices shown]
[im 1/3]
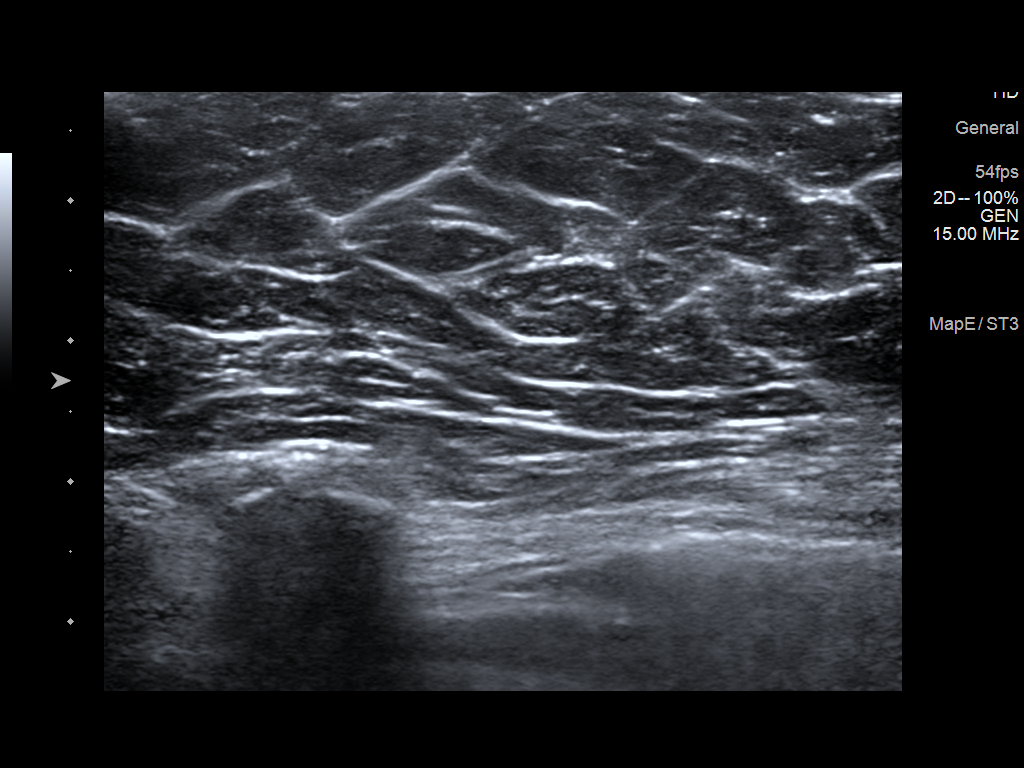
[im 2/3]
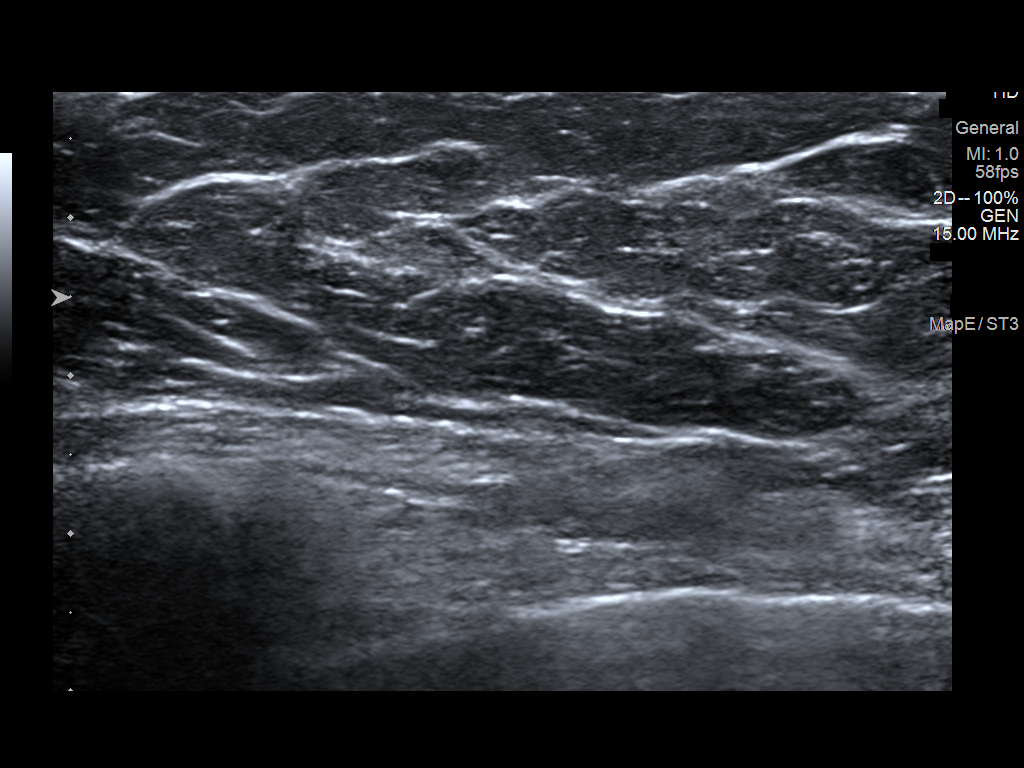
[im 3/3]
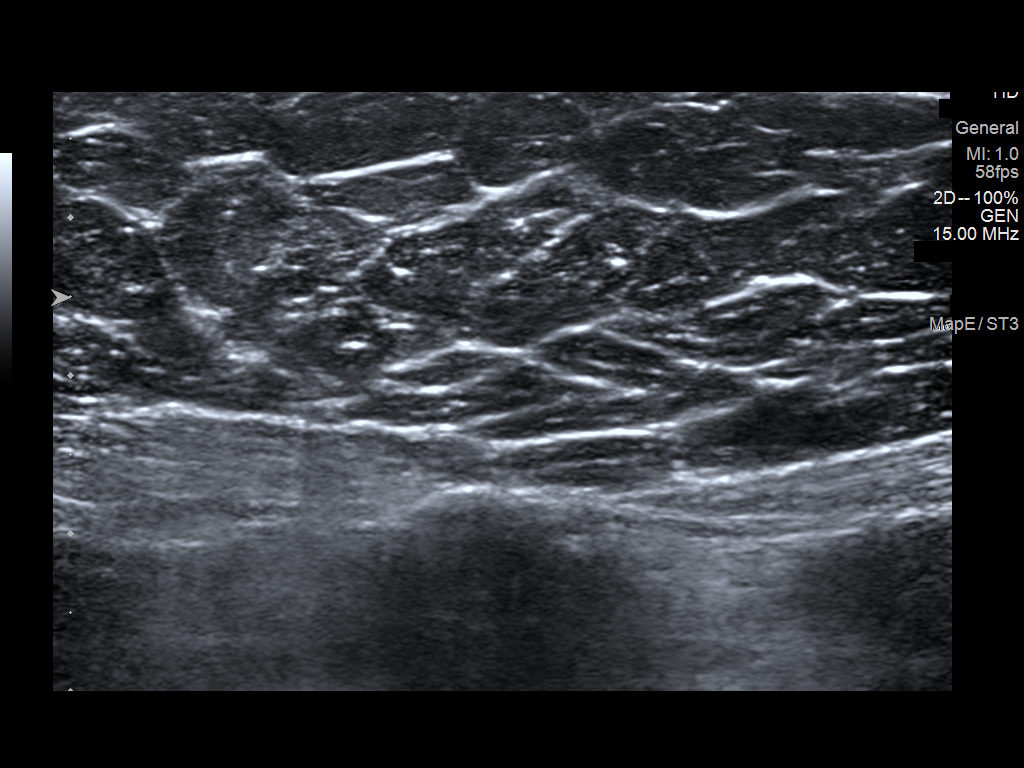

[3 of 3 positions shown; findings below may reference images not displayed]

ACR Breast Density Category c: The breast tissue is heterogeneously
dense, which may obscure small masses.
FINDINGS: No suspicious mass, distortion, or microcalcifications are
identified to suggest presence of malignancy. Spot tangential view
in the area of patient's pain is unremarkable.

Mammographic images were processed with CAD.

On physical exam, I palpate no abnormality in the MEDIAL aspect of
the LEFT breast.

Targeted ultrasound is performed, showing normal appearing
fibroglandular tissue in the MEDIAL quadrants of the LEFT breast. No
suspicious mass, distortion, or acoustic shadowing is demonstrated
with ultrasound.
IMPRESSION: No mammographic or ultrasound evidence for malignancy.

Breast pain is a common condition. It can be exacerbated by
caffeine, hormonal changes, and weight gain. It can be improved by
wearing adequate support, over-the-counter pain medication, low-fat
diet, exercise, and ice as needed. Often breast pain will resolve on
its own without intervention.

RECOMMENDATION:
Screening mammogram in one year.(Code:[M1])

I have discussed the findings and recommendations with the patient.
If applicable, a reminder letter will be sent to the patient
regarding the next appointment.

BI-RADS CATEGORY  1: Negative.

## 2020-11-26 ENCOUNTER — Ambulatory Visit (INDEPENDENT_AMBULATORY_CARE_PROVIDER_SITE_OTHER): Payer: Medicare HMO | Admitting: Family Medicine

## 2020-11-26 ENCOUNTER — Other Ambulatory Visit: Payer: Self-pay

## 2020-11-26 ENCOUNTER — Encounter: Payer: Self-pay | Admitting: Family Medicine

## 2020-11-26 VITALS — BP 140/62 | HR 56 | Ht <= 58 in | Wt 109.0 lb

## 2020-11-26 DIAGNOSIS — R202 Paresthesia of skin: Secondary | ICD-10-CM

## 2020-11-26 NOTE — Patient Instructions (Signed)
Carpal Tunnel Syndrome  Carpal tunnel syndrome is a condition that causes pain, numbness, and weakness in your hand and fingers. The carpal tunnel is a narrow area located on the palm side of your wrist. Repeated wrist motion or certain diseases may cause swelling within the tunnel. This swelling pinches the main nerve in the wrist. The main nerve in the wrist is called the median nerve. What are the causes? This condition may be caused by:  Repeated and forceful wrist and hand motions.  Wrist injuries.  Arthritis.  A cyst or tumor in the carpal tunnel.  Fluid buildup during pregnancy.  Use of tools that vibrate. Sometimes the cause of this condition is not known. What increases the risk? The following factors may make you more likely to develop this condition:  Having a job that requires you to repeatedly or forcefully move your wrist or hand or requires you to use tools that vibrate. This may include jobs that involve using computers, working on an [employer], or working with [place] such as Pension scheme manager.  Being a woman.  Having certain conditions, such as: ? Diabetes. ? Obesity. ? An underactive thyroid (hypothyroidism). ? Kidney failure. ? Rheumatoid arthritis. What are the signs or symptoms? Symptoms of this condition include:  A tingling feeling in your fingers, especially in your thumb, index, and middle fingers.  Tingling or numbness in your hand.  An aching feeling in your entire arm, especially when your wrist and elbow are bent for a long time.  Wrist pain that goes up your arm to your shoulder.  Pain that goes down into your palm or fingers.  A weak feeling in your hands. You may have trouble grabbing and holding items. Your symptoms may feel worse during the night. How is this diagnosed? This condition is diagnosed with a medical history and physical exam. You may also have tests, including:  Electromyogram (EMG). This test measures electrical  signals sent by your nerves into the muscles.  Nerve conduction study. This test measures how well electrical signals pass through your nerves.  Imaging tests, such as X-rays, ultrasound, and MRI. These tests check for possible causes of your condition. How is this treated? This condition may be treated with:  Lifestyle changes. It is important to stop or change the activity that caused your condition.  Doing exercise and activities to strengthen and stretch your muscles and tendons (physical therapy).  Making lifestyle changes to help with your condition and learning how to do your daily activities safely (occupational therapy).  Medicines for pain and inflammation. This may include medicine that is injected into your wrist.  A wrist splint or brace.  Surgery. Follow these instructions at home: If you have a splint or brace:  Wear the splint or brace as told by your health care provider. Remove it only as told by your health care provider.  Loosen the splint or brace if your fingers tingle, become numb, or turn cold and blue.  Keep the splint or brace clean.  If the splint or brace is not waterproof: ? Do not let it get wet. ? Cover it with a watertight covering when you take a bath or shower. Managing pain, stiffness, and swelling If directed, put ice on the painful area. To do this:  If you have a removeable splint or brace, remove it as told by your health care provider.  Put ice in a plastic bag.  Place a towel between your skin and the bag  or between the splint or brace and the bag.  Leave the ice on for 20 minutes, 2-3 times a day. Do not fall asleep with the cold pack on your skin.  Remove the ice if your skin turns bright red. This is very important. If you cannot feel pain, heat, or cold, you have a greater risk of damage to the area. Move your fingers often to reduce stiffness and swelling.   General instructions  Take over-the-counter and prescription  medicines only as told by your health care provider.  Rest your wrist and hand from any activity that may be causing your pain. If your condition is work related, talk with your employer about changes that can be made, such as getting a wrist pad to use while typing.  Do any exercises as told by your health care provider, physical therapist, or occupational therapist.  Keep all follow-up visits. This is important. Contact a health care provider if:  You have new symptoms.  Your pain is not controlled with medicines.  Your symptoms get worse. Get help right away if:  You have severe numbness or tingling in your wrist or hand. Summary  Carpal tunnel syndrome is a condition that causes pain, numbness, and weakness in your hand and fingers.  It is usually caused by repeated wrist motions.  Lifestyle changes and medicines are used to treat carpal tunnel syndrome. Surgery may be recommended.  Follow your health care provider's instructions about wearing a splint, resting from activity, keeping follow-up visits, and calling for help. This information is not intended to replace advice given to you by your health care provider. Make sure you discuss any questions you have with your health care provider. Document Revised: 01/24/2020 Document Reviewed: 01/24/2020 Elsevier Patient Education  [place].  Try left wrist splint at night  If not improved in 3 weeks let me know.

## 2020-11-26 NOTE — Progress Notes (Signed)
Established Patient Office Visit  Subjective:  Patient ID: Barbara Kim, female    DOB: 01-16-1956  Age: 65 y.o. MRN: [id number]  CC:  Chief Complaint  Patient presents with  . Numbness    In hands, denies injury or fall    HPI Barbara Kim presents for several week history of some intermittent numbness involving her left hand.  This involves the first through third digits predominantly and slightly fourth.  She states this feels "thick ".  No real pain.  No weakness.  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  Symptoms tend to be worse in the morning when she first wakes up.  She denies any neck pain.  No arm symptoms.  No right upper extremity symptoms.  She is right-hand dominant.  Denies any lower extremity symptoms  Past Medical History:  Diagnosis Date  . Chicken pox     Past Surgical History:  Procedure Laterality Date  . TONSILLECTOMY  1983    Family History  Adopted: Yes    Social History   Socioeconomic History  . Marital status: Unknown    Spouse name: Not on file  . Number of children: Not on file  . Years of education: Not on file  . Highest education level: Not on file  Occupational History  . Not on file  Tobacco Use  . Smoking status: Never Smoker  . Smokeless tobacco: Never Used  Vaping Use  . Vaping Use: Never used  Substance and Sexual Activity  . Alcohol use: No  . Drug use: No  . Sexual activity: Not on file  Other Topics Concern  . Not on file  Social History Narrative  . Not on file   Social Determinants of Health   Financial Resource Strain: Not on file  Food Insecurity: Not on file  Transportation Needs: Not on file  Physical Activity: Not on file  Stress: Not on file  Social Connections: Not on file  Intimate Partner Violence: Not on file    Outpatient Medications Prior to Visit  Medication Sig Dispense Refill  . amLODipine (NORVASC) 5 MG tablet TAKE 1 TABLET BY MOUTH EVERY DAY 90 tablet 1  .  aspirin 81 MG tablet Take 81 mg by mouth daily.    . Vitamin D, Ergocalciferol, (DRISDOL) 50000 units CAPS capsule TAKE 1 CAPSULE (50,000 UNITS TOTAL) BY MOUTH EVERY 7 (SEVEN) DAYS. (Patient not taking: Reported on 06/22/2019) 4 capsule 0   No facility-administered medications prior to visit.    No Known Allergies  ROS Review of Systems  Cardiovascular: Negative for chest pain.  Musculoskeletal: Negative for neck pain.  Neurological: Positive for numbness. Negative for weakness.      Objective:    Physical Exam Vitals reviewed.  Constitutional:      Appearance: Normal appearance.  Cardiovascular:     Rate and Rhythm: Normal rate and regular rhythm.  Musculoskeletal:     Comments: No muscle atrophy upper extremities.  Neurological:     Mental Status: She is alert.     Comments: She has symmetric reflexes upper extremities.  Full strength throughout.  Normal sensory function throughout with monofilament and to touch     BP 140/62 (BP Location: Left Arm, Cuff Size: Normal)   Pulse (!) 56   Ht 4\' 6"  (1.372 m)   Wt 109 lb (49.4 kg)   SpO2 96%   BMI 26.28 kg/m  Wt Readings from Last 3 Encounters:  11/26/20 109 lb (49.4 kg)  06/22/19 120 lb 14.4 oz (54.8 kg)  06/02/18 119 lb 4.8 oz (54.1 kg)     Health Maintenance Due  Topic Date Due  . HIV Screening  Never done  . COLONOSCOPY (Pts 45-88yrs Insurance coverage will need to be confirmed)  Never done  . INFLUENZA VACCINE  04/27/2020  . COVID-19 Vaccine (3 - Booster for Moderna series) 06/26/2020  . DEXA SCAN  Never done  . PNA vac Low Risk Adult (1 of 2 - PCV13) Never done    There are no preventive care reminders to display for this patient.  Lab Results  Component Value Date   TSH 0.90 06/22/2019   Lab Results  Component Value Date   WBC 6.1 06/22/2019   HGB 15.1 (H) 06/22/2019   HCT 45.8 06/22/2019   MCV 93.0 06/22/2019   PLT 168.0 06/22/2019   Lab Results  Component Value Date   NA 137 06/22/2019   K  4.4 06/22/2019   CO2 28 06/22/2019   GLUCOSE 107 (H) 06/22/2019   BUN 16 06/22/2019   CREATININE 0.57 06/22/2019   BILITOT 0.6 06/22/2019   ALKPHOS 75 06/22/2019   AST 13 06/22/2019   ALT 13 06/22/2019   PROT 7.1 06/22/2019   ALBUMIN 4.4 06/22/2019   CALCIUM 9.7 06/22/2019   GFR 106.90 06/22/2019   Lab Results  Component Value Date   CHOL 234 (H) 06/22/2019   Lab Results  Component Value Date   HDL 59.50 06/22/2019   Lab Results  Component Value Date   LDLCALC 153 (H) 06/22/2019   Lab Results  Component Value Date   TRIG 105.0 06/22/2019   Lab Results  Component Value Date   CHOLHDL 4 06/22/2019   No results found for: HGBA1C    Assessment & Plan:   Intermittent numbness left hand predominantly first through third digits with some involvement of the fourth.  Sparing of the fifth.  Suspect carpal tunnel syndrome.  Recommend night splint for the next few weeks.  If this is not resolving in the next few weeks be in touch.  Consider neurology referral for nerve conduction velocities if not improved in 3 to 4 weeks.  No orders of the defined types were placed in this encounter.   Follow-up: No follow-ups on file.    Carolann Littler, MD

## 2021-01-13 ENCOUNTER — Other Ambulatory Visit: Payer: Self-pay | Admitting: Family Medicine

## 2021-01-17 ENCOUNTER — Other Ambulatory Visit: Payer: Self-pay | Admitting: Family Medicine

## 2021-01-19 ENCOUNTER — Telehealth: Payer: Self-pay | Admitting: Family Medicine

## 2021-01-19 MED ORDER — AMLODIPINE BESYLATE 5 MG PO TABS
1.0000 | ORAL_TABLET | Freq: Every day | ORAL | 0 refills | Status: DC
Start: 2021-01-19 — End: 2021-04-21

## 2021-01-19 NOTE — Telephone Encounter (Signed)
Rx sent in. Nothing further needed.

## 2021-01-19 NOTE — Telephone Encounter (Signed)
Pt called again about her medication refill states she is out

## 2021-01-20 ENCOUNTER — Encounter: Payer: Self-pay | Admitting: Family Medicine

## 2021-01-20 ENCOUNTER — Other Ambulatory Visit: Payer: Self-pay

## 2021-01-20 ENCOUNTER — Ambulatory Visit (INDEPENDENT_AMBULATORY_CARE_PROVIDER_SITE_OTHER): Payer: Medicare HMO | Admitting: Family Medicine

## 2021-01-20 VITALS — BP 140/88 | HR 80 | Temp 99.0°F | Wt 103.2 lb

## 2021-01-20 DIAGNOSIS — R4182 Altered mental status, unspecified: Secondary | ICD-10-CM | POA: Diagnosis not present

## 2021-01-20 DIAGNOSIS — R251 Tremor, unspecified: Secondary | ICD-10-CM

## 2021-01-20 DIAGNOSIS — R634 Abnormal weight loss: Secondary | ICD-10-CM | POA: Diagnosis not present

## 2021-01-20 NOTE — Patient Instructions (Signed)

## 2021-01-20 NOTE — Progress Notes (Signed)
Established Patient Office Visit  Subjective:  Patient ID: Barbara Kim, female    DOB: September 29, 1955  Age: 65 y.o. MRN: [id number]  CC:  Chief Complaint  Patient presents with  . Altered Mental Status    Pt's husband stated he and their children have noticed that she becomes confused easily, is not engaging in conversation and seems to been in a daze frequently    HPI Barbara Kim presents for recent issues including 2 to 3-week history of tremor predominantly right upper extremity, increased anxiety symptoms, and possible mental status changes.  They also relate that she has had some loss of weight of approximately 10 pounds over the past couple months.  Denies any recent adenopathy, night sweats, abdominal pain, change in stool habits, cough, dyspnea, chest pain.  She is accompanied today by her husband.  They state that she has had tremor in her right upper extremity about 2 weeks with no prior history of tremor.  Husband has noted when they are in conversation that she seems confused at times and sometimes he has to repeat things.  This is a very definite change in mental status.  She seems to have difficulty following conversation at times and not only husband but also other children have noticed this as well.  Iline denies any headaches, speech changes, focal weakness, recent head injury or falls, seizure, fever, chills.  No recent visual changes.  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  No alcohol use.  Non-smoker.  She is adopted.  She states her appetite is fair but she has had documented weight loss.  She does have history of hypertension and is on amlodipine for that.  They have not been monitoring her blood pressures.  Past Medical History:  Diagnosis Date  . Chicken pox     Past Surgical History:  Procedure Laterality Date  . TONSILLECTOMY  1983    Family History  Adopted: Yes    Social History   Socioeconomic History  . Marital  status: Unknown    Spouse name: Not on file  . Number of children: Not on file  . Years of education: Not on file  . Highest education level: Not on file  Occupational History  . Not on file  Tobacco Use  . Smoking status: Never Smoker  . Smokeless tobacco: Never Used  Vaping Use  . Vaping Use: Never used  Substance and Sexual Activity  . Alcohol use: No  . Drug use: No  . Sexual activity: Not on file  Other Topics Concern  . Not on file  Social History Narrative  . Not on file   Social Determinants of Health   Financial Resource Strain: Not on file  Food Insecurity: Not on file  Transportation Needs: Not on file  Physical Activity: Not on file  Stress: Not on file  Social Connections: Not on file  Intimate Partner Violence: Not on file    Outpatient Medications Prior to Visit  Medication Sig Dispense Refill  . amLODipine (NORVASC) 5 MG tablet Take 1 tablet (5 mg total) by mouth daily. 90 tablet 0  . aspirin 81 MG tablet Take 81 mg by mouth daily.    . Vitamin D, Ergocalciferol, (DRISDOL) 50000 units CAPS capsule TAKE 1 CAPSULE (50,000 UNITS TOTAL) BY MOUTH EVERY 7 (SEVEN) DAYS. 4 capsule 0   No facility-administered medications prior to visit.    No Known Allergies  ROS Review of Systems  Constitutional: Positive for unexpected weight  change. Negative for chills and fever.  Eyes: Negative for visual disturbance.  Respiratory: Negative for cough and shortness of breath.   Cardiovascular: Negative for chest pain.  Gastrointestinal: Negative for abdominal pain, nausea and vomiting.  Genitourinary: Negative for dysuria.  Neurological: Positive for tremors. Negative for dizziness, seizures, syncope, facial asymmetry, speech difficulty, weakness and headaches.  Hematological: Negative for adenopathy.  Psychiatric/Behavioral: Positive for confusion. Negative for hallucinations and sleep disturbance. The patient is nervous/anxious.       Objective:    Physical  Exam Vitals reviewed.  Constitutional:      Appearance: Normal appearance.  Cardiovascular:     Rate and Rhythm: Regular rhythm.  Pulmonary:     Effort: Pulmonary effort is normal.     Breath sounds: Normal breath sounds.  Abdominal:     Palpations: Abdomen is soft. There is no mass.     Tenderness: There is no abdominal tenderness.  Musculoskeletal:     Cervical back: Neck supple.     Right lower leg: No edema.     Left lower leg: No edema.  Lymphadenopathy:     Cervical: No cervical adenopathy.  Neurological:     General: No focal deficit present.     Mental Status: She is alert and oriented to person, place, and time.     Cranial Nerves: No cranial nerve deficit.     Motor: No weakness.     Coordination: Coordination normal.     Gait: Gait normal.     Deep Tendon Reflexes: Reflexes normal.     Comments: She has definite tremor right upper extremity predominantly.  Very subtle left upper extremity.  No obvious head or neck tremor.  No ataxia.  No focal weakness.  Psychiatric:     Comments: Seems slightly anxious.  Occasionally had to repeat questions     BP 140/88 (BP Location: Left Arm, Patient Position: Sitting, Cuff Size: Normal)   Pulse 80   Temp 99 F (37.2 C) (Oral)   Wt 103 lb 3.2 oz (46.8 kg)   SpO2 96%   BMI 24.88 kg/m  Wt Readings from Last 3 Encounters:  01/20/21 103 lb 3.2 oz (46.8 kg)  11/26/20 109 lb (49.4 kg)  06/22/19 120 lb 14.4 oz (54.8 kg)     Health Maintenance Due  Topic Date Due  . HIV Screening  Never done  . COLONOSCOPY (Pts 45-10yrs Insurance coverage will need to be confirmed)  Never done  . COVID-19 Vaccine (3 - Booster for Moderna series) 06/26/2020  . DEXA SCAN  Never done  . PNA vac Low Risk Adult (1 of 2 - PCV13) Never done    There are no preventive care reminders to display for this patient.  Lab Results  Component Value Date   TSH 0.90 06/22/2019   Lab Results  Component Value Date   WBC 6.1 06/22/2019   HGB 15.1  (H) 06/22/2019   HCT 45.8 06/22/2019   MCV 93.0 06/22/2019   PLT 168.0 06/22/2019   Lab Results  Component Value Date   NA 137 06/22/2019   K 4.4 06/22/2019   CO2 28 06/22/2019   GLUCOSE 107 (H) 06/22/2019   BUN 16 06/22/2019   CREATININE 0.57 06/22/2019   BILITOT 0.6 06/22/2019   ALKPHOS 75 06/22/2019   AST 13 06/22/2019   ALT 13 06/22/2019   PROT 7.1 06/22/2019   ALBUMIN 4.4 06/22/2019   CALCIUM 9.7 06/22/2019   GFR 106.90 06/22/2019   Lab Results  Component Value  Date   CHOL 234 (H) 06/22/2019   Lab Results  Component Value Date   HDL 59.50 06/22/2019   Lab Results  Component Value Date   LDLCALC 153 (H) 06/22/2019   Lab Results  Component Value Date   TRIG 105.0 06/22/2019   Lab Results  Component Value Date   CHOLHDL 4 06/22/2019   No results found for: HGBA1C    Assessment & Plan:   Problem List Items Addressed This Visit   None   Visit Diagnoses    Unintended weight loss    -  Primary   Relevant Orders   CBC with Differential/Platelet   TSH   CMP   Tremor       Relevant Orders   MR Brain Wo Contrast   Altered mental status, unspecified altered mental status type       Relevant Orders   MR Brain Wo Contrast    Patient presents with recent noted tremor right upper extremity along with unintentional weight loss and likely mental status changes noted by several family members.  She does have obvious tremor right upper extremity on exam which is new.  Needs further evaluation  -Check labs with CBC, comprehensive metabolic panel, TSH -Check MRI brain -If above unrevealing set up neurology referral for further evaluation  No orders of the defined types were placed in this encounter.   Follow-up: No follow-ups on file.    Carolann Littler, MD

## 2021-01-21 LAB — CBC WITH DIFFERENTIAL/PLATELET
Basophils Absolute: 0.1 10*3/uL (ref 0.0–0.1)
Basophils Relative: 0.5 % (ref 0.0–3.0)
Eosinophils Absolute: 0.1 10*3/uL (ref 0.0–0.7)
Eosinophils Relative: 0.7 % (ref 0.0–5.0)
HCT: 46.8 % — ABNORMAL HIGH (ref 36.0–46.0)
Hemoglobin: 15.6 g/dL — ABNORMAL HIGH (ref 12.0–15.0)
Lymphocytes Relative: 26.8 % (ref 12.0–46.0)
Lymphs Abs: 2.8 10*3/uL (ref 0.7–4.0)
MCHC: 33.4 g/dL (ref 30.0–36.0)
MCV: 95 fl (ref 78.0–100.0)
Monocytes Absolute: 0.7 10*3/uL (ref 0.1–1.0)
Monocytes Relative: 6.7 % (ref 3.0–12.0)
Neutro Abs: 6.7 10*3/uL (ref 1.4–7.7)
Neutrophils Relative %: 65.3 % (ref 43.0–77.0)
Platelets: 186 10*3/uL (ref 150.0–400.0)
RBC: 4.93 Mil/uL (ref 3.87–5.11)
RDW: 13.7 % (ref 11.5–15.5)
WBC: 10.3 10*3/uL (ref 4.0–10.5)

## 2021-01-21 LAB — COMPREHENSIVE METABOLIC PANEL
ALT: 12 U/L (ref 0–35)
AST: 13 U/L (ref 0–37)
Albumin: 4.5 g/dL (ref 3.5–5.2)
Alkaline Phosphatase: 58 U/L (ref 39–117)
BUN: 16 mg/dL (ref 6–23)
CO2: 25 mEq/L (ref 19–32)
Calcium: 9.7 mg/dL (ref 8.4–10.5)
Chloride: 101 mEq/L (ref 96–112)
Creatinine, Ser: 0.65 mg/dL (ref 0.40–1.20)
GFR: 92.52 mL/min (ref >60.00)
Glucose, Bld: 105 mg/dL — ABNORMAL HIGH (ref 70–99)
Potassium: 3.8 mEq/L (ref 3.5–5.1)
Sodium: 139 mEq/L (ref 135–145)
Total Bilirubin: 0.5 mg/dL (ref 0.2–1.2)
Total Protein: 7.4 g/dL (ref 6.0–8.3)

## 2021-01-21 LAB — TSH: TSH: 1.49 u[IU]/mL (ref 0.35–4.50)

## 2021-01-28 ENCOUNTER — Other Ambulatory Visit: Payer: Self-pay

## 2021-01-28 ENCOUNTER — Ambulatory Visit
Admission: RE | Admit: 2021-01-28 | Discharge: 2021-01-28 | Disposition: A | Discharge status: Home / Self Care | Payer: Medicare HMO | Source: Ambulatory Visit | Attending: Family Medicine | Admitting: Family Medicine

## 2021-01-28 ENCOUNTER — Other Ambulatory Visit: Payer: Self-pay | Admitting: Family Medicine

## 2021-01-28 DIAGNOSIS — R251 Tremor, unspecified: Secondary | ICD-10-CM

## 2021-01-28 DIAGNOSIS — R4182 Altered mental status, unspecified: Secondary | ICD-10-CM

## 2021-01-28 IMAGING — MR MR HEAD WO/W CM
13 series · 48 of 48 positions shown · IV contrast (10ml Multihance)
Comparison: None.

CLINICAL DATA: Intermittent hand shaking for 1 month.

EXAM:
MRI HEAD WITHOUT AND WITH CONTRAST
TECHNIQUE: Multiplanar, multiecho pulse sequences of the brain and surrounding
structures were obtained without and with intravenous contrast.
CONTRAST:  Reference EMR

[Series 2: T1 · sagittal · 5.0mm · 0.45mm/px · 1 of 21 slices shown]
[im 1/21]
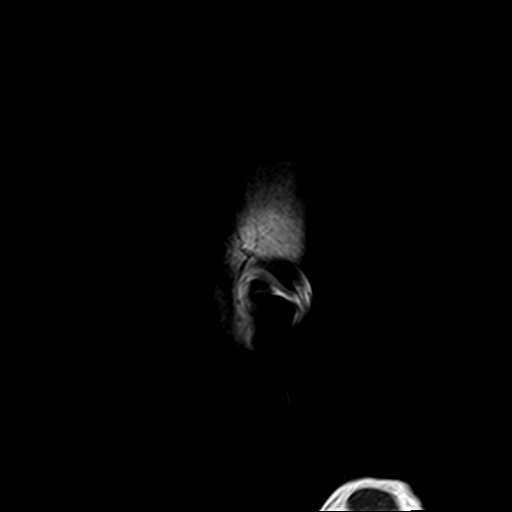

[Series 3: DWI · axial · 3.0mm · 1.80mm/px · z∈[-64,+83]mm · 6 of 99 slices shown (1 of 4)]
[im 1/99]
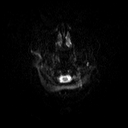
[im 20/99]
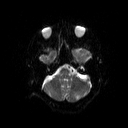
[im 40/99]
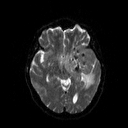
[im 59/99]
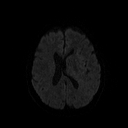
[im 79/99]
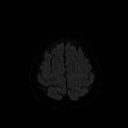
[im 99/99]
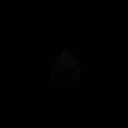

[Series 4: DWI · axial · 3.0mm · 1.80mm/px · z∈[-64,+83]mm · 3 of 47 slices shown (2 of 4)]
[im 1/47]
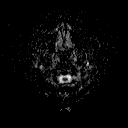
[im 24/47]
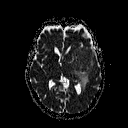
[im 47/47]
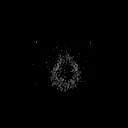

[Series 5: DWI · coronal · 5.0mm · 1.80mm/px · 5 of 68 slices shown (3 of 4)]
[im 1/68]
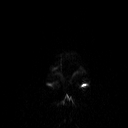
[im 17/68]
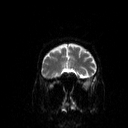
[im 34/68]
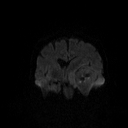
[im 51/68]
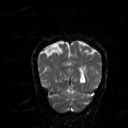
[im 68/68]
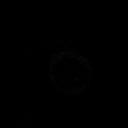

[Series 6: DWI · coronal · 5.0mm · 1.80mm/px · 2 of 34 slices shown (4 of 4)]
[im 1/34]
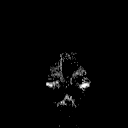
[im 34/34]
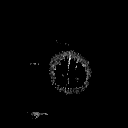

[Series 7: T2 · axial · 5.0mm · 0.60mm/px · 1 of 22 slices shown (1 of 2)]
[im 1/22]
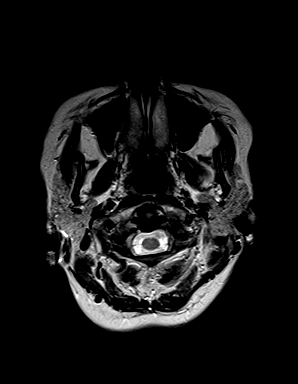

[Series 8: t1_mpr_tra · axial · 1.0mm · 0.71mm/px · z∈[-61,+82]mm · 10 of 144 slices shown]
[im 1/144]
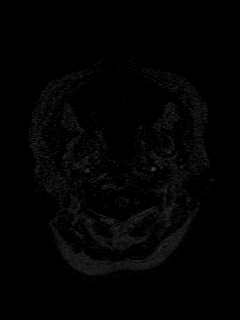
[im 16/144]
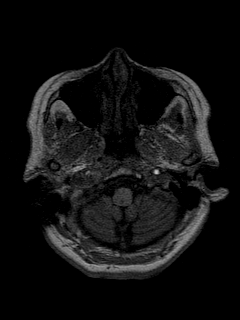
[im 32/144]
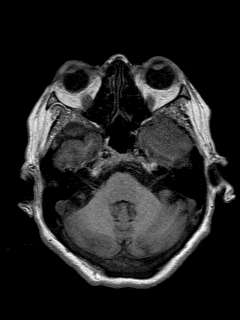
[im 48/144]
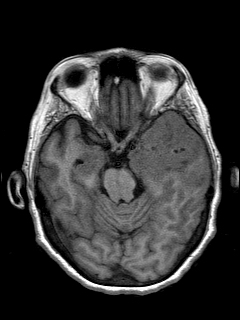
[im 64/144]
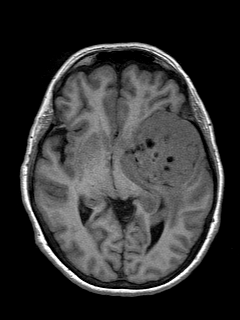
[im 80/144]
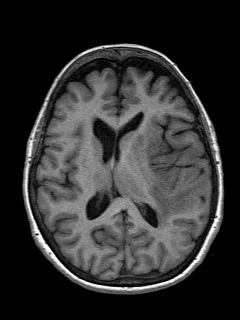
[im 96/144]
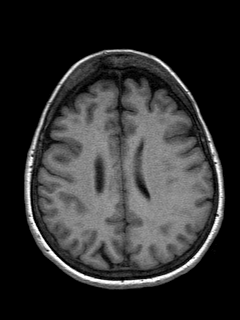
[im 112/144]
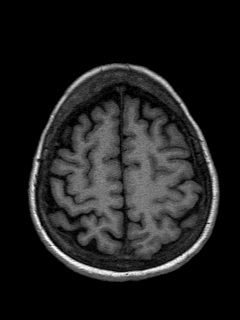
[im 128/144]
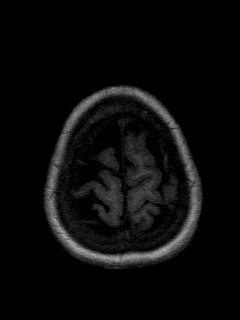
[im 144/144]
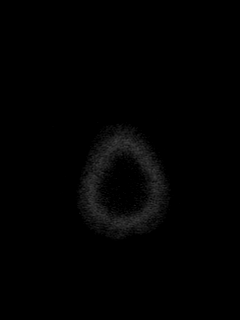

[Series 9: FLAIR · axial · 3.0mm · 0.45mm/px · z∈[-58,+77]mm · 2 of 30 slices shown]
[im 1/30]
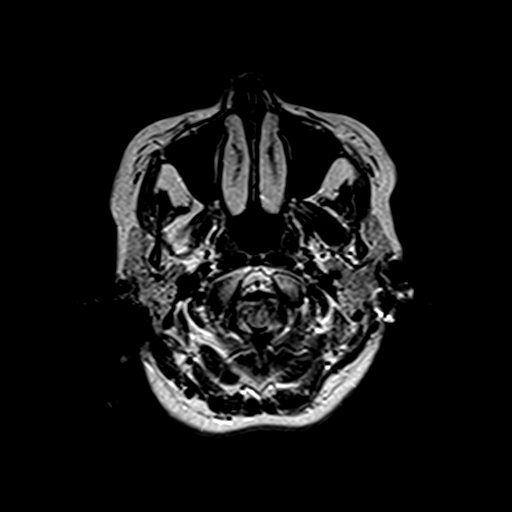
[im 30/30]
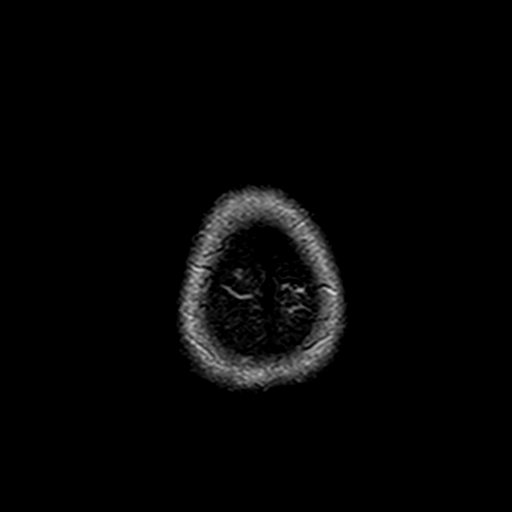

[Series 11: swi_images · axial · 4.0mm · 0.90mm/px · z∈[-60,+79]mm · 2 of 36 slices shown]
[im 1/36]
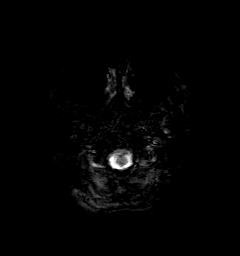
[im 36/36]
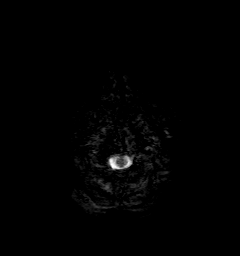

[Series 12: T2 · coronal · 5.0mm · 0.45mm/px · 2 of 25 slices shown (2 of 2)]
[im 1/25]
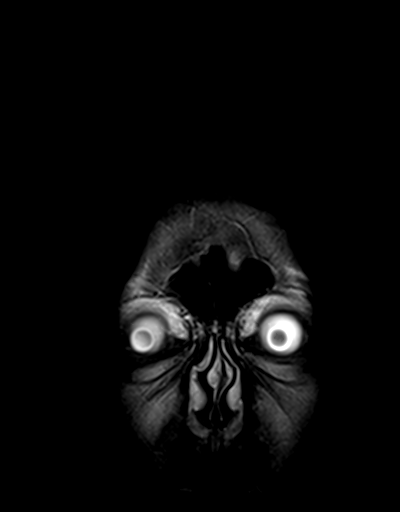
[im 25/25]
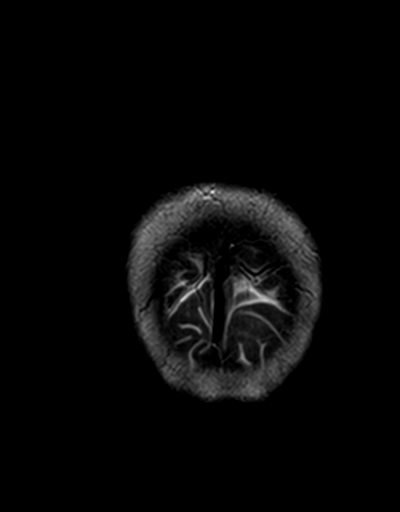

[Series 13: t1_mpr_tra post · axial · 1.0mm · 0.94mm/px · z∈[-61,+82]mm · 10 of 144 slices shown]
[im 1/144]
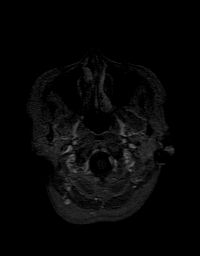
[im 16/144]
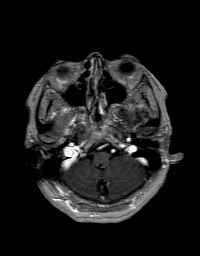
[im 32/144]
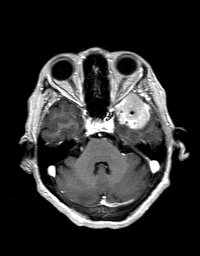
[im 48/144]
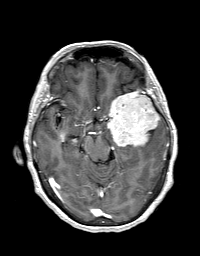
[im 64/144]
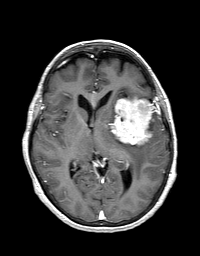
[im 80/144]
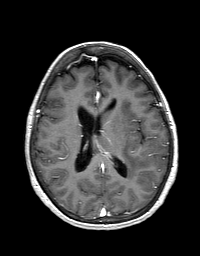
[im 96/144]
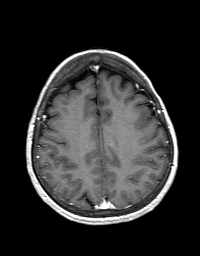
[im 112/144]
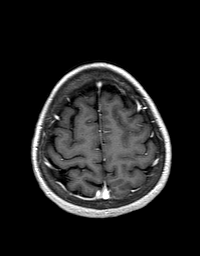
[im 128/144]
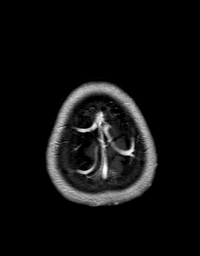
[im 144/144]
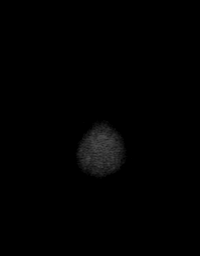

[Series 14: post cor · coronal · 5.0mm · 0.45mm/px · 2 of 25 slices shown]
[im 1/25]
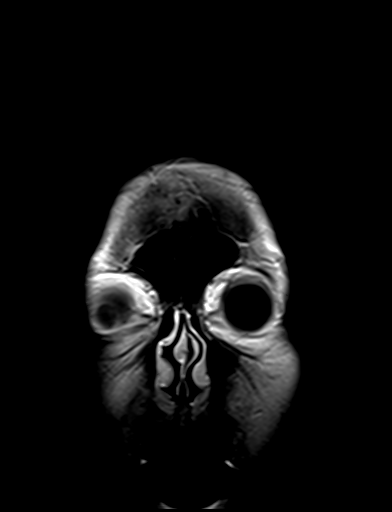
[im 25/25]
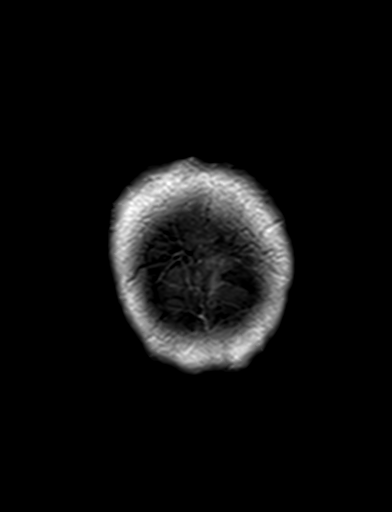

[Series 15: post sag (optional · sagittal · 5.0mm · 0.45mm/px · 2 of 23 slices shown]
[im 1/23]
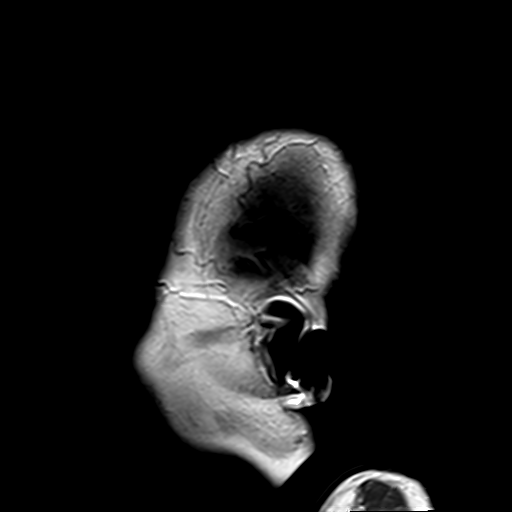
[im 23/23]
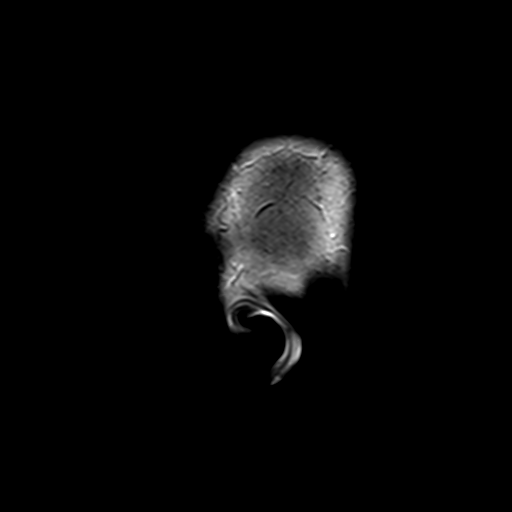

[48 of 48 positions shown; findings below may reference images not displayed]

FINDINGS: Brain: Avidly enhancing mass at the left middle cranial fossa and
lower sylvian fissure which measures 5.8 x 4.8 x 4.2 cm. There is
broad contact with the dura at the level of the sphenoid wing with
suggested dural tail on parasagittal postcontrast images. Rounded
hypointense and blooming areas suggesting internal calcification.
There is buckling of the adjacent cortex with brain edema. The left
subcortical brain is also edematous and displaced. Midline shift
measures up to 7 mm. No hydrocephalus or entrapment. No infarct.

Vascular: Preserved flow voids and vascular enhancements.

Skull and upper cervical spine: Normal marrow signal.

Sinuses/Orbits: Negative

These results were called by telephone at the time of interpretation
on [DATE] at [DATE] to provider NENG. The patient's
husband was called to assist with transportation given potential for
partial seizure. Findings were discussed with the patient.
IMPRESSION: 5.8 x 4.8 x 4.2 cm mass at the left middle cranial fossa and lower
sylvian fissure, favored extra-axial and from meningioma. Prominent
local mass effect with vasogenic edema and midline shift measuring
up to 7 mm.

## 2021-01-28 MED ORDER — GADOBENATE DIMEGLUMINE 529 MG/ML IV SOLN
10.0000 mL | Freq: Once | INTRAVENOUS | Status: AC | PRN
  Administered 2021-01-28: 10 mL via INTRAVENOUS

## 2021-01-29 DIAGNOSIS — D329 Benign neoplasm of meninges, unspecified: Secondary | ICD-10-CM | POA: Diagnosis not present

## 2021-01-29 DIAGNOSIS — I10 Essential (primary) hypertension: Secondary | ICD-10-CM | POA: Diagnosis not present

## 2021-01-30 ENCOUNTER — Other Ambulatory Visit: Payer: Self-pay | Admitting: Neurosurgery

## 2021-01-30 DIAGNOSIS — D329 Benign neoplasm of meninges, unspecified: Secondary | ICD-10-CM

## 2021-02-02 ENCOUNTER — Other Ambulatory Visit: Payer: Self-pay | Admitting: Neurosurgery

## 2021-02-03 ENCOUNTER — Ambulatory Visit
Admission: RE | Admit: 2021-02-03 | Discharge: 2021-02-03 | Disposition: A | Discharge status: Home / Self Care | Payer: Medicare HMO | Source: Ambulatory Visit | Attending: Neurosurgery | Admitting: Neurosurgery

## 2021-02-03 DIAGNOSIS — G936 Cerebral edema: Secondary | ICD-10-CM | POA: Diagnosis not present

## 2021-02-03 DIAGNOSIS — Z01818 Encounter for other preprocedural examination: Secondary | ICD-10-CM | POA: Diagnosis not present

## 2021-02-03 DIAGNOSIS — D329 Benign neoplasm of meninges, unspecified: Secondary | ICD-10-CM

## 2021-02-03 DIAGNOSIS — J811 Chronic pulmonary edema: Secondary | ICD-10-CM | POA: Diagnosis not present

## 2021-02-03 IMAGING — CT CT ANGIO HEAD
3 of 10 series · 16 of 47 positions shown · IV contrast (iopamidol)
Comparison: MRI brain [DATE]

CLINICAL DATA: Meningioma, preoperative

EXAM:
CT ANGIOGRAPHY HEAD
TECHNIQUE: Multidetector CT imaging of the head was performed using the
standard protocol during bolus administration of intravenous
contrast. Multiplanar CT image reconstructions and MIPs were
obtained to evaluate the vascular anatomy.
CONTRAST:  75mL [AV] IOPAMIDOL ([AV]) INJECTION 76%

[Series 16: cta brain 1.00 hv48 ax thin mips · axial · 0.40mm/px · z∈[-656,-518]mm · 10 of 170 slices shown]
[im 16/170  brain]
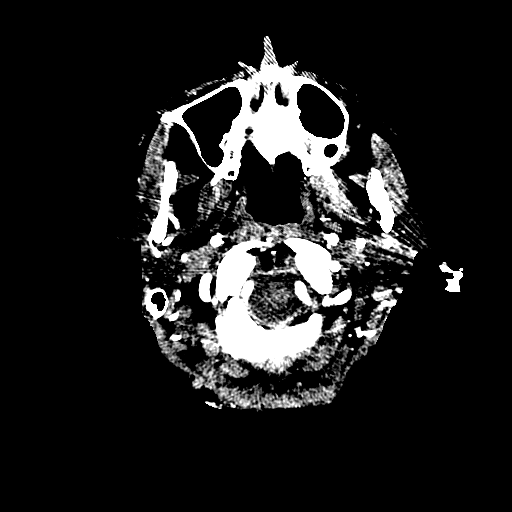
[im 31/170  bone]
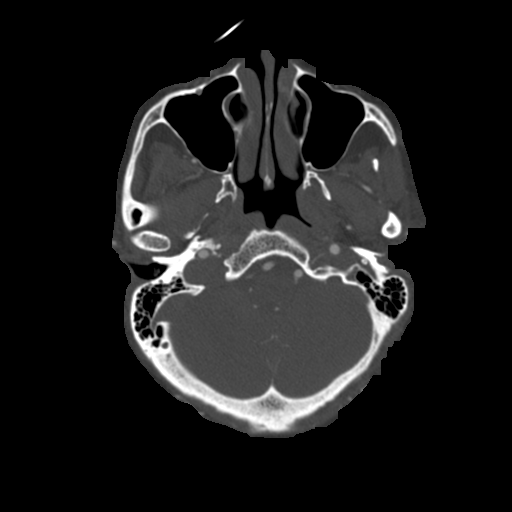
[im 47/170  brain]
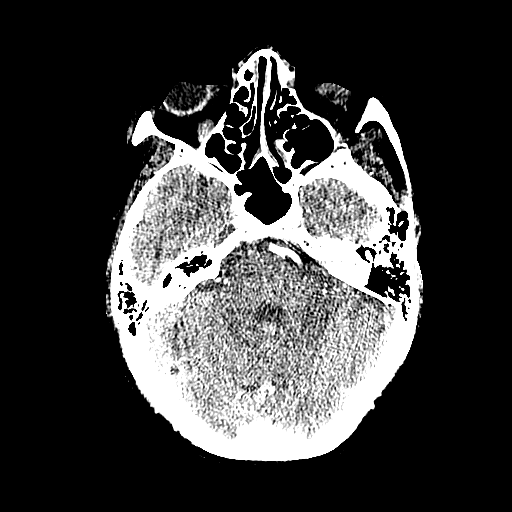
[im 62/170  bone]
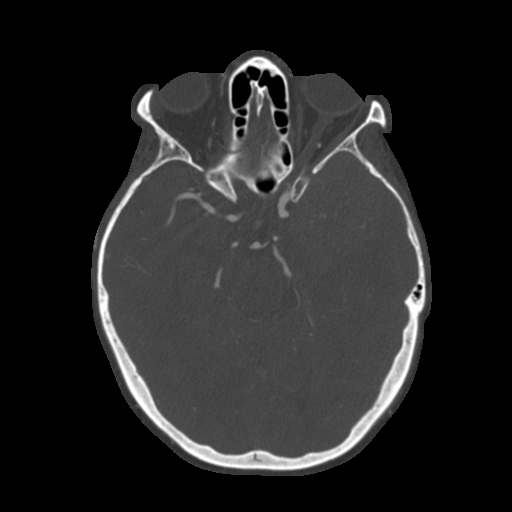
[im 77/170  brain]
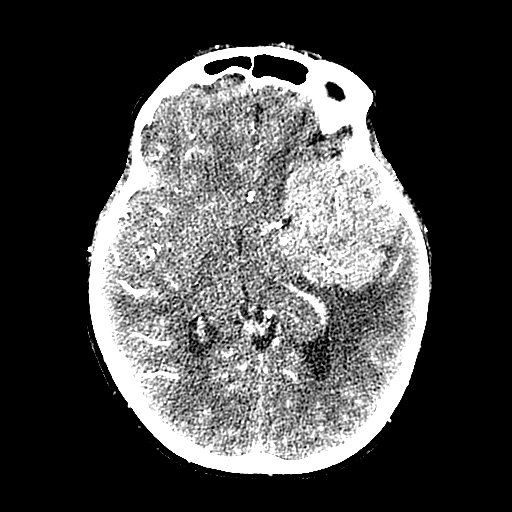
[im 93/170  bone]
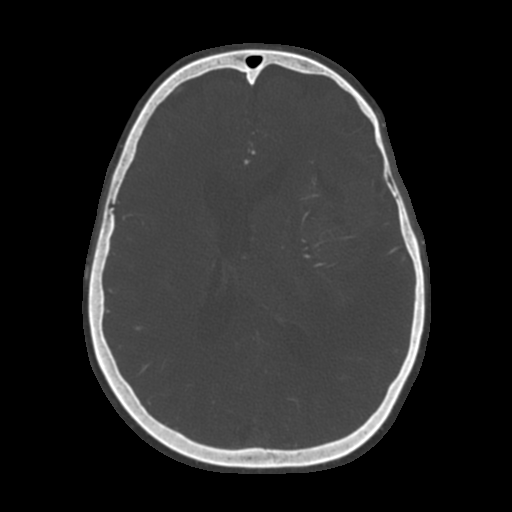
[im 108/170  brain]
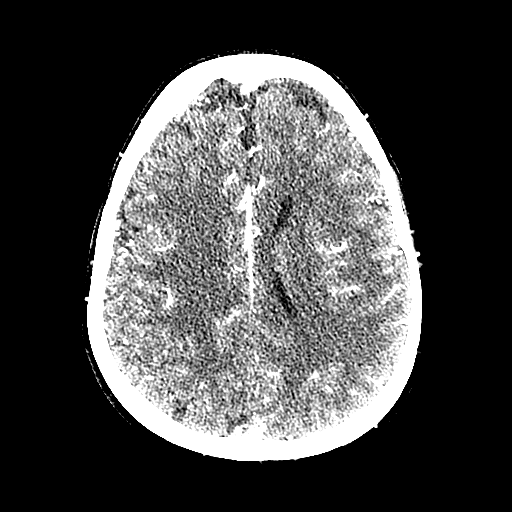
[im 123/170  bone]
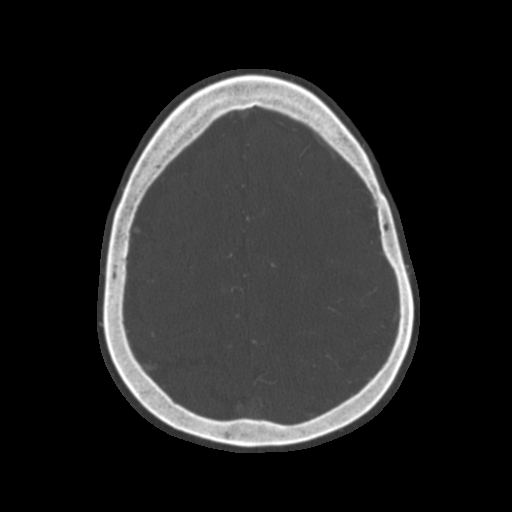
[im 139/170  brain]
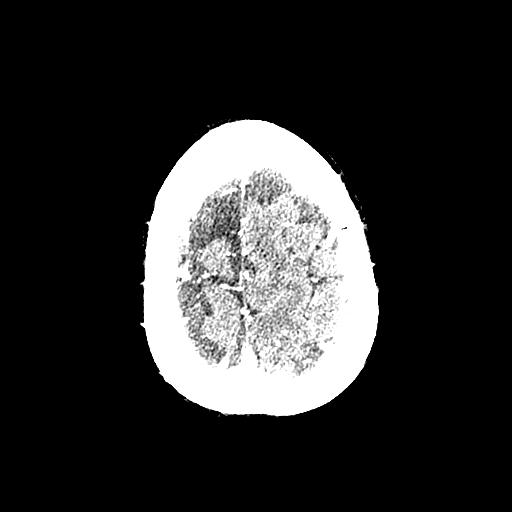
[im 154/170  bone]
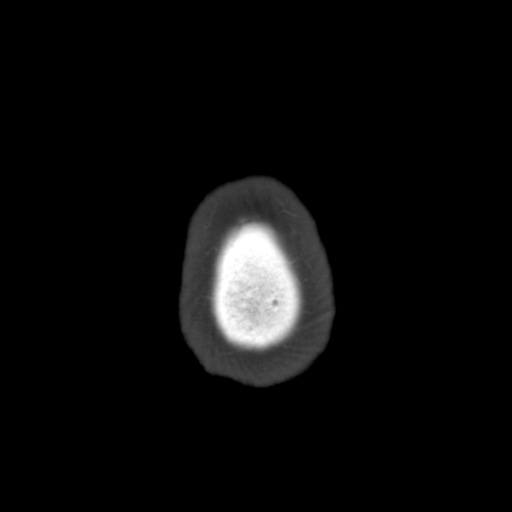

[Series 17: cta brain 1.00 hv48 cor thin mips · coronal · 0.32mm/px · 3 of 205 slices shown]
[im 59/205  brain]
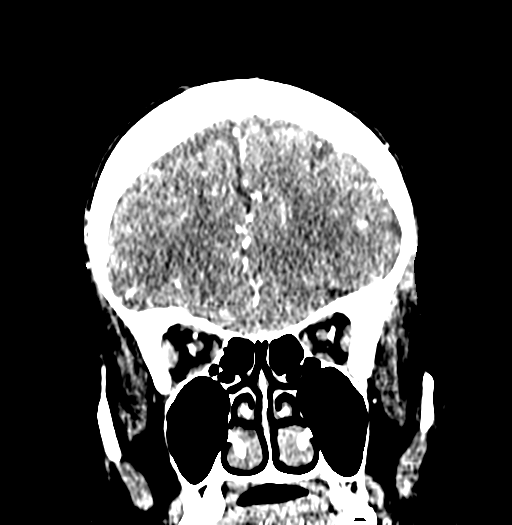
[im 88/205  brain]
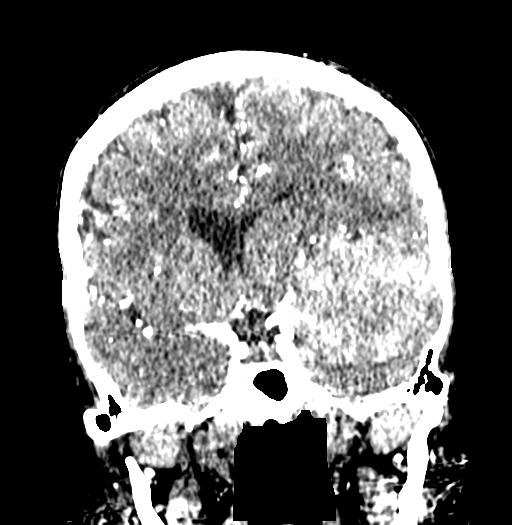
[im 117/205  brain]
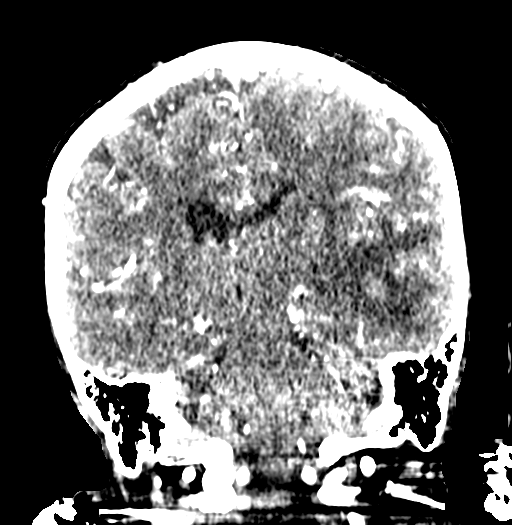

[Series 19: cta brain 1.00 hv48 sag thin mips · sagittal · 0.33mm/px · 3 of 155 slices shown]
[im 39/155  brain]
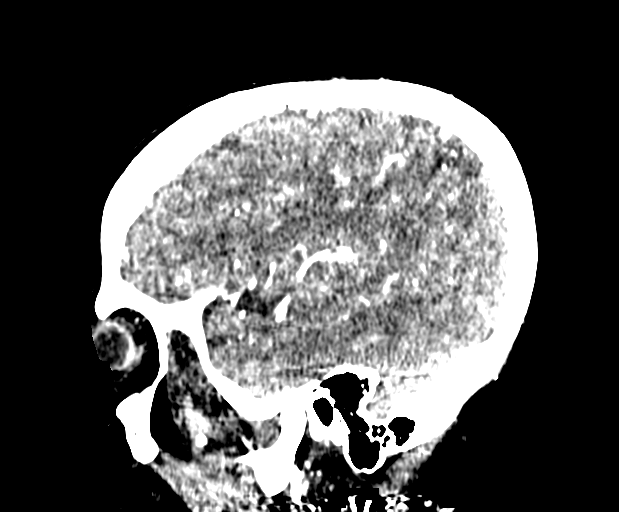
[im 78/155  brain]
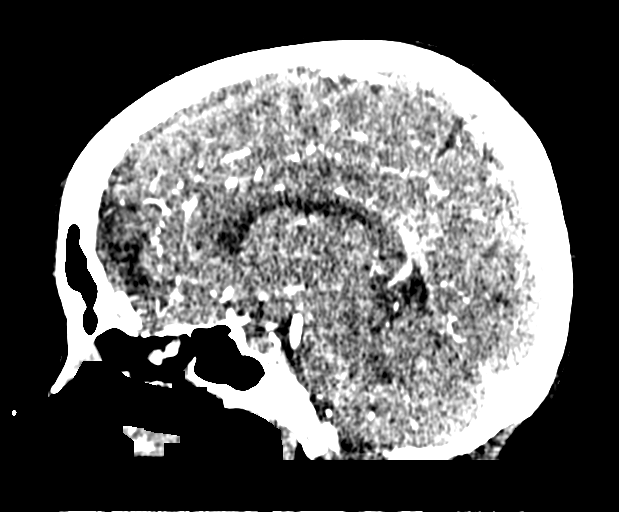
[im 116/155  brain]
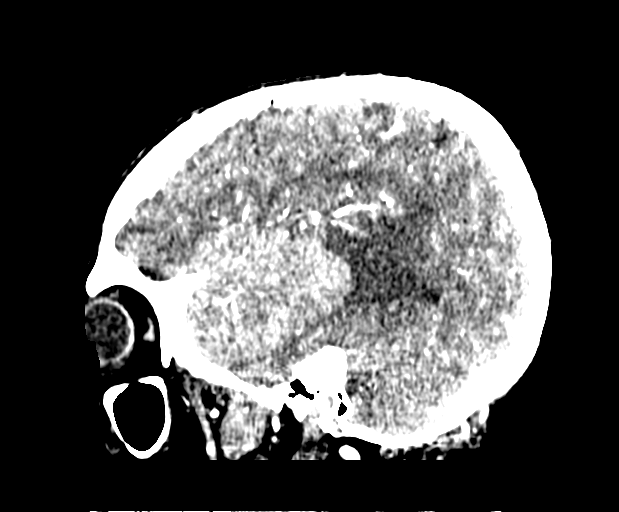

[16 of 47 positions shown; findings below may reference images not displayed]

FINDINGS: CT HEAD

Brain: There is a large mildly hyperdense mass occupying the left
middle cranial fossa measuring similar in size to the recent MRI.
Displacement of adjacent brain parenchyma. Edema is present in the
underlying left temporal lobe extending into the central white
matter and abutting the posterior left lateral ventricle. There is
partial effacement of the left lateral and third ventricles.
Subcentimeter rightward midline shift is present. No substantial
change since the MRI. Gray-white differentiation is preserved.

Vascular: Better evaluated on CTA portion.

Skull: Unremarkable.

Sinuses: No significant abnormality.

Other: Mastoid air cells are clear.

CTA HEAD

Anterior circulation: Intracranial internal carotid arteries are
patent with calcified plaque but no significant stenosis. Sub 2 mm
inferiorly directed outpouching from the distal supraclinoid left
ICA. Anterior and middle cerebral arteries are patent. There is
displacement of the left MCA and branches by the meningioma. Vessels
are seen traversing the mass.

Posterior circulation: Intracranial vertebral arteries, basilar
artery, and posterior cerebral arteries are patent.

Venous sinuses: Not well evaluated due to contrast bolus timing

Review of the MIP images confirms the above findings.
IMPRESSION: Large left middle cranial fossa meningioma and associated mass
effect and parenchymal edema similar to recent MRI. There is
displacement of the left MCA and branches. There are small vessels
traversing the mass.

A small outpouching of the distal supraclinoid left ICA. May reflect
a small aneurysm or infundibulum at the origin of an unseen
diminutive vessel.

## 2021-02-03 MED ORDER — IOPAMIDOL (ISOVUE-370) INJECTION 76%
75.0000 mL | Freq: Once | INTRAVENOUS | Status: AC | PRN
  Administered 2021-02-03: 75 mL via INTRAVENOUS

## 2021-02-16 NOTE — Progress Notes (Signed)
Surgical Instructions    Your procedure is scheduled on Feb 20, 2021.  Report to [facility] Main Entrance "A" at 09:00 A.M., then check in with the Admitting office.  Call this number if you have problems the morning of surgery:  [phone]   If you have any questions prior to your surgery date call [phone]: Open Monday-Friday 8am-4pm   Remember:  Do not eat or drink after midnight the night before your surgery    Take these medicines the morning of surgery with A SIP OF WATER:  . Amlodipine (Norvasc) . Levetiracetam (Keppra)  As of today, STOP taking any Aspirin (unless otherwise instructed by your surgeon) Aleve, Naproxen, Ibuprofen, Motrin, Advil, Goody's, BC's, all herbal medications, fish oil, and all vitamins.                     Do not wear jewelry, make up, or nail polish            Do not wear lotions, powders, perfumes/colognes, or deodorant.            Do not shave 48 hours prior to surgery.              Do not bring valuables to the hospital.            [facility] is not responsible for any belongings or valuables.  Do NOT Smoke (Tobacco/Vaping) or drink Alcohol 24 hours prior to your procedure If you use a CPAP at night, you may bring all equipment for your overnight stay.   Contacts, glasses, dentures or bridgework may not be worn into surgery, please bring cases for these belongings   For patients admitted to the hospital, discharge time will be determined by your treatment team.   Patients discharged the day of surgery will not be allowed to drive home, and someone needs to stay with them for 24 hours.   Special instructions:    Oral Hygiene is also important to reduce your risk of infection.  Remember - BRUSH YOUR TEETH THE MORNING OF SURGERY WITH YOUR REGULAR TOOTHPASTE   [facility] Preparing For [facility]  Before surgery, you can play an important role. Because skin is not sterile, your skin needs to be as free of germs as possible. You can reduce  the number of germs on your skin by washing with CHG (chlorahexidine gluconate) Soap before surgery.  CHG is an antiseptic cleaner which kills germs and bonds with the skin to continue killing germs even after washing.     Please do not use if you have an allergy to CHG or antibacterial soaps. If your skin becomes reddened/irritated stop using the CHG.  Do not shave (including legs and underarms) for at least 48 hours prior to first CHG shower. It is OK to shave your face.  Please follow these instructions carefully.    1.  Shower the NIGHT BEFORE SURGERY and the MORNING OF SURGERY with CHG Soap.   If you chose to wash your hair, wash your hair first as usual with your normal shampoo. After you shampoo, rinse your hair and body thoroughly to remove the shampoo.  Then [employer] and genitals (private parts) with your normal soap and rinse thoroughly to remove soap.  2. After that Use CHG Soap as you would any other liquid soap. You can apply CHG directly to the skin and wash gently with a scrungie or a clean washcloth.   3. Apply the CHG Soap to your  body ONLY FROM THE NECK DOWN.  Do not use on open wounds or open sores. Avoid contact with your eyes, ears, mouth and genitals (private parts).   4. Wash thoroughly, paying special attention to the area where your surgery will be performed.  5. Thoroughly rinse your body with warm water from the neck down.  6. DO NOT shower/wash with your normal soap after using and rinsing off the CHG Soap.  7. Pat yourself dry with a CLEAN TOWEL.  8. Wear CLEAN PAJAMAS to bed the night before surgery  9. Place CLEAN SHEETS on your bed the night before your surgery  10. DO NOT SLEEP WITH PETS.   Day of Surgery:  Take a shower with CHG soap. Wear Clean/Comfortable clothing the morning of surgery Do not apply any deodorants/lotions.   Remember to brush your teeth WITH YOUR REGULAR TOOTHPASTE.   Please read over the following fact sheets that you were  given.

## 2021-02-17 ENCOUNTER — Other Ambulatory Visit: Payer: Self-pay

## 2021-02-17 ENCOUNTER — Encounter (HOSPITAL_COMMUNITY): Payer: Self-pay

## 2021-02-17 ENCOUNTER — Encounter (HOSPITAL_COMMUNITY)
Admission: RE | Admit: 2021-02-17 | Discharge: 2021-02-17 | Disposition: A | Discharge status: Home / Self Care | Payer: Medicare HMO | Source: Ambulatory Visit | Attending: Neurosurgery | Admitting: Neurosurgery

## 2021-02-17 DIAGNOSIS — I10 Essential (primary) hypertension: Secondary | ICD-10-CM | POA: Diagnosis not present

## 2021-02-17 DIAGNOSIS — Z20822 Contact with and (suspected) exposure to covid-19: Secondary | ICD-10-CM | POA: Diagnosis not present

## 2021-02-17 DIAGNOSIS — Z79899 Other long term (current) drug therapy: Secondary | ICD-10-CM | POA: Diagnosis not present

## 2021-02-17 DIAGNOSIS — Z01818 Encounter for other preprocedural examination: Secondary | ICD-10-CM | POA: Insufficient documentation

## 2021-02-17 DIAGNOSIS — G936 Cerebral edema: Secondary | ICD-10-CM | POA: Diagnosis not present

## 2021-02-17 DIAGNOSIS — D320 Benign neoplasm of cerebral meninges: Secondary | ICD-10-CM | POA: Diagnosis not present

## 2021-02-17 HISTORY — DX: Thyrotoxicosis, unspecified without thyrotoxic crisis or storm: E05.90

## 2021-02-17 HISTORY — DX: Essential (primary) hypertension: I10

## 2021-02-17 LAB — BASIC METABOLIC PANEL
Anion gap: 10 (ref 5–15)
BUN: 20 mg/dL (ref 8–23)
CO2: 26 mmol/L (ref 22–32)
Calcium: 9.6 mg/dL (ref 8.9–10.3)
Chloride: 104 mmol/L (ref 98–111)
Creatinine, Ser: 0.62 mg/dL (ref 0.44–1.00)
GFR, Estimated: >60 mL/min (ref >60)
Glucose, Bld: 105 mg/dL — ABNORMAL HIGH (ref 70–99)
Potassium: 3.9 mmol/L (ref 3.5–5.1)
Sodium: 140 mmol/L (ref 135–145)

## 2021-02-17 LAB — CBC
HCT: 49.1 % — ABNORMAL HIGH (ref 36.0–46.0)
Hemoglobin: 16.3 g/dL — ABNORMAL HIGH (ref 12.0–15.0)
MCH: 31.4 pg (ref 26.0–34.0)
MCHC: 33.2 g/dL (ref 30.0–36.0)
MCV: 94.6 fL (ref 80.0–100.0)
Platelets: 200 10*3/uL (ref 150–400)
RBC: 5.19 MIL/uL — ABNORMAL HIGH (ref 3.87–5.11)
RDW: 12.2 % (ref 11.5–15.5)
WBC: 7 10*3/uL (ref 4.0–10.5)
nRBC: 0 % (ref 0.0–0.2)

## 2021-02-17 LAB — SARS CORONAVIRUS 2 (TAT 6-24 HRS): SARS Coronavirus 2: NEGATIVE

## 2021-02-17 LAB — TYPE AND SCREEN
ABO/RH(D): A POS
Antibody Screen: NEGATIVE

## 2021-02-17 NOTE — Progress Notes (Signed)
PCP Carolann Littler, MD Cardiologist - denies  PPM/ICD - denies Device Orders - N/A Rep Notified - N/A  Chest x-ray - N/A EKG - 02/17/2021 Stress Test - 04/02/2014 ECHO - denies Cardiac Cath - denies  Sleep Study - denies CPAP - N/A  Fasting Blood Sugar - N/A  Blood Thinner Instructions: N/A Aspirin Instructions: Patient was instructed: As of today, STOP taking any Aspirin (unless otherwise instructed by your surgeon) Aleve, Naproxen, Ibuprofen, Motrin, Advil, Goody's, BC's, all herbal medications, fish oil, and all vitamins.  ERAS Protcol - No  COVID TEST- 02/17/2021   Anesthesia review: yes  Patient denies shortness of breath, fever, cough and chest pain at PAT appointment   All instructions explained to the patient, with a verbal understanding of the material. Patient agrees to go over the instructions while at home for a better understanding. Patient also instructed to self quarantine after being tested for COVID-19. The opportunity to ask questions was provided.

## 2021-02-19 NOTE — H&P (Signed)
Chief Complaint   No chief complaint on file.   HPI   HPI: Barbara Kim is a 65 y.o. female who was found to have a rather large left sphenoid/sylvia meningioma during work up for episodic right hand tremor & worsening confusion and inattention over the last 6-12 months.  She does not complain of any headaches.  No changes in her vision.  No difficulty walking or bowel or bladder symptoms. Given size of the tumor and associated mass effect and edema, surgical resection was recommended.  She presents today for surgery.  She is without any concerns.  Patient Active Problem List   Diagnosis Date Noted  . Essential hypertension, benign 03/25/2014    PMH: Past Medical History:  Diagnosis Date  . Chicken pox   . Hypertension   . Hyperthyroidism    25 years ago    PSH: Past Surgical History:  Procedure Laterality Date  . TONSILLECTOMY  1983    No medications prior to admission.    SH: Social History   Tobacco Use  . Smoking status: Never Smoker  . Smokeless tobacco: Never Used  Vaping Use  . Vaping Use: Never used  Substance Use Topics  . Alcohol use: No  . Drug use: No    MEDS: Prior to Admission medications   Medication Sig Start Date End Date Taking? Authorizing Provider  amLODipine (NORVASC) 5 MG tablet Take 1 tablet (5 mg total) by mouth daily. 01/19/21  Yes Burchette, Alinda Sierras, MD  levETIRAcetam (KEPPRA) 500 MG tablet Take 500 mg by mouth 2 (two) times daily. 01/29/21  Yes [provider]  Vitamin D, Ergocalciferol, (DRISDOL) 50000 units CAPS capsule TAKE 1 CAPSULE (50,000 UNITS TOTAL) BY MOUTH EVERY 7 (SEVEN) DAYS. Patient not taking: Reported on 02/10/2021 06/03/16   Eulas Post, MD    ALLERGY: No Known Allergies  Social History   Tobacco Use  . Smoking status: Never Smoker  . Smokeless tobacco: Never Used  Substance Use Topics  . Alcohol use: No     Family History  Adopted: Yes     ROS   ROS  Exam   There were no vitals  filed for this visit. General appearance: WDWN, NAD Eyes: No scleral injection Cardiovascular: Regular rate and rhythm without murmurs, rubs, gallops. No edema or variciosities. Distal pulses normal. Pulmonary: Effort normal, non-labored breathing Musculoskeletal:     Muscle tone upper extremities: Normal    Muscle tone lower extremities: Normal    Motor exam: Upper Extremities Deltoid Bicep Tricep Grip  Right 5/5 5/5 5/5 5/5  Left 5/5 5/5 5/5 5/5   Lower Extremity IP Quad PF DF EHL  Right 5/5 5/5 5/5 5/5 5/5  Left 5/5 5/5 5/5 5/5 5/5   Neurological Mental Status:    - Patient is awake, alert, oriented to person, place, month, year, and situation    - Patient is able to give a clear and coherent history.    - No signs of aphasia or neglect Cranial Nerves    - II: Visual Fields are full. PERRL    - III/IV/VI: EOMI without ptosis or diploplia.     - V: Facial sensation is grossly normal    - VII: Facial movement is symmetric.     - VIII: hearing is intact to voice    - X: Uvula elevates symmetrically    - XI: Shoulder shrug is symmetric.    - XII: tongue is midline without atrophy or fasciculations.  Sensory: Sensation grossly  intact to LT  Results - Imaging/Labs   No results found for this or any previous visit (from the past 48 hour(s)).  No results found.  IMAGING: MRI of the brain dated 01/28/2021 was personally reviewed.  This demonstrates a avid homogeneously enhancing mass of the left sphenoid wing measuring up to 5-6 cm in maximal dimension.  The tumor exerts mass effect upon both the temporal and frontal opercular.  There is effacement of the left lateral ventricle.  The tumor appears to abut the internal carotid artery and branches of the middle cerebral artery appeared to be coursing along the medial aspect of the tumor.  There is associated surrounding edema primarily posteriorly.  There is a component of uncal herniation and mild mass effect upon the  midbrain.  CTA demonstrates displacement of the MCA candelabra to the medial and posterior margin of the tumor.  Impression/Plan   65 y.o. female with right hand tremor and somewhat more chronic mild confusion/cognitive change likely related to the presence of a large left sphenoid/sylvian meningioma.  There is associated mass effect and edema.  This will require surgical resection.  We will proceed with with stereotactic left frontotemporal craniotomy for resection of the meningioma  We have reviewed the indications for surgery, the associated risks, benefits and alternatives at length in the office.  All questions today were answered and consent was obtained.  Consuella Lose, MD [facility]

## 2021-02-20 ENCOUNTER — Inpatient Hospital Stay (HOSPITAL_COMMUNITY)
Admission: RE | Admit: 2021-02-20 | Discharge: 2021-02-23 | DRG: 025 | Disposition: A | Discharge status: Home Health | Payer: Medicare HMO | Attending: Neurosurgery | Admitting: Neurosurgery

## 2021-02-20 ENCOUNTER — Other Ambulatory Visit: Payer: Self-pay

## 2021-02-20 ENCOUNTER — Inpatient Hospital Stay (HOSPITAL_COMMUNITY): Payer: Medicare HMO | Admitting: Certified Registered Nurse Anesthetist

## 2021-02-20 ENCOUNTER — Inpatient Hospital Stay (HOSPITAL_COMMUNITY)
Admission: RE | Disposition: A | Discharge status: Home / Self Care | Payer: Self-pay | Source: Ambulatory Visit | Attending: Neurosurgery

## 2021-02-20 ENCOUNTER — Inpatient Hospital Stay (HOSPITAL_COMMUNITY): Payer: Medicare HMO | Admitting: Physician Assistant

## 2021-02-20 ENCOUNTER — Encounter (HOSPITAL_COMMUNITY): Payer: Self-pay | Admitting: Neurosurgery

## 2021-02-20 DIAGNOSIS — D496 Neoplasm of unspecified behavior of brain: Secondary | ICD-10-CM | POA: Diagnosis present

## 2021-02-20 DIAGNOSIS — I10 Essential (primary) hypertension: Secondary | ICD-10-CM | POA: Diagnosis present

## 2021-02-20 DIAGNOSIS — Z20822 Contact with and (suspected) exposure to covid-19: Secondary | ICD-10-CM | POA: Diagnosis not present

## 2021-02-20 DIAGNOSIS — D329 Benign neoplasm of meninges, unspecified: Secondary | ICD-10-CM | POA: Diagnosis not present

## 2021-02-20 DIAGNOSIS — G936 Cerebral edema: Secondary | ICD-10-CM | POA: Diagnosis not present

## 2021-02-20 DIAGNOSIS — Z79899 Other long term (current) drug therapy: Secondary | ICD-10-CM

## 2021-02-20 DIAGNOSIS — I639 Cerebral infarction, unspecified: Secondary | ICD-10-CM | POA: Diagnosis not present

## 2021-02-20 DIAGNOSIS — D320 Benign neoplasm of cerebral meninges: Secondary | ICD-10-CM | POA: Diagnosis not present

## 2021-02-20 DIAGNOSIS — G9389 Other specified disorders of brain: Secondary | ICD-10-CM | POA: Diagnosis not present

## 2021-02-20 DIAGNOSIS — E0590 Thyrotoxicosis, unspecified without thyrotoxic crisis or storm: Secondary | ICD-10-CM | POA: Diagnosis not present

## 2021-02-20 HISTORY — PX: CRANIOTOMY: SHX93

## 2021-02-20 HISTORY — PX: APPLICATION OF CRANIAL NAVIGATION: SHX6578

## 2021-02-20 LAB — ABO/RH: ABO/RH(D): A POS

## 2021-02-20 SURGERY — CRANIOTOMY TUMOR EXCISION
Anesthesia: General | Laterality: Left

## 2021-02-20 MED ORDER — MANNITOL 25 % IV SOLN
INTRAVENOUS | Status: DC | PRN
  Administered 2021-02-20: 25 g via INTRAVENOUS

## 2021-02-20 MED ORDER — CEFAZOLIN SODIUM-DEXTROSE 2-4 GM/100ML-% IV SOLN
2.0000 g | INTRAVENOUS | Status: AC
Start: 2021-02-20 — End: 2021-02-21
  Filled 2021-02-20: qty 100

## 2021-02-20 MED ORDER — SUGAMMADEX SODIUM 200 MG/2ML IV SOLN
INTRAVENOUS | Status: DC | PRN
  Administered 2021-02-20 (×2): 100 mg via INTRAVENOUS

## 2021-02-20 MED ORDER — HYDROMORPHONE HCL 1 MG/ML IJ SOLN: 0.5000 mg | INTRAMUSCULAR | Status: DC | PRN

## 2021-02-20 MED ORDER — AMLODIPINE BESYLATE 5 MG PO TABS
5.0000 mg | ORAL_TABLET | Freq: Every day | ORAL | Status: DC
  Administered 2021-02-21 – 2021-02-23 (×3): 5 mg via ORAL
  Filled 2021-02-20 (×3): qty 1

## 2021-02-20 MED ORDER — FLEET ENEMA 7-19 GM/118ML RE ENEM: 1.0000 | ENEMA | Freq: Once | RECTAL | Status: DC | PRN

## 2021-02-20 MED ORDER — CHLORHEXIDINE GLUCONATE CLOTH 2 % EX PADS: 6.0000 | MEDICATED_PAD | Freq: Once | CUTANEOUS | Status: DC

## 2021-02-20 MED ORDER — SENNA 8.6 MG PO TABS
1.0000 | ORAL_TABLET | Freq: Two times a day (BID) | ORAL | Status: DC
  Administered 2021-02-20 – 2021-02-23 (×4): 8.6 mg via ORAL
  Filled 2021-02-20 (×4): qty 1

## 2021-02-20 MED ORDER — HYDROCODONE-ACETAMINOPHEN 5-325 MG PO TABS
1.0000 | ORAL_TABLET | ORAL | Status: DC | PRN
  Administered 2021-02-21 – 2021-02-22 (×3): 1 via ORAL
  Filled 2021-02-20 (×3): qty 1

## 2021-02-20 MED ORDER — POLYETHYLENE GLYCOL 3350 17 G PO PACK: 17.0000 g | PACK | Freq: Every day | ORAL | Status: DC | PRN

## 2021-02-20 MED ORDER — PROMETHAZINE HCL 25 MG/ML IJ SOLN: 6.2500 mg | INTRAMUSCULAR | Status: DC | PRN

## 2021-02-20 MED ORDER — ONDANSETRON HCL 4 MG PO TABS: 4.0000 mg | ORAL_TABLET | ORAL | Status: DC | PRN

## 2021-02-20 MED ORDER — PROMETHAZINE HCL 25 MG PO TABS: 12.5000 mg | ORAL_TABLET | ORAL | Status: DC | PRN

## 2021-02-20 MED ORDER — ACETAMINOPHEN 500 MG PO TABS: 1000.0000 mg | ORAL_TABLET | Freq: Once | ORAL | Status: AC

## 2021-02-20 MED ORDER — BUPIVACAINE HCL (PF) 0.5 % IJ SOLN
INTRAMUSCULAR | Status: AC
  Filled 2021-02-20: qty 30

## 2021-02-20 MED ORDER — BUPIVACAINE HCL (PF) 0.5 % IJ SOLN
INTRAMUSCULAR | Status: DC | PRN
  Administered 2021-02-20: 5 mL

## 2021-02-20 MED ORDER — PHENYLEPHRINE HCL-NACL 10-0.9 MG/250ML-% IV SOLN
INTRAVENOUS | Status: DC | PRN
  Administered 2021-02-20: 20 ug/min via INTRAVENOUS
  Administered 2021-02-20: 25 ug/min via INTRAVENOUS

## 2021-02-20 MED ORDER — LIDOCAINE 2% (20 MG/ML) 5 ML SYRINGE
INTRAMUSCULAR | Status: AC
  Filled 2021-02-20: qty 5

## 2021-02-20 MED ORDER — FENTANYL CITRATE (PF) 250 MCG/5ML IJ SOLN
INTRAMUSCULAR | Status: DC | PRN
  Administered 2021-02-20: 50 ug via INTRAVENOUS
  Administered 2021-02-20: 150 ug via INTRAVENOUS
  Administered 2021-02-20: 50 ug via INTRAVENOUS

## 2021-02-20 MED ORDER — ACETAMINOPHEN 650 MG RE SUPP: 650.0000 mg | RECTAL | Status: DC | PRN

## 2021-02-20 MED ORDER — ACETAMINOPHEN 325 MG PO TABS
650.0000 mg | ORAL_TABLET | ORAL | Status: DC | PRN
  Administered 2021-02-21 (×2): 325 mg via ORAL
  Filled 2021-02-20 (×2): qty 2

## 2021-02-20 MED ORDER — SODIUM CHLORIDE 0.9 % IV SOLN: INTRAVENOUS | Status: DC | PRN

## 2021-02-20 MED ORDER — BACITRACIN ZINC 500 UNIT/GM EX OINT
TOPICAL_OINTMENT | CUTANEOUS | Status: DC | PRN
  Administered 2021-02-20: 1 via TOPICAL

## 2021-02-20 MED ORDER — SODIUM CHLORIDE 0.9 % IV SOLN: INTRAVENOUS | Status: DC

## 2021-02-20 MED ORDER — ONDANSETRON HCL 4 MG/2ML IJ SOLN: 4.0000 mg | INTRAMUSCULAR | Status: DC | PRN

## 2021-02-20 MED ORDER — CHLORHEXIDINE GLUCONATE 0.12 % MT SOLN
15.0000 mL | Freq: Once | OROMUCOSAL | Status: AC
  Administered 2021-02-20: 15 mL via OROMUCOSAL
  Filled 2021-02-20: qty 15

## 2021-02-20 MED ORDER — ONDANSETRON HCL 4 MG/2ML IJ SOLN
INTRAMUSCULAR | Status: DC | PRN
  Administered 2021-02-20: 4 mg via INTRAVENOUS

## 2021-02-20 MED ORDER — SUGAMMADEX SODIUM 200 MG/2ML IV SOLN: INTRAVENOUS | Status: DC | PRN

## 2021-02-20 MED ORDER — CHLORHEXIDINE GLUCONATE CLOTH 2 % EX PADS
6.0000 | MEDICATED_PAD | Freq: Once | CUTANEOUS | Status: AC
  Administered 2021-02-22: 6 via TOPICAL

## 2021-02-20 MED ORDER — PROPOFOL 10 MG/ML IV BOLUS
INTRAVENOUS | Status: DC | PRN
  Administered 2021-02-20: 50 mg via INTRAVENOUS
  Administered 2021-02-20: 160 mg via INTRAVENOUS
  Administered 2021-02-20: 20 mg via INTRAVENOUS

## 2021-02-20 MED ORDER — PROPOFOL 10 MG/ML IV BOLUS
INTRAVENOUS | Status: AC
  Filled 2021-02-20: qty 20

## 2021-02-20 MED ORDER — ORAL CARE MOUTH RINSE: 15.0000 mL | Freq: Once | OROMUCOSAL | Status: AC

## 2021-02-20 MED ORDER — THROMBIN 20000 UNITS EX KIT
PACK | CUTANEOUS | Status: AC
  Filled 2021-02-20: qty 1

## 2021-02-20 MED ORDER — DEXAMETHASONE SODIUM PHOSPHATE 10 MG/ML IJ SOLN
INTRAMUSCULAR | Status: DC | PRN
  Administered 2021-02-20: 10 mg via INTRAVENOUS

## 2021-02-20 MED ORDER — CLEVIDIPINE BUTYRATE 0.5 MG/ML IV EMUL
INTRAVENOUS | Status: DC | PRN
  Administered 2021-02-20: 2 mg/h via INTRAVENOUS

## 2021-02-20 MED ORDER — LABETALOL HCL 5 MG/ML IV SOLN: 10.0000 mg | INTRAVENOUS | Status: DC | PRN

## 2021-02-20 MED ORDER — THROMBIN 20000 UNITS EX SOLR: CUTANEOUS | Status: DC | PRN

## 2021-02-20 MED ORDER — CEFAZOLIN SODIUM-DEXTROSE 2-3 GM-%(50ML) IV SOLR
INTRAVENOUS | Status: DC | PRN
  Administered 2021-02-20: 2 g via INTRAVENOUS

## 2021-02-20 MED ORDER — 0.9 % SODIUM CHLORIDE (POUR BTL) OPTIME
TOPICAL | Status: DC | PRN
  Administered 2021-02-20 (×4): 1000 mL

## 2021-02-20 MED ORDER — LIDOCAINE-EPINEPHRINE 1 %-1:100000 IJ SOLN
INTRAMUSCULAR | Status: AC
  Filled 2021-02-20: qty 1

## 2021-02-20 MED ORDER — THROMBIN 5000 UNITS EX SOLR: OROMUCOSAL | Status: DC | PRN

## 2021-02-20 MED ORDER — CEFAZOLIN SODIUM-DEXTROSE 2-4 GM/100ML-% IV SOLN
2.0000 g | Freq: Three times a day (TID) | INTRAVENOUS | Status: AC
  Administered 2021-02-20 – 2021-02-21 (×2): 2 g via INTRAVENOUS
  Filled 2021-02-20 (×2): qty 100

## 2021-02-20 MED ORDER — LEVETIRACETAM 500 MG PO TABS
500.0000 mg | ORAL_TABLET | Freq: Two times a day (BID) | ORAL | Status: DC
  Administered 2021-02-20 – 2021-02-23 (×6): 500 mg via ORAL
  Filled 2021-02-20 (×6): qty 1

## 2021-02-20 MED ORDER — ONDANSETRON HCL 4 MG/2ML IJ SOLN
INTRAMUSCULAR | Status: AC
  Filled 2021-02-20: qty 2

## 2021-02-20 MED ORDER — GLYCOPYRROLATE PF 0.2 MG/ML IJ SOSY
PREFILLED_SYRINGE | INTRAMUSCULAR | Status: DC | PRN
  Administered 2021-02-20: .2 mg via INTRAVENOUS

## 2021-02-20 MED ORDER — ACETAMINOPHEN 500 MG PO TABS
ORAL_TABLET | ORAL | Status: AC
  Administered 2021-02-20: 1000 mg via ORAL
  Filled 2021-02-20: qty 2

## 2021-02-20 MED ORDER — HYDRALAZINE HCL 20 MG/ML IJ SOLN: 5.0000 mg | INTRAMUSCULAR | Status: DC | PRN

## 2021-02-20 MED ORDER — NALOXONE HCL 0.4 MG/ML IJ SOLN: 0.0800 mg | INTRAMUSCULAR | Status: DC | PRN

## 2021-02-20 MED ORDER — FENTANYL CITRATE (PF) 100 MCG/2ML IJ SOLN: 25.0000 ug | INTRAMUSCULAR | Status: DC | PRN

## 2021-02-20 MED ORDER — HEMOSTATIC AGENTS (NO CHARGE) OPTIME
TOPICAL | Status: DC | PRN
  Administered 2021-02-20 (×2): 1 via TOPICAL

## 2021-02-20 MED ORDER — BACITRACIN ZINC 500 UNIT/GM EX OINT
TOPICAL_OINTMENT | CUTANEOUS | Status: AC
  Filled 2021-02-20: qty 28.35

## 2021-02-20 MED ORDER — BISACODYL 5 MG PO TBEC: 5.0000 mg | DELAYED_RELEASE_TABLET | Freq: Every day | ORAL | Status: DC | PRN

## 2021-02-20 MED ORDER — PANTOPRAZOLE SODIUM 40 MG IV SOLR
40.0000 mg | Freq: Every day | INTRAVENOUS | Status: DC
  Administered 2021-02-20 – 2021-02-21 (×2): 40 mg via INTRAVENOUS
  Filled 2021-02-20 (×2): qty 40

## 2021-02-20 MED ORDER — LACTATED RINGERS IV SOLN: INTRAVENOUS | Status: DC

## 2021-02-20 MED ORDER — THROMBIN 5000 UNITS EX SOLR
CUTANEOUS | Status: AC
  Filled 2021-02-20: qty 5000

## 2021-02-20 MED ORDER — FENTANYL CITRATE (PF) 250 MCG/5ML IJ SOLN
INTRAMUSCULAR | Status: AC
  Filled 2021-02-20: qty 5

## 2021-02-20 MED ORDER — EPHEDRINE SULFATE-NACL 50-0.9 MG/10ML-% IV SOSY
PREFILLED_SYRINGE | INTRAVENOUS | Status: DC | PRN
  Administered 2021-02-20: 10 mg via INTRAVENOUS

## 2021-02-20 MED ORDER — LIDOCAINE-EPINEPHRINE 1 %-1:100000 IJ SOLN
INTRAMUSCULAR | Status: DC | PRN
  Administered 2021-02-20: 5 mL

## 2021-02-20 MED ORDER — ROCURONIUM BROMIDE 10 MG/ML (PF) SYRINGE
PREFILLED_SYRINGE | INTRAVENOUS | Status: DC | PRN
  Administered 2021-02-20: 20 mg via INTRAVENOUS
  Administered 2021-02-20 (×2): 40 mg via INTRAVENOUS
  Administered 2021-02-20: 20 mg via INTRAVENOUS

## 2021-02-20 MED ORDER — LIDOCAINE 2% (20 MG/ML) 5 ML SYRINGE
INTRAMUSCULAR | Status: DC | PRN
  Administered 2021-02-20: 40 mg via INTRAVENOUS

## 2021-02-20 MED ORDER — THROMBIN 5000 UNITS EX SOLR
CUTANEOUS | Status: AC
  Filled 2021-02-20: qty 10000

## 2021-02-20 SURGICAL SUPPLY — 108 items
APL SKNCLS STERI-STRIP NONHPOA (GAUZE/BANDAGES/DRESSINGS)
BAND INSRT 18 STRL LF DISP RB (MISCELLANEOUS) ×2
BAND RUBBER #18 3X1/16 STRL (MISCELLANEOUS) ×4 IMPLANT
BENZOIN TINCTURE PRP APPL 2/3 (GAUZE/BANDAGES/DRESSINGS) IMPLANT
BLADE CLIPPER SURG (BLADE) ×2 IMPLANT
BLADE SAW GIGLI 16 STRL (MISCELLANEOUS) IMPLANT
BLADE SURG 15 STRL LF DISP TIS (BLADE) IMPLANT
BLADE SURG 15 STRL SS (BLADE)
BLADE ULTRA TIP 2M (BLADE) ×2 IMPLANT
BNDG CMPR 75X41 PLY ABS (GAUZE/BANDAGES/DRESSINGS) ×1
BNDG CMPR 75X41 PLY HI ABS (GAUZE/BANDAGES/DRESSINGS) ×1
BNDG GAUZE ELAST 4 BULKY (GAUZE/BANDAGES/DRESSINGS) ×8 IMPLANT
BNDG STRETCH 4X75 NS LF (GAUZE/BANDAGES/DRESSINGS) ×2 IMPLANT
BNDG STRETCH 4X75 STRL LF (GAUZE/BANDAGES/DRESSINGS) ×2 IMPLANT
BUR ACORN 6.0 PRECISION (BURR) ×2 IMPLANT
BUR ROUND FLUTED 4 SOFT TCH (BURR) IMPLANT
BUR SABER RD CUTTING 3.0 (BURR) ×2 IMPLANT
BUR SPIRAL ROUTER 2.3 (BUR) IMPLANT
CANISTER SUCT 3000ML PPV (MISCELLANEOUS) ×4 IMPLANT
CARTRIDGE OIL MAESTRO DRILL (MISCELLANEOUS) ×1 IMPLANT
CATH VENTRIC 35X38 W/TROCAR LG (CATHETERS) IMPLANT
CLIP VESOCCLUDE MED 6/CT (CLIP) IMPLANT
CNTNR URN SCR LID CUP LEK RST (MISCELLANEOUS) ×1 IMPLANT
CONT SPEC 4OZ STRL OR WHT (MISCELLANEOUS) ×2
COVER MAYO STAND STRL (DRAPES) IMPLANT
COVER WAND RF STERILE (DRAPES) ×2 IMPLANT
DECANTER SPIKE VIAL GLASS SM (MISCELLANEOUS) ×2 IMPLANT
DIFFUSER DRILL AIR PNEUMATIC (MISCELLANEOUS) ×2 IMPLANT
DRAIN SUBARACHNOID (WOUND CARE) IMPLANT
DRAPE HALF SHEET 40X57 (DRAPES) ×2 IMPLANT
DRAPE MICROSCOPE LEICA (MISCELLANEOUS) ×2 IMPLANT
DRAPE NEUROLOGICAL W/INCISE (DRAPES) ×2 IMPLANT
DRAPE STERI IOBAN 125X83 (DRAPES) ×2 IMPLANT
DRAPE SURG 17X23 STRL (DRAPES) IMPLANT
DRAPE WARM FLUID 44X44 (DRAPES) ×2 IMPLANT
DRSG ADAPTIC 3X8 NADH LF (GAUZE/BANDAGES/DRESSINGS) IMPLANT
DRSG TELFA 3X8 NADH (GAUZE/BANDAGES/DRESSINGS) ×2 IMPLANT
DURAPREP 6ML APPLICATOR 50/CS (WOUND CARE) ×2 IMPLANT
ELECT REM PT RETURN 9FT ADLT (ELECTROSURGICAL) ×2
ELECTRODE REM PT RTRN 9FT ADLT (ELECTROSURGICAL) ×1 IMPLANT
EVACUATOR 1/8 PVC DRAIN (DRAIN) IMPLANT
EVACUATOR SILICONE 100CC (DRAIN) IMPLANT
FORCEPS BIPOLAR SPETZLER 8 1.0 (NEUROSURGERY SUPPLIES) ×2 IMPLANT
GAUZE 4X4 16PLY RFD (DISPOSABLE) IMPLANT
GAUZE SPONGE 4X4 12PLY STRL (GAUZE/BANDAGES/DRESSINGS) ×2 IMPLANT
GLOVE BIO SURGEON STRL SZ7.5 (GLOVE) IMPLANT
GLOVE BIOGEL PI IND STRL 7.5 (GLOVE) ×2 IMPLANT
GLOVE BIOGEL PI INDICATOR 7.5 (GLOVE) ×2
GLOVE ECLIPSE 7.0 STRL STRAW (GLOVE) ×4 IMPLANT
GLOVE EXAM NITRILE XL STR (GLOVE) IMPLANT
GLOVE SURG UNDER POLY LF SZ7 (GLOVE) ×2 IMPLANT
GOWN STRL REUS W/ TWL LRG LVL3 (GOWN DISPOSABLE) ×3 IMPLANT
GOWN STRL REUS W/ TWL XL LVL3 (GOWN DISPOSABLE) ×1 IMPLANT
GOWN STRL REUS W/TWL 2XL LVL3 (GOWN DISPOSABLE) IMPLANT
GOWN STRL REUS W/TWL LRG LVL3 (GOWN DISPOSABLE) ×6
GOWN STRL REUS W/TWL XL LVL3 (GOWN DISPOSABLE) ×2
GRAFT DURAGEN MATRIX 3WX3L (Graft) ×2 IMPLANT
GRAFT DURAGEN MATRIX 3X3 SNGL (Graft) ×1 IMPLANT
HEMOSTAT POWDER KIT SURGIFOAM (HEMOSTASIS) ×4 IMPLANT
HEMOSTAT SURGICEL 2X14 (HEMOSTASIS) ×2 IMPLANT
HOOK DURA 1/2IN (MISCELLANEOUS) ×2 IMPLANT
IV NS 1000ML (IV SOLUTION) ×2
IV NS 1000ML BAXH (IV SOLUTION) ×1 IMPLANT
KIT BASIN OR (CUSTOM PROCEDURE TRAY) ×2 IMPLANT
KIT DRAIN CSF ACCUDRAIN (MISCELLANEOUS) IMPLANT
KIT TURNOVER KIT B (KITS) ×2 IMPLANT
KNIFE ARACHNOID DISP AM-24-S (MISCELLANEOUS) ×2 IMPLANT
MARKER SPHERE PSV REFLC 13MM (MARKER) ×4 IMPLANT
NEEDLE HYPO 22GX1.5 SAFETY (NEEDLE) ×2 IMPLANT
NEEDLE SPNL 18GX3.5 QUINCKE PK (NEEDLE) IMPLANT
NS IRRIG 1000ML POUR BTL (IV SOLUTION) ×8 IMPLANT
OIL CARTRIDGE MAESTRO DRILL (MISCELLANEOUS) ×2
PACK BATTERY CMF DISP FOR DVR (ORTHOPEDIC DISPOSABLE SUPPLIES) ×2 IMPLANT
PACK CRANIOTOMY CUSTOM (CUSTOM PROCEDURE TRAY) ×2 IMPLANT
PATTIES SURGICAL .25X.25 (GAUZE/BANDAGES/DRESSINGS) IMPLANT
PATTIES SURGICAL .5 X.5 (GAUZE/BANDAGES/DRESSINGS) ×2 IMPLANT
PATTIES SURGICAL .5 X3 (DISPOSABLE) ×4 IMPLANT
PATTIES SURGICAL 1/4 X 3 (GAUZE/BANDAGES/DRESSINGS) IMPLANT
PATTIES SURGICAL 1X1 (DISPOSABLE) IMPLANT
PIN MAYFIELD SKULL DISP (PIN) ×2 IMPLANT
PLATE CRANIAL UNIV 8H (Plate) ×2 IMPLANT
PLATE UNIV CMF 16 2H (Plate) ×4 IMPLANT
SCREW UNIII AXS SD 1.5X4 (Screw) ×6 IMPLANT
SET CARTRIDGE AND TUBING (SET/KITS/TRAYS/PACK) ×2 IMPLANT
SPECIMEN JAR SMALL (MISCELLANEOUS) IMPLANT
SPONGE NEURO XRAY DETECT 1X3 (DISPOSABLE) ×2 IMPLANT
SPONGE SURGIFOAM ABS GEL 100 (HEMOSTASIS) ×4 IMPLANT
STAPLER VISISTAT 35W (STAPLE) ×4 IMPLANT
STOCKINETTE 6  STRL (DRAPES)
STOCKINETTE 6 STRL (DRAPES) IMPLANT
SUT ETHILON 3 0 FSL (SUTURE) IMPLANT
SUT ETHILON 3 0 PS 1 (SUTURE) IMPLANT
SUT NURALON 4 0 TR CR/8 (SUTURE) ×4 IMPLANT
SUT SILK 0 TIES 10X30 (SUTURE) IMPLANT
SUT VIC AB 0 CT1 18XCR BRD8 (SUTURE) ×2 IMPLANT
SUT VIC AB 0 CT1 8-18 (SUTURE) ×4
SUT VIC AB 3-0 SH 8-18 (SUTURE) ×4 IMPLANT
TAPE CLOTH 1X10 TAN NS (GAUZE/BANDAGES/DRESSINGS) IMPLANT
TIP STANDARD 36KHZ (INSTRUMENTS) ×2
TIP STD 36KHZ (INSTRUMENTS) ×1 IMPLANT
TOWEL GREEN STERILE (TOWEL DISPOSABLE) ×2 IMPLANT
TOWEL GREEN STERILE FF (TOWEL DISPOSABLE) ×2 IMPLANT
TRAY FOLEY MTR SLVR 14FR STAT (SET/KITS/TRAYS/PACK) ×2 IMPLANT
TRAY FOLEY MTR SLVR 16FR STAT (SET/KITS/TRAYS/PACK) IMPLANT
TUBE CONNECTING 12X1/4 (SUCTIONS) ×2 IMPLANT
UNDERPAD 30X36 HEAVY ABSORB (UNDERPADS AND DIAPERS) ×2 IMPLANT
WATER STERILE IRR 1000ML POUR (IV SOLUTION) ×2 IMPLANT
WRENCH TORQUE 36KHZ (INSTRUMENTS) ×2 IMPLANT

## 2021-02-20 NOTE — Op Note (Signed)
NEUROSURGERY OPERATIVE NOTE   PREOP DIAGNOSIS:  Left sphenoid wing meningioma   POSTOP DIAGNOSIS: Same  PROCEDURE: Stereotactic left frontotemporal craniotomy for resection of meningioma Use of intraoperative microscope for microdissection  SURGEON: Dr. Consuella Lose, MD  ASSISTANT: Ferne Reus, PA-C  ANESTHESIA: General Endotracheal  EBL: 200cc  SPECIMENS: Meningioma for permanent pathology  DRAINS: None  COMPLICATIONS: None immediate  CONDITION: Hemodynamically stable to PACU  HISTORY: Barbara Kim is a 65 y.o. female initially seen in the outpatient neurosurgery clinic as a referral from her primary care doctor.  She had some mild cognitive complaints and some headache and MRI was completed demonstrating a large left frontotemporal meningioma.  There is associated brain edema.  Given the size of the lesion associated mass-effect and presence of edema, surgical resection was indicated.  The risks, benefits, and alternatives to surgery were reviewed in detail with both the patient and her husband.  After all her questions were answered informed consent was obtained and witnessed.  PROCEDURE IN DETAIL: The patient was brought to the operating room. After induction of general anesthesia, the patient was positioned on the operative table in the [place] head holder in the prone position. All pressure points were meticulously padded.  Utilizing the preoperative stereotactic CT angiogram fused with the preoperative MRI scan, surface markers were coregistered with the stereotactic system until a satisfactory accuracy was achieved.  The stereotactic system was then used to plan out a craniotomy incision specifically the posterior extent of the incision in order to allow access to the tumor.  Skin incision was then marked out and prepped and draped in the usual sterile fashion.  After timeout was conducted, the incision was infiltrated with local anesthetic with epinephrine.   Incision was then made sharply and carried down through the galea.  Raney clips were applied.  The temporalis muscle and fascia were then incised with the Bovie as was the periosteum.  A single piece myocutaneous flap was then elevated and reflected anteriorly.  Bur holes were then created on the pterion as well as over the superior temporal line and just above the root of the zygoma.  Craniotome was then used to connect the bur holes and a single piece pterional bone flap was elevated.  Hemostasis on the epidural plane was secured with bipolar electrocautery and morselized Gelfoam and thrombin.  High-speed drill was then used to drill down the sphenoid until the meningo-orbital ligament was identified.  This would then allow direct visualization of the optical carotid cistern.  At this point, the dura was incised and opened in curvilinear fashion based anteriorly.  A purple relatively soft meningioma was identified at the level of the sylvian fissure, adherent to the dura at this location.  Bipolar electrocautery was then used to disconnect the superficial portion of the tumor from the dura and the dura was reflected anteriorly and tacked up with 4-0 Nurolon stitches.  The microscope was then brought into the field after being draped sterilely.  The remainder of the tumor dissection was done under the microscope using microdissection technique.  Utilizing a combination of bipolar electrocautery, and microdissection, the tumor was dissected from the surrounding frontal and temporal lobes.  There was a good pial plane superficially.  Bipolar electrocautery was used to slowly disconnect the tumor from its dural attachment along the sphenoid wing.  As this was done, the tumor began to deliver itself superficially.  The ultrasonic aspirator was then used to slowly debulk the tumor which allowed further dissection around the  border of the tumor.  Ultimately, I was able to identify the anterior and inferior most  margin of the tumor at the temporal pole.  Cottonoids were then placed in this location.  Similarly, I then turned attention towards the frontal margin of the tumor.  Initially I was able to identify the optic nerve.  Arachnoid knife was used to open the optical carotid cistern which allowed more egress of CSF.  I was then able to further debulk the frontal portion of the tumor with the ultrasonic aspirator.  This then allowed dissection of the tumor away from its dural attachment along the frontal margin of the sphenoid wing.  This portion of the tumor was then resected.  I was then able to identify the supraclinoid portion of the internal carotid artery.  This was then followed more distally into the sylvian fissure and allowed dissection of the tumor away from the supraclinoid internal carotid and the MCA branches.  Ultrasonic aspirator was then used to further debulk the tumor more posteriorly and superiorly.  Once this was done, I was then able to complete the dissection of the tumor medially and posteriorly away from the MCA branches.  The large portions of the tumor that were removed periodically were sent for permanent pathology.  At this point having remove the tumor, the tumor bed was irrigated with copious amount of normal saline irrigation.  There was minimal amount of bleeding from the surrounding brain parenchyma which was easily controlled with morselized Gelfoam with thrombin.  The wound was again irrigated with normal saline and no active bleeding was identified.  I then used the bipolar electrocautery to fully cauterize the temporal, sphenoid, and lateral portion of the frontal dura where the meningioma had its attachment.  I also resected the more superficial dura overlying the sylvian fissure.  This was sent as a separate specimen.  I then placed a DuraGen collagen inlay graft.  The remainder of the dura was reapproximated with 4 Nurolon stitches.  A large sheet of Gelfoam was placed as an  onlay graft.  The bone flap was then replaced and plated with standard titanium plates and screws.  Wound was again irrigated with normal saline.  The temporalis muscle and fascia were reapproximated with interrupted 0 Vicryl stitches.  The galea was closed with interrupted 3-0 Vicryl stitches and the skin was closed with staples.  Patient was then removed from the [place] head holder and sterile dressing with a turban wrap was applied.  At the end of the case all sponge, needle, instrument, and cottonoid counts were correct.  Patient was then extubated and taken to the postanesthesia care unit in stable hemodynamic condition.   Consuella Lose, MD [facility]

## 2021-02-20 NOTE — Transfer of Care (Signed)
Immediate Anesthesia Transfer of Care Note  Patient: Barbara Kim  Procedure(s) Performed: Stereotactic LEFT pterional craniotomy for resection of meningioma (Left ) APPLICATION OF CRANIAL NAVIGATION (Left )  Patient Location: PACU  Anesthesia Type:General  Level of Consciousness: drowsy  Airway & Oxygen Therapy: Patient Spontanous Breathing  Post-op Assessment: Report given to RN and Post -op Vital signs reviewed and stable  Post vital signs: Reviewed and stable  Last Vitals:  Vitals Value Taken Time  BP    Temp    Pulse    Resp    SpO2      Last Pain:  Vitals:   02/20/21 1007  TempSrc:   PainSc: 0-No pain      Patients Stated Pain Goal: 5 (73/08/56 [id number])  Complications: No complications documented.

## 2021-02-20 NOTE — Anesthesia Preprocedure Evaluation (Signed)
Anesthesia Evaluation  Patient identified by MRN, date of birth, ID band Patient awake    Reviewed: Allergy & Precautions, NPO status , Patient's Chart, lab work & pertinent test results  Airway Mallampati: I  TM Distance: >3 FB Neck ROM: Full    Dental  (+) Teeth Intact   Pulmonary neg pulmonary ROS,    Pulmonary exam normal        Cardiovascular hypertension, Pt. on medications  Rhythm:Regular Rate:Normal     Neuro/Psych Left meningioma  negative psych ROS   GI/Hepatic negative GI ROS, Neg liver ROS,   Endo/Other  Hyperthyroidism   Renal/GU negative Renal ROS  negative genitourinary   Musculoskeletal negative musculoskeletal ROS (+)   Abdominal (+)  Abdomen: soft. Bowel sounds: normal.  Peds  Hematology negative hematology ROS (+)   Anesthesia Other Findings   Reproductive/Obstetrics                             Anesthesia Physical Anesthesia Plan  ASA: III  Anesthesia Plan: General   Post-op Pain Management:    Induction: Intravenous  PONV Risk Score and Plan: 3  Airway Management Planned: Mask and Oral ETT  Additional Equipment: Arterial line  Intra-op Plan:   Post-operative Plan: Extubation in OR  Informed Consent: I have reviewed the patients History and Physical, chart, labs and discussed the procedure including the risks, benefits and alternatives for the proposed anesthesia with the patient or authorized representative who has indicated his/her understanding and acceptance.     Dental advisory given  Plan Discussed with: CRNA  Anesthesia Plan Comments: (Lab Results      Component                Value               Date                      WBC                      7.0                 02/17/2021                HGB                      16.3 (H)            02/17/2021                HCT                      49.1 (H)            02/17/2021                MCV                       94.6                02/17/2021                PLT                      200                 02/17/2021  Lab Results      Component                Value               Date                      NA                       140                 02/17/2021                K                        3.9                 02/17/2021                CO2                      26                  02/17/2021                GLUCOSE                  105 (H)             02/17/2021                BUN                      20                  02/17/2021                CREATININE               0.62                02/17/2021                CALCIUM                  9.6                 02/17/2021                GFRNONAA                 >60                 02/17/2021                GFRAA                    >90                 03/16/2014          )        Anesthesia Quick Evaluation

## 2021-02-20 NOTE — Anesthesia Procedure Notes (Signed)
Procedure Name: Intubation Date/Time: 02/20/2021 11:54 AM Performed by: Bryson Corona, CRNA Pre-anesthesia Checklist: Patient identified, Emergency Drugs available, Suction available and Patient being monitored Patient Re-evaluated:Patient Re-evaluated prior to induction Oxygen Delivery Method: Circle System Utilized Preoxygenation: Pre-oxygenation with 100% oxygen Induction Type: IV induction Ventilation: Mask ventilation without difficulty and Oral airway inserted - appropriate to patient size Laryngoscope Size: Sabra Heck and 2 Grade View: Grade I Tube type: Oral Tube size: 7.0 mm Number of attempts: 1 Airway Equipment and Method: Stylet and Oral airway Placement Confirmation: ETT inserted through vocal cords under direct vision,  positive ETCO2 and breath sounds checked- equal and bilateral Secured at: 21 cm Tube secured with: Tape Dental Injury: Teeth and Oropharynx as per pre-operative assessment

## 2021-02-21 ENCOUNTER — Inpatient Hospital Stay (HOSPITAL_COMMUNITY): Payer: Medicare HMO

## 2021-02-21 LAB — BASIC METABOLIC PANEL
Anion gap: 9 (ref 5–15)
BUN: 9 mg/dL (ref 8–23)
CO2: 21 mmol/L — ABNORMAL LOW (ref 22–32)
Calcium: 8.3 mg/dL — ABNORMAL LOW (ref 8.9–10.3)
Chloride: 112 mmol/L — ABNORMAL HIGH (ref 98–111)
Creatinine, Ser: 0.56 mg/dL (ref 0.44–1.00)
GFR, Estimated: >60 mL/min (ref >60)
Glucose, Bld: 150 mg/dL — ABNORMAL HIGH (ref 70–99)
Potassium: 3.8 mmol/L (ref 3.5–5.1)
Sodium: 142 mmol/L (ref 135–145)

## 2021-02-21 LAB — CBC
HCT: 32.9 % — ABNORMAL LOW (ref 36.0–46.0)
Hemoglobin: 11.3 g/dL — ABNORMAL LOW (ref 12.0–15.0)
MCH: 32.1 pg (ref 26.0–34.0)
MCHC: 34.3 g/dL (ref 30.0–36.0)
MCV: 93.5 fL (ref 80.0–100.0)
Platelets: 146 10*3/uL — ABNORMAL LOW (ref 150–400)
RBC: 3.52 MIL/uL — ABNORMAL LOW (ref 3.87–5.11)
RDW: 12.4 % (ref 11.5–15.5)
WBC: 19.5 10*3/uL — ABNORMAL HIGH (ref 4.0–10.5)
nRBC: 0 % (ref 0.0–0.2)

## 2021-02-21 LAB — GLUCOSE, CAPILLARY: Glucose-Capillary: 132 mg/dL — ABNORMAL HIGH (ref 70–99)

## 2021-02-21 IMAGING — MR MR HEAD WO/W CM
12 of 14 series · 40 of 48 positions shown · IV contrast (gadavist)
Comparison: MRI [DATE].

CLINICAL DATA: Postop craniotomy.

EXAM:
MRI HEAD WITHOUT AND WITH CONTRAST
TECHNIQUE: Multiplanar, multiecho pulse sequences of the brain and surrounding
structures were obtained without and with intravenous contrast.
CONTRAST:  4mL GADAVIST GADOBUTROL 1 MMOL/ML IV SOLN

[Series 5: DWI · axial · 3.0mm · 0.88mm/px · z∈[-145,+1]mm · 8 of 100 slices shown (1 of 4)]
[im 1/100]
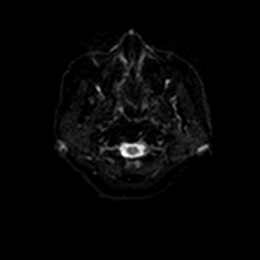
[im 15/100]
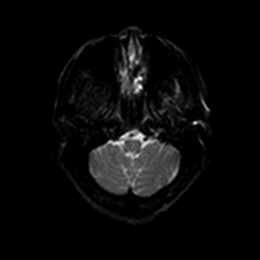
[im 29/100]
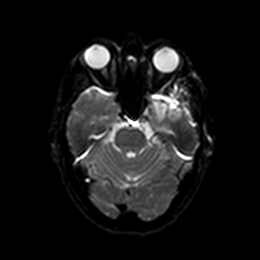
[im 43/100]
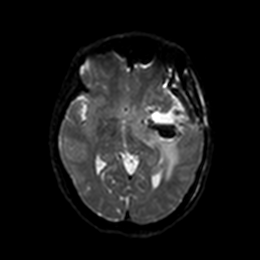
[im 57/100]
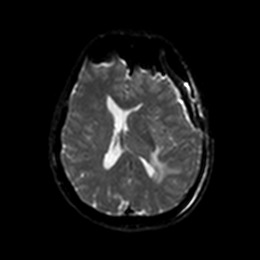
[im 71/100]
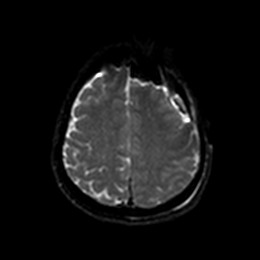
[im 85/100]
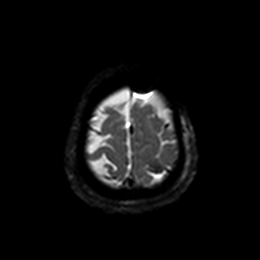
[im 100/100]
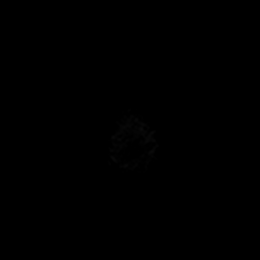

[Series 6: DWI · axial · 3.0mm · 0.88mm/px · z∈[-145,+1]mm · 4 of 49 slices shown (2 of 4)]
[im 1/49]
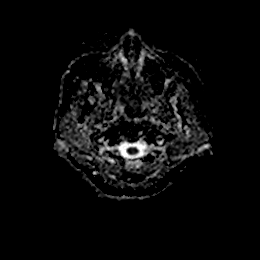
[im 17/49]
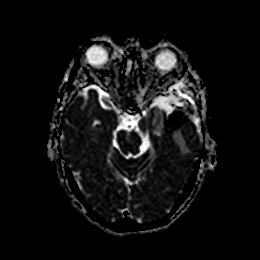
[im 33/49]
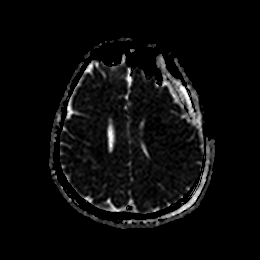
[im 49/49]
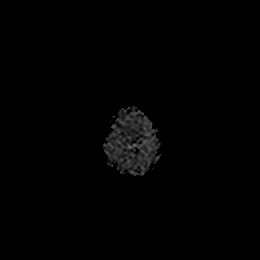

[Series 7: DWI · coronal · 4.0mm · 0.88mm/px · 5 of 70 slices shown (3 of 4)]
[im 1/70]
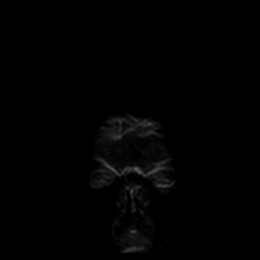
[im 18/70]
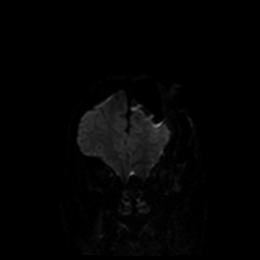
[im 35/70]
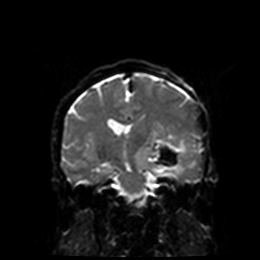
[im 52/70]
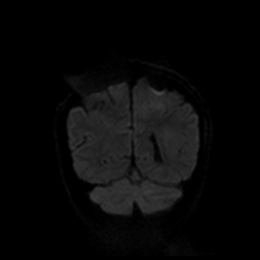
[im 70/70]
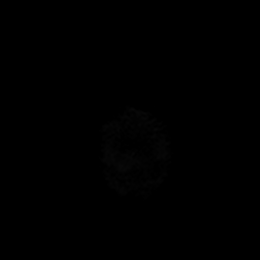

[Series 8: DWI · coronal · 4.0mm · 0.88mm/px · 3 of 35 slices shown (4 of 4)]
[im 1/35]
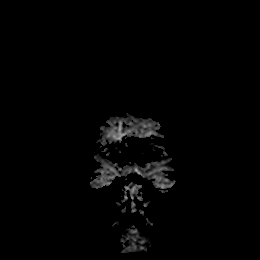
[im 18/35]
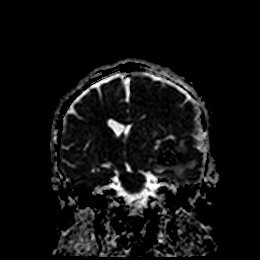
[im 35/35]
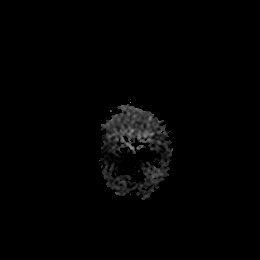

[Series 9: FLAIR · axial · 5.0mm · 0.45mm/px · z∈[-142,+0]mm · 2 of 25 slices shown]
[im 1/25]
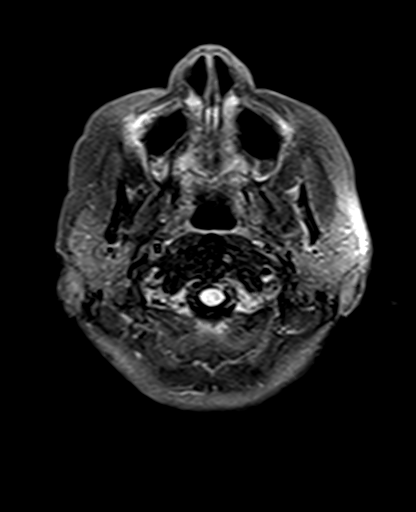
[im 25/25]
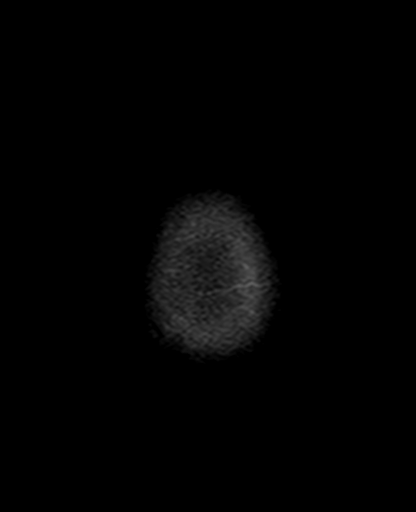

[Series 11: pha_images · axial · 3.0mm · 0.90mm/px · z∈[-147,+5]mm · 4 of 52 slices shown]
[im 1/52]
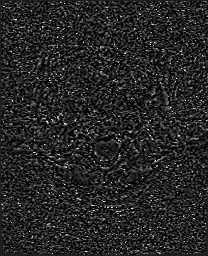
[im 18/52]
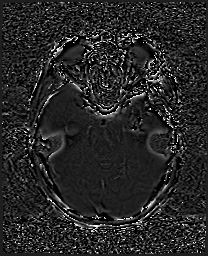
[im 35/52]
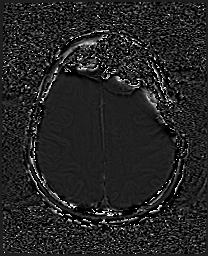
[im 52/52]
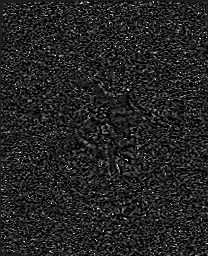

[Series 12: swi_images · axial · 3.0mm · 0.90mm/px · z∈[-147,+5]mm · 4 of 52 slices shown]
[im 1/52]
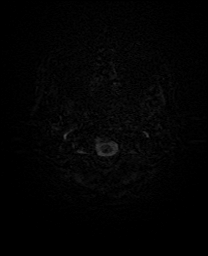
[im 18/52]
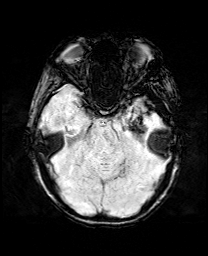
[im 35/52]
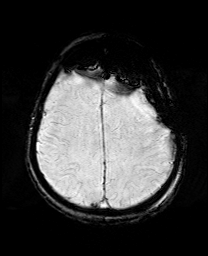
[im 52/52]
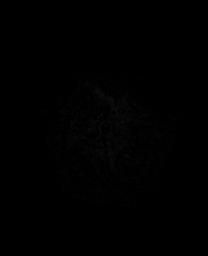

[Series 14: T1 · sagittal · 5.0mm · 0.86mm/px · 2 of 25 slices shown]
[im 1/25]
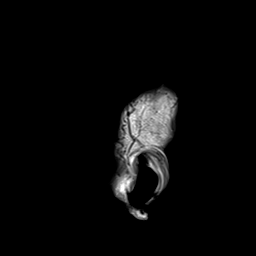
[im 25/25]
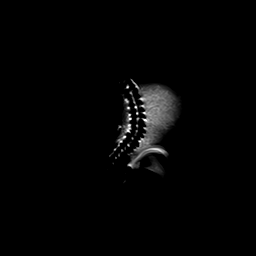

[Series 15: T2 · axial · 5.0mm · 0.72mm/px · z∈[-143,-1]mm · 2 of 25 slices shown]
[im 1/25]
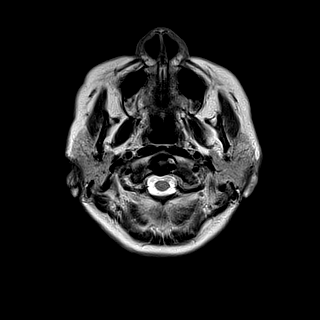
[im 25/25]
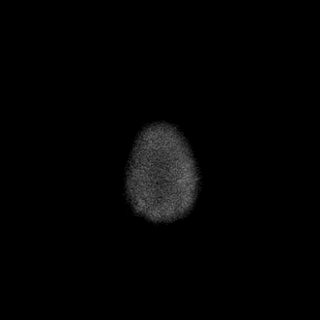

[Series 17: T2 post-contrast · coronal · 5.0mm · 0.72mm/px · 2 of 29 slices shown]
[im 1/29]
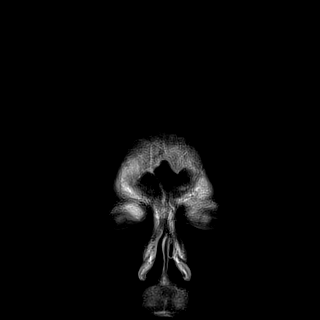
[im 29/29]
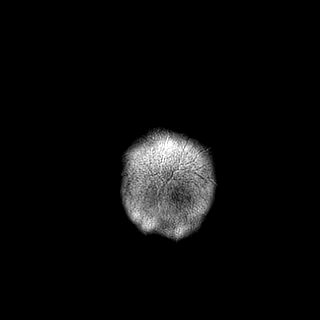

[Series 19: T1 post-contrast · coronal · 5.0mm · 0.34mm/px · 2 of 29 slices shown (1 of 2)]
[im 1/29]
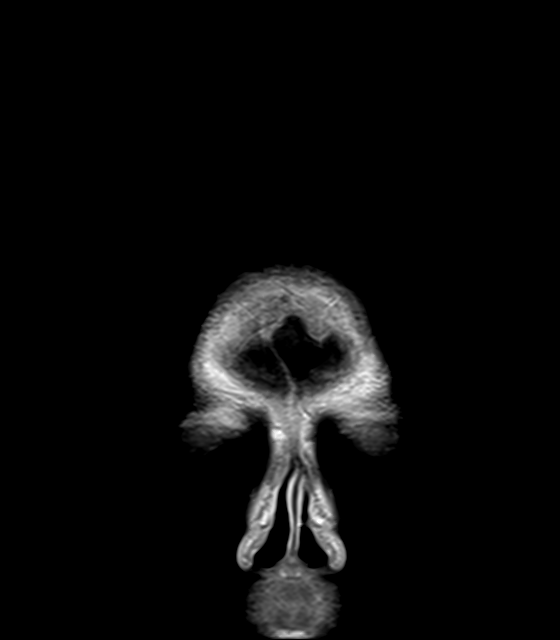
[im 29/29]
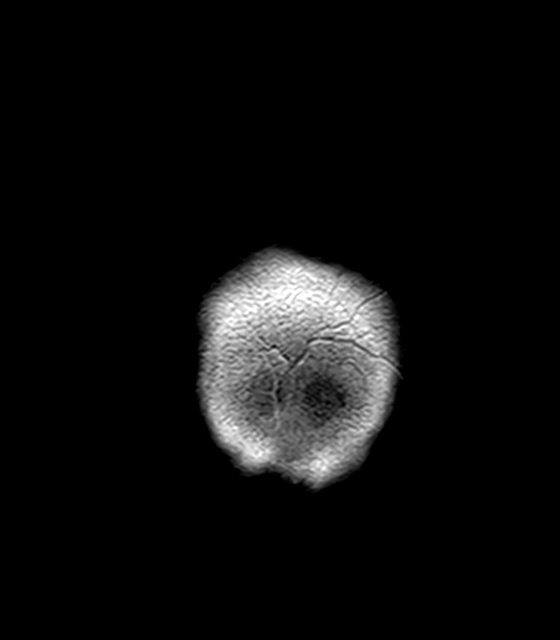

[Series 20: T1 post-contrast · sagittal · 5.0mm · 0.86mm/px · 2 of 25 slices shown (2 of 2)]
[im 1/25]
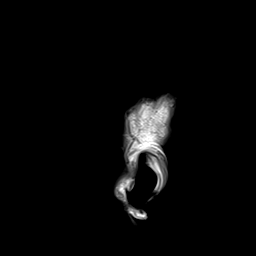
[im 25/25]
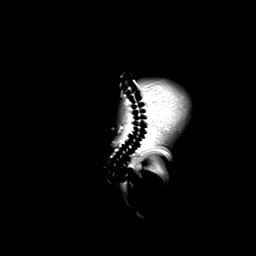

[40 of 48 positions shown; findings below may reference images not displayed]

FINDINGS: Brain: Interval left frontal craniotomy for resection of a
meningioma. At the resection site there is expected peripheral
linear enhancement and blood products. There is hematocrit level
layering within the posterior aspect of the resection cavity. No
nodular/masslike enhancement to suggest residual tumor. Along the
posterolateral aspect of the resection cavity there is a focal area
of rounded nonenhancing restricted diffusion (series 5, image 67),
compatible with acute infarct/devitalized tissue. Subjacent to the
left craniotomy site there is Gel-Foam and graft material and small
volume extra-axial fluid/blood products. There is moderate volume of
pneumocephalus overlying the left greater than right frontal
convexities. These findings result in mass effect on the left
greater than right frontal lobes with partial effacement of the left
lateral ventricle and approximately 7 mm of rightward midline shift
near the foramen of [place]. No evidence of hydrocephalus. T2/FLAIR
hyperintense edema surrounding the resection cavity posteriorly
appears similar to prior.

Vascular: Major arterial flow voids are maintained at the skull
base.

Skull and upper cervical spine: Normal marrow signal.

Sinuses/Orbits: Negative.

Other: No mastoid effusions.
IMPRESSION: 1. Postsurgical changes of putative left sphenoid wing meningioma
resection. No masslike enhancement of the resection cavity to
suggest residual tumor. Infarcted tissue along the posterolateral
aspect of the resection cavity.
2. Subjacent to the craniotomy site there is Gel-Foam and graft
material with small volume of extra-axial fluid/hemorrhage and
moderate left greater than right bifrontal pneumocephalus. Together,
these findings result in mass effect on the left greater than right
frontal lobes with approximately 7 mm of rightward midline shift and
partial effacement of the left lateral ventricle without
hydrocephalus. Recommend continued attention on follow-up.

## 2021-02-21 MED ORDER — GADOBUTROL 1 MMOL/ML IV SOLN
4.0000 mL | Freq: Once | INTRAVENOUS | Status: AC | PRN
  Administered 2021-02-21: 4 mL via INTRAVENOUS

## 2021-02-21 NOTE — Evaluation (Signed)
Occupational Therapy Evaluation Patient Details Name: Barbara Kim MRN: [id number] DOB: 1956/04/06 Today's Date: 02/21/2021    History of Present Illness Pt is a 65 y.o. female who presented 5/27 s/p elective surgery for L frontotemporal crani with resection of meningioma. Pt with a large L sphenoid/sylvia meningioma during work up for episodic right hand tremor & worsening confusion and inattention over the last 6-12 months. PMH: hyperthyroidism, HTN.   Clinical Impression   Pt admitted with above. She demonstrates the below listed deficits and will benefit from continued OT to maximize safety and independence with BADLs.   Pt presents to OT with lethargy, communication deficits, pain, mild Rt sided weakness, impaired balance.  She currently requires mod A, overall for ADLs and min A +2 for functional transfers.  She is limited by pain this date and communication deficits impede her function.  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  Anticipate she will be able to progress to home with OP OT, and OP SLP, however, if progress is slower than anticipated, she may need CIR.       Follow Up Recommendations  Outpatient OT;Supervision/Assistance - 24 hour    Equipment Recommendations  Tub/shower seat    Recommendations for Other Services Speech consult     Precautions / Restrictions Precautions Precautions: Fall Precaution Comments: Pt is impulsive and demonstrates communication deficits      Mobility Bed Mobility Overal bed mobility: Needs Assistance Bed Mobility: Supine to Sit     Supine to sit: Min assist     General bed mobility comments: assist to fully scoot toward EOB    Transfers Overall transfer level: Needs assistance Equipment used: 2 person hand held assist Transfers: Sit to/from [employer] Transfers Sit to Stand: Min assist;+2 physical assistance;+2 safety/equipment Stand pivot transfers:  Min assist;+2 physical assistance;+2 safety/equipment       General transfer comment: pt requires assist to steady and pt very impulsive    Balance Overall balance assessment: Needs assistance Sitting-balance support: Single extremity supported Sitting balance-Leahy Scale: Fair Sitting balance - Comments: maintains static sitting without UE support   Standing balance support: Single extremity supported;During functional activity Standing balance-Leahy Scale: Poor Standing balance comment: reliant on UE support                           ADL either performed or assessed with clinical judgement   ADL Overall ADL's : Needs assistance/impaired Eating/Feeding: Set up;Sitting   Grooming: Wash/dry hands;Wash/dry face;Oral care;Minimal assistance;Standing Grooming Details (indicate cue type and reason): pt required cues for sequencing.  Impulsivity noted. Upper Body Bathing: Moderate assistance;Sitting   Lower Body Bathing: Moderate assistance;Sit to/from stand   Upper Body Dressing : Moderate assistance;Sitting   Lower Body Dressing: Maximal assistance;Sit to/from stand   Toilet Transfer: Minimal assistance;+2 for safety/equipment;Ambulation;Comfort height toilet;Grab bars Toilet Transfer Details (indicate cue type and reason): pt required multi modal cues to perform task Toileting- Clothing Manipulation and Hygiene: Sit to/from stand;Minimal assistance       Functional mobility during ADLs: Minimal assistance;+2 for physical assistance;+2 for safety/equipment General ADL Comments: Pt impulsive.  Limited by pain     Vision Baseline Vision/History: Wears glasses Wears Glasses: Reading only Additional Comments: will need further assessment     Perception Perception Comments: will benefit from further assessment as pain improves   Praxis Praxis Praxis-Other Comments: will benefit from further testing  Pertinent Vitals/Pain Pain Assessment: Faces Faces Pain  Scale: Hurts even more Pain Location: head Pain Descriptors / Indicators: Grimacing;Guarding;Discomfort;Operative site guarding Pain Intervention(s): Monitored during session;Limited activity within patient's tolerance;Repositioned;Patient requesting pain meds-RN notified     Hand Dominance Right   Extremity/Trunk Assessment Upper Extremity Assessment Upper Extremity Assessment: RUE deficits/detail RUE Deficits / Details: 4/5-4+/5 RUE Sensation: WNL RUE Coordination: decreased fine motor (? very mild deficit)   Lower Extremity Assessment Lower Extremity Assessment: Defer to PT evaluation   Cervical / Trunk Assessment Cervical / Trunk Assessment: Normal   Communication Communication Communication: Receptive difficulties;Expressive difficulties   Cognition Arousal/Alertness: Awake/alert;Lethargic Behavior During Therapy: Impulsive;WFL for tasks assessed/performed Overall Cognitive Status: Difficult to assess                                 General Comments: Pt initially asleep and lethargic, but this improved with activity.  She  follows one step commands in context of situation and at times with  multi modal cues.  cognition difficult to accurately assess due to language deficits   General Comments  daughter present during session    Exercises     Shoulder Instructions      Home Living Family/patient expects to be discharged to:: Private residence Living Arrangements: Spouse/significant other;Children Available Help at Discharge: Family;Available 24 hours/day Type of Home: House Home Access: Stairs to enter [employer] of Steps: 3 Entrance Stairs-Rails: None Home Layout: Two level;Able to live on main level with bedroom/bathroom Alternate Level Stairs-Number of Steps: full flight   Bathroom Shower/Tub: Tub/shower unit;Curtain   Biochemist, clinical: Standard     Home Equipment: None   Additional Comments: pt lives with her spouse, and  daughter, who is an Therapist, sports will be able to provide 24 hour assist at discharge      Prior Functioning/Environment Level of Independence: Independent        Comments: 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  She stopped working due to [employer] of tumor.  She was managing her practice independently, was very active, and enjoyed running in the mornings with friends.  Her daughters noticed memory difficulties in 2/22 initially        OT Problem List: Decreased activity tolerance;Impaired balance (sitting and/or standing);Impaired vision/perception;Decreased coordination;Decreased cognition;Decreased safety awareness;Impaired UE functional use;Pain      OT Treatment/Interventions: Self-care/ADL training;DME and/or AE instruction;Therapeutic exercise;Cognitive remediation/compensation;Visual/perceptual remediation/compensation;Patient/family education;Balance training;Therapeutic activities    OT Goals(Current goals can be found in the care plan section) Acute Rehab OT Goals Patient Stated Goal: for pain and dizziness to improve OT Goal Formulation: With patient/family Time For Goal Achievement: 03/05/21 ADL Goals Pt Will Perform Eating: Independently;sitting Pt Will Perform Grooming: with min guard assist;standing Pt Will Perform Upper Body Bathing: with set-up;sitting Pt Will Perform Lower Body Bathing: with min guard assist;sit to/from stand Pt Will Perform Upper Body Dressing: with set-up;with supervision;sitting Pt Will Perform Lower Body Dressing: with min guard assist;sit to/from stand Pt Will Transfer to Toilet: with min guard assist;ambulating;regular height toilet;grab bars Pt Will Perform Toileting - Clothing Manipulation and hygiene: with min guard assist;sit to/from stand  OT Frequency: Min 2X/week   Barriers to D/C:            Co-evaluation PT/OT/SLP Co-Evaluation/Treatment: Yes Reason for Co-Treatment: For patient/therapist safety;To address  functional/ADL transfers   OT goals addressed during session: ADL's and self-care      AM-PAC OT "6 Clicks"  Daily Activity     Outcome Measure Help from another person eating meals?: A Little Help from another person taking care of personal grooming?: A Little Help from another person toileting, which includes using toliet, bedpan, or urinal?: A Lot Help from another person bathing (including washing, rinsing, drying)?: A Lot Help from another person to put on and taking off regular upper body clothing?: A Lot Help from another person to put on and taking off regular lower body clothing?: A Lot 6 Click Score: 14   End of Session Nurse Communication: Mobility status;Patient requests pain meds  Activity Tolerance: Patient limited by pain Patient left: in bed;with call bell/phone within reach;with bed alarm set;with family/visitor present  OT Visit Diagnosis: Unsteadiness on feet (R26.81);Cognitive communication deficit (R41.841) Symptoms and signs involving cognitive functions:  (brain tumor)                Time: 7199-4129 OT Time Calculation (min): 29 min Charges:  OT General Charges $OT Visit: 1 Visit OT Evaluation $OT Eval Moderate Complexity: 1 Mod  Nilsa Nutting., OTR/L Acute Rehabilitation Services Pager [phone] Office [phone]   Lucille Passy M 02/21/2021, 1:23 PM

## 2021-02-21 NOTE — Anesthesia Postprocedure Evaluation (Signed)
Anesthesia Post Note  Patient: Barbara Kim  Procedure(s) Performed: Stereotactic LEFT pterional craniotomy for resection of meningioma (Left ) APPLICATION OF CRANIAL NAVIGATION (Left )     Patient location during evaluation: PACU Anesthesia Type: General Level of consciousness: awake and alert Pain management: pain level controlled Vital Signs Assessment: post-procedure vital signs reviewed and stable Respiratory status: spontaneous breathing, nonlabored ventilation, respiratory function stable and patient connected to nasal cannula oxygen Cardiovascular status: blood pressure returned to baseline and stable Postop Assessment: no apparent nausea or vomiting Anesthetic complications: no   No complications documented.  Last Vitals:  Vitals:   02/21/21 0500 02/21/21 0600  BP: 133/62 132/66  Pulse: 75 91  Resp: (!) 21 16  Temp:    SpO2: 94% 96%    Last Pain:  Vitals:   02/21/21 0600  TempSrc:   PainSc: 0-No pain                 Belenda Cruise P Sherlynn Tourville

## 2021-02-21 NOTE — Evaluation (Signed)
Physical Therapy Evaluation Patient Details Name: Barbara Kim MRN: [id number] DOB: October 31, 1955 Today's Date: 02/21/2021   History of Present Illness  Pt is a 65 y.o. female who presented 5/27 s/p elective surgery for L frontotemporal crani with resection of meningioma. Pt with a large L sphenoid/sylvia meningioma during work up for episodic right hand tremor & worsening confusion and inattention over the last 6-12 months. PMH: hyperthyroidism, HTN.    Clinical Impression  Pt presents with condition above and deficits mentioned below, see PT Problem List.  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 She lives in a 2-level house, in which she can live on the main level, with 3 STE without rails. Currently, pt displays lethargy, communication deficits, pain, mild Rt sided weakness, decreased activity tolerance, cognitive deficits, and impaired balance placing her at high risk for falls. She currently requires minA for bed mobility, min A +2 for functional transfers, and minA + 2 for bedroom distance ambulation with bil UE support. She is limited by pain this date and communication deficits impede her function. Anticipate she will be able to progress to home with Outpatient PT, however, if progress is slower than anticipated, she may need CIR. Will continue to follow acutely.    Follow Up Recommendations Outpatient PT;Supervision/Assistance - 24 hour    Equipment Recommendations  Rolling walker with 5" wheels (may change with progression)    Recommendations for Other Services Speech consult     Precautions / Restrictions Precautions Precautions: Fall Precaution Comments: Pt is impulsive and demonstrates communication deficits Restrictions Weight Bearing Restrictions: No      Mobility  Bed Mobility Overal bed mobility: Needs Assistance Bed Mobility: Supine to Sit;Sit to Supine     Supine to sit: Min assist Sit to  supine: Min assist   General bed mobility comments: assist to fully scoot toward EOB or back into bed.    Transfers Overall transfer level: Needs assistance Equipment used: 2 person hand held assist Transfers: Sit to/from [facility] Sit to Stand: Min assist;+2 physical assistance;+2 safety/equipment Stand pivot transfers: Min assist;+2 physical assistance;+2 safety/equipment       General transfer comment: pt requires assist to steady and pt very impulsive  Ambulation/Gait Ambulation/Gait assistance: Min assist;+2 physical assistance;+2 safety/equipment Gait Distance (Feet): 15 Feet (x2 bouts of ~15 ft each bout) Assistive device: 2 person hand held assist Gait Pattern/deviations: Step-through pattern;Decreased step length - right;Decreased step length - left;Decreased stride length;Narrow base of support Gait velocity: reduced Gait velocity interpretation: <1.8 ft/sec, indicate of risk for recurrent falls General Gait Details: Pt ambulating with narrow BOS and bil HHA minAx2 for steadying and directing pt. Intermittently pt would rise up on her toes to ambulate.  Stairs            Wheelchair Mobility    Modified Rankin (Stroke Patients Only) Modified Rankin (Stroke Patients Only) Pre-Morbid Rankin Score: No symptoms Modified Rankin: Moderately severe disability     Balance Overall balance assessment: Needs assistance Sitting-balance support: Single extremity supported Sitting balance-Leahy Scale: Fair Sitting balance - Comments: maintains static sitting without UE support   Standing balance support: Single extremity supported;During functional activity Standing balance-Leahy Scale: Poor Standing balance comment: reliant on UE support                             Pertinent Vitals/Pain Pain Assessment: Faces Faces Pain Scale: Hurts  even more Pain Location: head Pain Descriptors / Indicators: Grimacing;Guarding;Discomfort;Operative  site guarding Pain Intervention(s): Limited activity within patient's tolerance;Monitored during session;Repositioned;Patient requesting pain meds-RN notified    Home Living Family/patient expects to be discharged to:: Private residence Living Arrangements: Spouse/significant other;Children Available Help at Discharge: Family;Available 24 hours/day Type of Home: House Home Access: Stairs to enter Entrance Stairs-Rails: None Entrance Stairs-Number of Steps: 3 Home Layout: Two level;Able to live on main level with bedroom/bathroom Home Equipment: None Additional Comments: pt lives with her spouse, and daughter, who is an Therapist, sports will be able to provide 24 hour assist at discharge    Prior Function Level of Independence: Independent         Comments: 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  She stopped working due to [employer] of tumor.  She was managing her practice independently, was very active, and enjoyed running in the mornings with friends.  Her daughters noticed memory difficulties in 2/22 initially     Hand Dominance   Dominant Hand: Right    Extremity/Trunk Assessment   Upper Extremity Assessment Upper Extremity Assessment: Defer to OT evaluation RUE Deficits / Details: 4/5-4+/5 RUE Sensation: WNL RUE Coordination: decreased fine motor (? very mild deficit)    Lower Extremity Assessment Lower Extremity Assessment: RLE deficits/detail RLE Deficits / Details: MMT scores of grossly 4 to 4+ compared to L being 4+ to 5 RLE Sensation:  (unclear whether there may be proprioception deficits) RLE Coordination: decreased gross motor    Cervical / Trunk Assessment Cervical / Trunk Assessment: Normal  Communication   Communication: Receptive difficulties;Expressive difficulties  Cognition Arousal/Alertness: Awake/alert;Lethargic Behavior During Therapy: Impulsive;WFL for tasks assessed/performed Overall Cognitive Status: Difficult to assess                                  General Comments: Pt initially asleep and lethargic, but this improved with activity.  She  follows one step commands in context of situation and at times with  multi modal cues.  cognition difficult to accurately assess due to language deficits. Pt with poor safety and spatial awareness, impulsive.      General Comments General comments (skin integrity, edema, etc.): daughter present during session    Exercises     Assessment/Plan    PT Assessment Patient needs continued PT services  PT Problem List Decreased strength;Decreased activity tolerance;Decreased balance;Decreased coordination;Decreased mobility;Decreased cognition;Decreased knowledge of use of DME;Decreased safety awareness       PT Treatment Interventions DME instruction;Gait training;Stair training;Functional mobility training;Therapeutic activities;Therapeutic exercise;Balance training;Neuromuscular re-education;Cognitive remediation;Patient/family education    PT Goals (Current goals can be found in the Care Plan section)  Acute Rehab PT Goals Patient Stated Goal: for pain and dizziness to improve PT Goal Formulation: With patient/family Time For Goal Achievement: 03/07/21 Potential to Achieve Goals: Good    Frequency Min 3X/week   Barriers to discharge        Co-evaluation PT/OT/SLP Co-Evaluation/Treatment: Yes Reason for Co-Treatment: Necessary to address cognition/behavior during functional activity;For patient/therapist safety;To address functional/ADL transfers PT goals addressed during session: Mobility/safety with mobility;Balance OT goals addressed during session: ADL's and self-care       AM-PAC PT "6 Clicks" Mobility  Outcome Measure Help needed turning from your back to your side while in a flat bed without using bedrails?: A Little Help needed moving from lying on your back to sitting on the side of a flat bed  without using bedrails?: A Little Help needed moving to  and from a bed to a chair (including a wheelchair)?: A Lot Help needed standing up from a chair using your arms (e.g., wheelchair or bedside chair)?: A Lot Help needed to walk in hospital room?: A Lot Help needed climbing 3-5 steps with a railing? : A Lot 6 Click Score: 14    End of Session Equipment Utilized During Treatment: Gait belt Activity Tolerance: Patient tolerated treatment well Patient left: in bed;with call bell/phone within reach;with bed alarm set;with SCD's reapplied;with family/visitor present Nurse Communication: Mobility status;Other (comment) (SCD not turning on) PT Visit Diagnosis: Unsteadiness on feet (R26.81);Other abnormalities of gait and mobility (R26.89);Muscle weakness (generalized) (M62.81);Difficulty in walking, not elsewhere classified (R26.2);Apraxia (R48.2);Other symptoms and signs involving the nervous system (X93.716)    Time: 9678-9381 PT Time Calculation (min) (ACUTE ONLY): 29 min   Charges:   PT Evaluation $PT Eval Moderate Complexity: 1 Mod          Moishe Spice, PT, DPT Acute Rehabilitation Services  Pager: [phone] Office: [phone]   Orvan Falconer 02/21/2021, 1:40 PM

## 2021-02-21 NOTE — Progress Notes (Signed)
Subjective: No acute events overnight. BPs have been more stable.   Objective: Vital signs in last 24 hours: Temp:  [97.5 F (36.4 C)-100.8 F (38.2 C)] 98.5 F (36.9 C) (05/28 0800) Pulse Rate:  [63-94] 79 (05/28 0900) Resp:  [9-21] 13 (05/28 0900) BP: (73-173)/(48-88) 124/64 (05/28 0900) SpO2:  [94 %-100 %] 96 % (05/28 0900) Arterial Line BP: (87-165)/(59-82) 157/59 (05/27 2300) Weight:  [44.9 kg] 44.9 kg (05/27 0928)  Intake/Output from previous day: 05/27 0701 - 05/28 0700 In: 2394.7 [I.V.:2194.7; IV Piggyback:200] Out: 2025 [Urine:1825; Blood:200] Intake/Output this shift: Total I/O In: 207.1 [I.V.:207.1] Out: -   Physical Exam: Patient is awake, A/O X 4, and following commands. PERRLA. Doing well. MAEW   Lab Results: Recent Labs    02/21/21 0150  WBC 19.5*  HGB 11.3*  HCT 32.9*  PLT 146*   BMET Recent Labs    02/21/21 0150  NA 142  K 3.8  CL 112*  CO2 21*  GLUCOSE 150*  BUN 9  CREATININE 0.56  CALCIUM 8.3*    Studies/Results: No results found.  Assessment/Plan: 65 y.o. female who is post op day 1 s/p left frontotemporal craniotomy for resection of meningioma. Patient is doing well. MRI pending. Continue supportive care. No new neurosurgical recommendations.   LOS: 1 day     Marvis Moeller, DNP, NP-C 02/21/2021, 9:07 AM

## 2021-02-22 MED ORDER — PANTOPRAZOLE SODIUM 40 MG PO TBEC
40.0000 mg | DELAYED_RELEASE_TABLET | Freq: Every day | ORAL | Status: DC
  Administered 2021-02-22: 40 mg via ORAL
  Filled 2021-02-22: qty 1

## 2021-02-22 MED ORDER — HEPARIN SODIUM (PORCINE) 5000 UNIT/ML IJ SOLN
5000.0000 [IU] | Freq: Three times a day (TID) | INTRAMUSCULAR | Status: DC
  Administered 2021-02-22 – 2021-02-23 (×4): 5000 [IU] via SUBCUTANEOUS
  Filled 2021-02-22 (×3): qty 1

## 2021-02-22 NOTE — Progress Notes (Signed)
Neurosurgery Service Progress Note  Subjective: No acute events overnight, some mild to moderate WFD o/w no issues   Objective: Vitals:   02/22/21 0400 02/22/21 0500 02/22/21 0600 02/22/21 0800  BP: 137/67 (!) 143/68 (!) 141/68   Pulse: 70 79 69   Resp: 20 20 19    Temp: 98.6 F (37 C)   99 F (37.2 C)  TempSrc: Oral   Oral  SpO2: 93% 93% 93%   Weight:      Height:        Physical Exam: AOx3, PERRL, EOMI, speech fluent with occasional WFD and rare paraphasias, FS, TM, Strength 5/5 x4, SILTx4, no drift, no tremor  Assessment & Plan: 65 y.o. woman s/p L rsxn lateral sphenoid wing meningioma, MRI w/ GTR, recovering well.  -transfer to floor -cont OT -if continues to do well, potentially home tomorrow -SCDs/TEDs, Jackey Loge  02/22/21 9:59 AM

## 2021-02-23 LAB — RENAL FUNCTION PANEL
Albumin: 2.4 g/dL — ABNORMAL LOW (ref 3.5–5.0)
Anion gap: 8 (ref 5–15)
BUN: 6 mg/dL — ABNORMAL LOW (ref 8–23)
CO2: 27 mmol/L (ref 22–32)
Calcium: 8.1 mg/dL — ABNORMAL LOW (ref 8.9–10.3)
Chloride: 105 mmol/L (ref 98–111)
Creatinine, Ser: 0.47 mg/dL (ref 0.44–1.00)
GFR, Estimated: >60 mL/min (ref >60)
Glucose, Bld: 124 mg/dL — ABNORMAL HIGH (ref 70–99)
Phosphorus: 1.8 mg/dL — ABNORMAL LOW (ref 2.5–4.6)
Potassium: 3.3 mmol/L — ABNORMAL LOW (ref 3.5–5.1)
Sodium: 140 mmol/L (ref 135–145)

## 2021-02-23 LAB — CBC
HCT: 26.7 % — ABNORMAL LOW (ref 36.0–46.0)
Hemoglobin: 9.2 g/dL — ABNORMAL LOW (ref 12.0–15.0)
MCH: 32.3 pg (ref 26.0–34.0)
MCHC: 34.5 g/dL (ref 30.0–36.0)
MCV: 93.7 fL (ref 80.0–100.0)
Platelets: DECREASED 10*3/uL (ref 150–400)
RBC: 2.85 MIL/uL — ABNORMAL LOW (ref 3.87–5.11)
RDW: 12.7 % (ref 11.5–15.5)
WBC: 10.7 10*3/uL — ABNORMAL HIGH (ref 4.0–10.5)
nRBC: 0 % (ref 0.0–0.2)

## 2021-02-23 MED ORDER — K PHOS MONO-SOD PHOS DI & MONO 155-852-130 MG PO TABS
500.0000 mg | ORAL_TABLET | Freq: Three times a day (TID) | ORAL | Status: DC
  Administered 2021-02-23 (×2): 500 mg via ORAL
  Filled 2021-02-23 (×3): qty 2

## 2021-02-23 MED ORDER — POTASSIUM CHLORIDE CRYS ER 20 MEQ PO TBCR
40.0000 meq | EXTENDED_RELEASE_TABLET | Freq: Once | ORAL | Status: AC
  Administered 2021-02-23: 40 meq via ORAL
  Filled 2021-02-23: qty 2

## 2021-02-23 MED ORDER — HYDROCODONE-ACETAMINOPHEN 5-325 MG PO TABS: 1.0000 | ORAL_TABLET | ORAL | 0 refills | Status: DC | PRN

## 2021-02-23 NOTE — Progress Notes (Signed)
Pharmacy Electrolyte Replacement  Recent Labs:  Recent Labs    02/23/21 0206  K 3.3*  PHOS 1.8*  CREATININE 0.47    Plan:  KCL 77mEq PO x 1 K Phos Neutral 500mg  PO TID x 3 doses Check labs in AM  Altheia Shafran D. Mina Marble, PharmD, BCPS, [place] 02/23/2021, 11:05 AM

## 2021-02-23 NOTE — Progress Notes (Signed)
Occupational Therapy Treatment Patient Details Name: Barbara Kim MRN: [id number] DOB: [birth year] Today's Date: 02/23/2021    History of present illness Pt is a 65 y.o. female who presented 5/27 s/p elective surgery for L frontotemporal crani with resection of meningioma. Pt with a large L sphenoid/sylvia meningioma during work up for episodic right hand tremor & worsening confusion and inattention over the last 6-12 months. PMH: hyperthyroidism, HTN.   OT comments  Pt is making steady progress.  She was able to perform ADLs with min guard - min A today.  She is impulsive and frequently self distracts causing her to loose her balance and forget instruction.   Recommend pt sit to shower.  Pt and family instructed on options for shower seats.  Recommend she have 24 hour supervision at discharge which family reports they will provide.    Follow Up Recommendations  Outpatient OT;Supervision/Assistance - 24 hour    Equipment Recommendations  3 in 1 bedside commode    Recommendations for Other Services      Precautions / Restrictions Precautions Precautions: Fall Precaution Comments: Pt is impulsive       Mobility Bed Mobility Overal bed mobility: Needs Assistance Bed Mobility: Supine to Sit;Sit to Supine     Supine to sit: Supervision Sit to supine: Supervision        Transfers Overall transfer level: Needs assistance Equipment used: 1 person hand held assist Transfers: Sit to/from Stand;Stand Pivot Transfers Sit to Stand: Min guard Stand pivot transfers: Min assist;Min guard            Balance Overall balance assessment: Needs assistance Sitting-balance support: Single extremity supported Sitting balance-Leahy Scale: Good     Standing balance support: Single extremity supported;During functional activity Standing balance-Leahy Scale: Fair Standing balance comment: pt able to maintain static standing with min guard asisst                            ADL either performed or assessed with clinical judgement   ADL Overall ADL's : Needs assistance/impaired     Grooming: Wash/dry hands;Wash/dry face;Oral care;Min guard;Standing                   Toilet Transfer: Min guard;Ambulation;Comfort height toilet;RW   Toileting- Water quality scientist and Hygiene: Min guard;Sit to/from stand       Functional mobility during ADLs: Min guard       Vision   Vision Assessment?: Yes Eye Alignment: Within Functional Limits Ocular Range of Motion: Within Functional Limits Alignment/Gaze Preference: Within Defined Limits Tracking/Visual Pursuits: Able to track stimulus in all quads without difficulty Saccades: Decreased speed of saccadic movement Visual Fields: No apparent deficits Additional Comments: Pt with decreased speed of dynamic saccades - slower on the Rt than the [employer]     Praxis      Cognition Arousal/Alertness: Awake/alert Behavior During Therapy: Impulsive Overall Cognitive Status: Impaired/Different from baseline Area of Impairment: Attention;Memory;Following commands;Safety/judgement;Awareness;Problem solving                   Current Attention Level: Selective Memory: Decreased recall of precautions Following Commands: Follows one step commands consistently;Follows multi-step commands inconsistently Safety/Judgement: Decreased awareness of safety;Decreased awareness of deficits Awareness: Emergent Problem Solving: Slow processing;Requires verbal cues General Comments: Pt follows simple commands consistently, but requires cues for multi step commands as she self distracts and forgets what she was going to do.  She is able to tell OT  specifically what she did with PT and SLP and able to describe details of their session, but couldn't recall that she had entered the bathroom to brush her teeth once she became distracted by the need to urinate.        Exercises     Shoulder Instructions        General Comments Spoke with pt and daughters re: current deficits.  Reinforced to pt the need for direct supervision/assist when she is up ambulating due to risk of falls.  Also discussed appropriate activity level and the need to allow herself to rest and take naps to allow her to heal.  Also discussed options for shower seats with pt and daughters    Pertinent Vitals/ Pain       Pain Assessment: No/denies pain  Home Living                                          Prior Functioning/Environment              Frequency  Min 2X/week        Progress Toward Goals  OT Goals(current goals can now be found in the care plan section)  Progress towards OT goals: Progressing toward goals     Plan Discharge plan remains appropriate;Equipment recommendations need to be updated    Co-evaluation                 AM-PAC OT "6 Clicks" Daily Activity     Outcome Measure   Help from another person eating meals?: A Little Help from another person taking care of personal grooming?: A Little Help from another person toileting, which includes using toliet, bedpan, or urinal?: A Little Help from another person bathing (including washing, rinsing, drying)?: A Little Help from another person to put on and taking off regular upper body clothing?: A Little Help from another person to put on and taking off regular lower body clothing?: A Little 6 Click Score: 18    End of Session    OT Visit Diagnosis: Unsteadiness on feet (R26.81);Cognitive communication deficit (R41.841)   Activity Tolerance Patient tolerated treatment well   Patient Left in bed;with call bell/phone within reach;with bed alarm set;with family/visitor present   Nurse Communication Mobility status        Time: 1128-1202 OT Time Calculation (min): 34 min  Charges: OT General Charges $OT Visit: 1 Visit OT Treatments $Self Care/Home Management : 23-37 mins  Nilsa Nutting OTR/L Acute  Rehabilitation Services Pager [phone] Office [phone]    Lucille Passy M 02/23/2021, 3:43 PM

## 2021-02-23 NOTE — Progress Notes (Signed)
Neurosurgery Service Progress Note  Subjective: No acute events overnight, some mild to moderate WFD o/w no issues   Objective: Vitals:   02/23/21 0805 02/23/21 1200 02/23/21 1213 02/23/21 1600  BP:   129/64 120/65  Pulse: 84  72 76  Resp: 16  16 17   Temp:  98.8 F (37.1 C)  98.8 F (37.1 C)  TempSrc:    Oral  SpO2: 98%  99% 95%  Weight:      Height:        Physical Exam: AOx3, PERRL, EOMI, speech fluent with occasional WFD and rare paraphasias, FS, TM, Strength 5/5 x4, SILTx4, no drift, no tremor  Assessment & Plan: 65 y.o. woman s/p L rsxn lateral sphenoid wing meningioma, MRI w/ GTR, recovering well.  -discharge home today  Judith Part  02/23/21 6:44 PM

## 2021-02-23 NOTE — Progress Notes (Signed)
Physical Therapy Treatment Patient Details Name: Barbara Kim MRN: [id number] DOB: [birth year] Today's Date: 02/23/2021    History of Present Illness Pt is a 65 y.o. female who presented 5/27 s/p elective surgery for L frontotemporal crani with resection of meningioma. Pt with a large L sphenoid/sylvia meningioma during work up for episodic right hand tremor & worsening confusion and inattention over the last 6-12 months. PMH: hyperthyroidism, HTN.    PT Comments    The pt was eager to participate with therapies today, demos good improvement in mobility and ability to follow commands from last session. The pt was able to demo good memory of information given through session, and followed instructions/cues to improve safety with mobility. The pt continues to demo poor safety awareness when not given cues for safety, and requires even greater assistance for safety when given a cognitive task to complete while mobilizing. The pt was able to complete hallway ambulation with HHA of 1, but had multiple minor LOB requiring minA to recover, and demos increased sway and frequency of staggering/lateral drifting and scissoring steps when given cognitive task. The pt also completed multiple steps with minA, educated on use of rail or assist for all stairs to improve safety. The pt's daughter was present and verbalized understanding of all education, confirmed 24/7 supervision by family. The pt will continue to benefit from skilled OP neuro PT to maximize functional return, but is safe to return home with family.    Follow Up Recommendations  Outpatient PT;Supervision/Assistance - 24 hour     Equipment Recommendations  Rolling walker with 5" wheels (tub bench)    Recommendations for Other Services       Precautions / Restrictions Precautions Precautions: Fall Precaution Comments: Pt is impulsive and demonstrates communication deficits Restrictions Weight Bearing Restrictions: No    Mobility  Bed  Mobility Overal bed mobility: Needs Assistance Bed Mobility: Supine to Sit     Supine to sit: Supervision Sit to supine: Supervision   General bed mobility comments: supervision for safety, cues to low, allow for line management.    Transfers Overall transfer level: Needs assistance Equipment used: 1 person hand held assist Transfers: Sit to/from Stand Sit to Stand: Min assist;Min guard Stand pivot transfers: Min guard       General transfer comment: minA initially for safety, minG within session. pt able to steady with initial stand at times, but not consistently. requiring increased support/supervision  Ambulation/Gait Ambulation/Gait assistance: Min guard Gait Distance (Feet): 150 Feet Assistive device: 1 person hand held assist;None Gait Pattern/deviations: Decreased stride length;Narrow base of support;Step-through pattern;Scissoring;Staggering right;Staggering left;Drifts right/left Gait velocity: decreased Gait velocity interpretation: <1.31 ft/sec, indicative of household ambulator General Gait Details: pt with small steps, narrow BOS, single UE support at times also trialing minG. pt with multiple lateral staggering steps and LOB, needing minA to steady. increased difficulty when given cognitive challenge, pt also with multiple errors in cognitive challenge (ex: "count backwards from 30 by 2" and pt stating "38, 30, 26, 24, 18, 16, 5")   Stairs Stairs: Yes Stairs assistance: Min assist Stair Management: No rails;Alternating pattern;Forwards Number of Stairs: 4 General stair comments: pt using no rail despite instability, decreased safety awareness, encouraged to use rail and HHA for safety   Wheelchair Mobility    Modified Rankin (Stroke Patients Only) Modified Rankin (Stroke Patients Only) Pre-Morbid Rankin Score: No symptoms Modified Rankin: Moderately severe disability     Balance Overall balance assessment: Needs assistance Sitting-balance support:  Single extremity supported Sitting balance-Leahy  Scale: Fair Sitting balance - Comments: maintains static sitting without UE support   Standing balance support: Single extremity supported;During functional activity Standing balance-Leahy Scale: Poor Standing balance comment: reliant on UE support                            Cognition Arousal/Alertness: Awake/alert Behavior During Therapy: Impulsive;WFL for tasks assessed/performed Overall Cognitive Status: Impaired/Different from baseline Area of Impairment: Attention;Memory;Following commands;Awareness;Safety/judgement;Problem solving                   Current Attention Level: Focused Memory: Decreased recall of precautions;Decreased short-term memory Following Commands: Follows one step commands consistently;Follows multi-step commands with increased time Safety/Judgement: Decreased awareness of safety;Decreased awareness of deficits Awareness: Intellectual Problem Solving: Slow processing;Decreased initiation;Difficulty sequencing;Requires verbal cues General Comments: pt able to follow simple commands, but when given instructions or completing mobility on her own, pt with very poor safety awareness and decision making. The pt requried repeated cues for safety, and had instances of describing home set up despite lack of prompting by PT, pt alert and oriented to place and situation, but continued describing home features in relation to stairs at hospital. pt unaware of lack of safety awareness/confusion         General Comments General comments (skin integrity, edema, etc.): daughter present and family on facetime, all questions answered      Pertinent Vitals/Pain Pain Assessment: Faces Faces Pain Scale: Hurts a little bit Pain Location: head Pain Descriptors / Indicators: Grimacing;Guarding;Discomfort;Operative site guarding Pain Intervention(s): Limited activity within patient's tolerance;Monitored during  session;Repositioned           PT Goals (current goals can now be found in the care plan section) Acute Rehab PT Goals Patient Stated Goal: for pain and dizziness to improve PT Goal Formulation: With patient/family Time For Goal Achievement: 03/07/21 Potential to Achieve Goals: Good Progress towards PT goals: Progressing toward goals    Frequency    Min 3X/week      PT Plan Current plan remains appropriate       AM-PAC PT "6 Clicks" Mobility   Outcome Measure  Help needed turning from your back to your side while in a flat bed without using bedrails?: A Little Help needed moving from lying on your back to sitting on the side of a flat bed without using bedrails?: A Little Help needed moving to and from a bed to a chair (including a wheelchair)?: A Little Help needed standing up from a chair using your arms (e.g., wheelchair or bedside chair)?: A Little Help needed to walk in hospital room?: A Little Help needed climbing 3-5 steps with a railing? : A Little 6 Click Score: 18    End of Session Equipment Utilized During Treatment: Gait belt Activity Tolerance: Patient tolerated treatment well Patient left: in bed;with call bell/phone within reach;with bed alarm set;with SCD's reapplied;with family/visitor present Nurse Communication: Mobility status PT Visit Diagnosis: Unsteadiness on feet (R26.81);Other abnormalities of gait and mobility (R26.89);Muscle weakness (generalized) (M62.81);Difficulty in walking, not elsewhere classified (R26.2);Apraxia (R48.2);Other symptoms and signs involving the nervous system (O53.664)     Time: 4034-7425 PT Time Calculation (min) (ACUTE ONLY): 23 min  Charges:  $Gait Training: 8-22 mins $Neuromuscular Re-education: 8-22 mins                     Karma Ganja, PT, DPT   Acute Rehabilitation Department Pager #: [phone]   Lakevia Perris  Garvin Fila 02/23/2021, 11:03 AM

## 2021-02-23 NOTE — Evaluation (Signed)
Speech Language Pathology Evaluation Patient Details Name: Barbara Kim MRN: [id number] DOB: June 19, 1956 Today's Date: 02/23/2021 Time: 7616-0737 SLP Time Calculation (min) (ACUTE ONLY): 23 min  Problem List:  Patient Active Problem List   Diagnosis Date Noted  . Brain tumor ([facility]) 02/20/2021  . Essential hypertension, benign 03/25/2014   Past Medical History:  Past Medical History:  Diagnosis Date  . Chicken pox   . Hypertension   . Hyperthyroidism    25 years ago   Past Surgical History:  Past Surgical History:  Procedure Laterality Date  . TONSILLECTOMY  1983   HPI:  Pt is a 65 y.o. female who presented 5/27 s/p elective surgery for L frontotemporal crani with resection of meningioma. Pt with a large L sphenoid/sylvia meningioma during work up for episodic right hand tremor & worsening confusion and inattention over the last 6-12 months. PMH: hyperthyroidism, HTN.   Assessment / Plan / Recommendation Clinical Impression  Pt presents with a mild fluent aphasia c/b decreased comprehension of complex paragraph info and multi-step commands.  Higher-level word-retrieval errors are present during novel language tasks.  Pt demonstrates some difficulty shifting set during assessment tasks.  There are deficits in selective attention.  Recommend OP SLP to address the above. Pt is very motivated, asks thoughtful questions, and expresses the desire to work hard.  Discussed nature of aphasia and her current strengths.  Pt for potential D/C home today with family support.    SLP Assessment  SLP Recommendation/Assessment: All further Speech Lanaguage Pathology  needs can be addressed in the next venue of care SLP Visit Diagnosis: Aphasia (R47.01)    Follow Up Recommendations  Outpatient SLP    Frequency and Duration   n/a        SLP Evaluation Cognition  Overall Cognitive Status: Impaired/Different from baseline (Simultaneous filing. User may not have seen previous  data.) Arousal/Alertness: Awake/alert Orientation Level: Oriented X4 Attention: Selective Selective Attention: Impaired Safety/Judgment: Impaired       Comprehension  Auditory Comprehension Overall Auditory Comprehension: Impaired Yes/No Questions: Within Functional Limits Commands: Impaired Multistep Basic Commands: 50-74% accurate Conversation: Simple Visual Recognition/Discrimination Discrimination: Within Function Limits Reading Comprehension Reading Status: Not tested    Expression Expression Primary Mode of Expression: Verbal Verbal Expression Overall Verbal Expression: Impaired Automatic Speech: Name;Social Response;Month of year Level of Generative/Spontaneous Verbalization: Conversation Repetition: No impairment Naming: Impairment Responsive: 76-100% accurate Confrontation: Within functional limits Divergent: 50-74% accurate Pragmatics: No impairment Written Expression Dominant Hand: Right   Oral / Motor  Oral Motor/Sensory Function Overall Oral Motor/Sensory Function: Within functional limits Motor Speech Overall Motor Speech: Appears within functional limits for tasks assessed   GO                    Juan Quam Laurice 02/23/2021, 11:09 AM  Estill Bamberg L. Tivis Ringer, [place] Office number [phone] Pager [phone]

## 2021-02-24 ENCOUNTER — Encounter (HOSPITAL_COMMUNITY): Payer: Self-pay | Admitting: Neurosurgery

## 2021-02-24 LAB — SURGICAL PATHOLOGY

## 2021-02-24 MED FILL — Thrombin For Soln 5000 Unit: CUTANEOUS | Qty: 5000 | Status: AC

## 2021-02-24 MED FILL — Thrombin For Soln Kit 20000 Unit: CUTANEOUS | Qty: 1 | Status: AC

## 2021-02-24 NOTE — Discharge Summary (Signed)
Discharge Summary  Date of Admission: 02/20/2021  Date of Discharge: 02/24/21  Attending Physician: Consuella Lose, MD  Hospital Course: Patient was admitted following an uncomplicated craniotomy for a large left sphenoid wing meningioma. Post-op MRI showed gross total resection. She did well with PT, her hospital course was uncomplicated and the patient was discharged home on 02/23/21. She will follow up in clinic with Dr. Kathyrn Sheriff in 2 weeks.  Neurologic exam at discharge:  AOx3, PERRL, EOMI, FS, TM, occasional WFD Strength 5/5 x4, SILTx4, +RUE drift  Discharge diagnosis: Meningioma  Judith Part, MD 02/24/21 2:49 AM

## 2021-03-11 ENCOUNTER — Ambulatory Visit: Payer: Medicare HMO

## 2021-03-11 ENCOUNTER — Other Ambulatory Visit: Payer: Self-pay

## 2021-03-11 ENCOUNTER — Ambulatory Visit: Payer: Medicare HMO | Attending: Neurosurgery | Admitting: Physical Therapy

## 2021-03-11 DIAGNOSIS — R2689 Other abnormalities of gait and mobility: Secondary | ICD-10-CM | POA: Diagnosis not present

## 2021-03-11 DIAGNOSIS — R41842 Visuospatial deficit: Secondary | ICD-10-CM | POA: Insufficient documentation

## 2021-03-11 DIAGNOSIS — R2681 Unsteadiness on feet: Secondary | ICD-10-CM | POA: Insufficient documentation

## 2021-03-11 DIAGNOSIS — M6281 Muscle weakness (generalized): Secondary | ICD-10-CM | POA: Insufficient documentation

## 2021-03-11 DIAGNOSIS — R41844 Frontal lobe and executive function deficit: Secondary | ICD-10-CM | POA: Diagnosis present

## 2021-03-11 DIAGNOSIS — R4701 Aphasia: Secondary | ICD-10-CM | POA: Diagnosis present

## 2021-03-11 DIAGNOSIS — R41841 Cognitive communication deficit: Secondary | ICD-10-CM | POA: Diagnosis present

## 2021-03-11 DIAGNOSIS — R41840 Attention and concentration deficit: Secondary | ICD-10-CM | POA: Diagnosis present

## 2021-03-11 NOTE — Therapy (Signed)
[facility] [address] [place], [place], [address] Phone: [phone]   Fax:  [phone]  Speech Language Pathology Evaluation  Patient Details  Name: Barbara Kim MRN: [id number] Date of Birth: November 01, 1955 Referring Provider (SLP): Consuella Lose, MD   Encounter Date: 03/11/2021   End of Session - 03/11/21 1705     Visit Number 1    Number of Visits 17    Date for SLP Re-Evaluation 05/06/21    Authorization Type [employer]    SLP Start Time 1615    SLP Stop Time  1700    SLP Time Calculation (min) 45 min    Activity Tolerance Patient tolerated treatment well             Past Medical History:  Diagnosis Date   Chicken pox    Hypertension    Hyperthyroidism    25 years ago    Past Surgical History:  Procedure Laterality Date   APPLICATION OF CRANIAL NAVIGATION Left 02/20/2021   Procedure: APPLICATION OF CRANIAL NAVIGATION;  Surgeon: Consuella Lose, MD;  Location: [facility];  Service: Neurosurgery;  Laterality: Left;   CRANIOTOMY Left 02/20/2021   Procedure: Stereotactic LEFT pterional craniotomy for resection of meningioma;  Surgeon: Consuella Lose, MD;  Location: [facility];  Service: Neurosurgery;  Laterality: Left;   TONSILLECTOMY  1983    There were no vitals filed for this visit.       SLP Evaluation [facility] - 03/11/21 1604       SLP Visit Information   SLP Received On 03/09/21    Referring Provider (SLP) Consuella Lose, MD    Onset Date 02-20-21    Medical Diagnosis Benign neoplasm of meninges, unspecified      Subjective   Subjective to improve word finding      Pain Assessment   Pain Score 0-No pain      General Information   HPI Pt is a 65 y.o. female who presented 5/27 s/p elective surgery for L frontotemporal crani with resection of meningioma. Pt with a large L sphenoid/sylvia meningioma during work up for episodic right hand tremor & worsening confusion and  inattention over the last 6-12 months. PMH: hyperthyroidism, HTN. Patient was admitted following an uncomplicated craniotomy for a large left sphenoid wing meningioma.      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     Cognition   Overall Cognitive Status Within Functional Limits for tasks assessed      Auditory Comprehension   Overall Auditory Comprehension Impaired    Yes/No Questions Impaired    Complex Questions 75-100% accurate    Conversation Moderately complex    Interfering Components Processing speed;Visual impairments      Expression   Primary Mode of Expression Verbal      Verbal Expression   Overall Verbal Expression Impaired    Initiation No impairment    Level of Generative/Spontaneous Verbalization Conversation    Repetition No impairment    Naming Impairment    Responsive 76-100% accurate    Confrontation 75-100% accurate    Verbal Errors Inconsistent    Pragmatics No impairment  Written Expression   Dominant Hand Right      Oral Motor/Sensory Function   Overall Oral Motor/Sensory Function Appears within functional limits for tasks assessed      Motor Speech   Overall Motor Speech Appears within functional limits for tasks assessed      Standardized Assessments   Standardized Assessments  Other Assessment   Quick Aphasia Battery (8.59=mild), [place] [phone])                            SLP Education - 03/11/21 1704     Education Details eval results, possible goals, word finding strategies    Person(s) Educated Patient;Child(ren)    Methods Explanation;Demonstration;Handout    Comprehension Verbalized understanding;Returned demonstration;Need further  instruction              SLP Short Term Goals - 03/11/21 1721       SLP SHORT TERM GOAL #1   Title Pt will complete aphasia HEP with occasional min A over 2 sessions    Time 4    Period Weeks    Status New      SLP SHORT TERM GOAL #2   Title Pt will verbally ID and correct word finding in paragraph level reading or picture description tasks with min A over 2 sessions    Time 4    Period Weeks    Status New      SLP SHORT TERM GOAL #3   Title Pt will use word finding compensations in 10 minute mod complex conversation with occasional min A over 2 sessions    Time 4    Period Weeks    Status New      SLP SHORT TERM GOAL #4   Title Pt will answer (yes/no/WH-) questions regarding paragraph length information with occasional cues over 2 sessions    Time 4    Period Weeks    Status New      SLP SHORT TERM GOAL #5   Title Pt will complete communication PROM in first 1-2 sessions    Time 2    Period Weeks    Status New              SLP Long Term Goals - 03/11/21 1724       SLP LONG TERM GOAL #1   Title Pt will complete aphasia HEP with rare min A over 2 sessions    Time 8    Period Weeks    Status New      SLP LONG TERM GOAL #2   Title Pt will use word finding compensations in 15+ minute mod complex conversation with rare min A over 2 sessions    Time 8    Period Weeks    Status New      SLP LONG TERM GOAL #3   Title Pt will demonstrate comprehension of multi-paragraph length information with 80% accuracy over 2 sessions    Time 8    Period Weeks    Status New      SLP LONG TERM GOAL #4   Title Pt will report improved communication effectiveness via communication PROM by last ST session    Time [place] - 03/11/21 1718     Clinical Impression Statement [name] "Barbara Kim" presents for OPST evaluation secondary to resection of  meingioma. Surgery completed 02-20-21. Pt and daughter present for evaluation.  Concern for word finding reported, in which pt provided examples of anomia. Occasional anomia exhibited in conversation. Pt reportedly now requires additional processing time and pt has been noted to repeat same information in conversation. Quick Aphasia Battery and [place] completed, which revealed mild expressive and receptive language impairment. Pt noted to independently utilize descriptions when anomia occured on BNT. Reduced comprehension of complex yes/no questions and mod complex open-ended questions during evaluation. Pt and daughter deny changes in cognition or swallowing. Skilled ST is warranted to address mild aphasia impacting pt's communication effectiveness and to facilitate return to PLOF.    Speech Therapy Frequency 2x / week    Duration 8 weeks    Treatment/Interventions Functional tasks;Patient/family education;Language facilitation;Compensatory techniques;Internal/external aids;SLP instruction and feedback;Multimodal communcation approach;Compensatory strategies    Potential to Achieve Goals Good    SLP Home Exercise Plan provided    Consulted and Agree with Plan of Care Patient;Family member/caregiver    Family Member Consulted daughter             Patient will benefit from skilled therapeutic intervention in order to improve the following deficits and impairments:   Aphasia  Cognitive communication deficit    Problem List Patient Active Problem List   Diagnosis Date Noted   Brain tumor [facility]) 02/20/2021   Essential hypertension, benign 03/25/2014    Alinda Deem, MA CCC-SLP 03/11/2021, 6:01 PM  [facility] [address] [place], [place], [address] Phone: [phone]   Fax:  [phone]  Name: Raelyn Racette MRN: [id number] Date of Birth: [birth year]

## 2021-03-11 NOTE — Patient Instructions (Signed)

## 2021-03-11 NOTE — Patient Instructions (Signed)
Access Code: [email or web address] URL: [email or web address] Date: 03/11/2021 Prepared by: Mady Haagensen  Exercises Standing Tandem Balance with Counter Support - 1-2 x daily - 5 x weekly - 1 sets - 3 reps - 30 sec hold Single Leg Stance with Support - 1-2 x daily - 5 x weekly - 1 sets - 3 reps - 10 sec hold

## 2021-03-12 ENCOUNTER — Encounter: Payer: Self-pay | Admitting: Occupational Therapy

## 2021-03-12 ENCOUNTER — Ambulatory Visit: Payer: Medicare HMO | Admitting: Occupational Therapy

## 2021-03-12 DIAGNOSIS — R41840 Attention and concentration deficit: Secondary | ICD-10-CM | POA: Diagnosis not present

## 2021-03-12 DIAGNOSIS — R41842 Visuospatial deficit: Secondary | ICD-10-CM

## 2021-03-12 DIAGNOSIS — R2689 Other abnormalities of gait and mobility: Secondary | ICD-10-CM | POA: Diagnosis not present

## 2021-03-12 DIAGNOSIS — R41844 Frontal lobe and executive function deficit: Secondary | ICD-10-CM

## 2021-03-12 DIAGNOSIS — R4701 Aphasia: Secondary | ICD-10-CM | POA: Diagnosis not present

## 2021-03-12 DIAGNOSIS — R41841 Cognitive communication deficit: Secondary | ICD-10-CM | POA: Diagnosis not present

## 2021-03-12 DIAGNOSIS — R2681 Unsteadiness on feet: Secondary | ICD-10-CM

## 2021-03-12 DIAGNOSIS — M6281 Muscle weakness (generalized): Secondary | ICD-10-CM | POA: Diagnosis not present

## 2021-03-12 NOTE — Therapy (Signed)
[facility] [address] [place], [place], [address] Phone: [phone]   Fax:  [phone]  Physical Therapy Evaluation  Patient Details  Name: Barbara Kim MRN: [id number] Date of Birth: [birth year] Referring Provider (PT): Kathyrn Sheriff   Encounter Date: 03/11/2021   PT End of Session - 03/12/21 1528     Visit Number 1    Number of Visits 17    Date for PT Re-Evaluation 05/08/21    Authorization Type [employer] [place] required (Codes [id number] and [phone] not covered)    PT Start Time 1710    PT Stop Time 1805    PT Time Calculation (min) 55 min    Activity Tolerance Patient tolerated treatment well    Behavior During Therapy [facility] for tasks assessed/performed             Past Medical History:  Diagnosis Date   Chicken pox    Hypertension    Hyperthyroidism    25 years ago    Past Surgical History:  Procedure Laterality Date   APPLICATION OF CRANIAL NAVIGATION Left 02/20/2021   Procedure: APPLICATION OF CRANIAL NAVIGATION;  Surgeon: Consuella Lose, MD;  Location: [facility];  Service: Neurosurgery;  Laterality: Left;   CRANIOTOMY Left 02/20/2021   Procedure: Stereotactic LEFT pterional craniotomy for resection of meningioma;  Surgeon: Consuella Lose, MD;  Location: [facility];  Service: Neurosurgery;  Laterality: Left;   TONSILLECTOMY  1983    There were no vitals filed for this visit.    Subjective Assessment - 03/11/21 1714     Subjective Pt reports having L benign meningioma (craniotomy) removal on 02/20/2021.  Pt was working as Geophysicist/field seismologist prior to the surgery.  Feel like my balance has been affected, overall decreased stamina and endurance.  No falls since surgery.  Balance was exceptioal before, as she did yoga prior to surgery.    Patient is accompained by: Family member   daughter   Pertinent History hyperthyroidism, HTN    Patient Stated Goals Pt's goals for physical therapy are to improve  balance.    Currently in Pain? No/denies                [facility] PT Assessment - 03/11/21 1718       Assessment   Medical Diagnosis benign neoplasm of meneinges    Referring Provider (PT) Kathyrn Sheriff    Onset Date/Surgical Date 03/05/21   MD order   Hand Dominance Right    Next MD Visit 04/01/2021      Precautions   Precautions Fall    Precaution Comments Pt is experiencing double vision.  Pt to not be left alone (when d/c from hospital due to impulsivity)      Balance Screen   Has the patient fallen in the past 6 months No    Has the patient had a decrease in activity level because of a fear of falling?  No    Is the patient reluctant to leave their home because of a fear of falling?  No      Home Environment   Living Environment Private residence    Living Arrangements Children;Spouse/significant other   Daughter lives with them for a few more weeks   Available Help at Discharge Family    Type of [place] to enter    Entrance Stairs-Number of Steps 5    Entrance Stairs-Rails [place] Two level;Able to live on main level with  bedroom/bathroom    Home Equipment None      Prior Function   Level of Independence Independent    Vocation Full time employment    Vocation Requirements Massage therapist    Leisure Walking, running daily, yoga      Cognition   Behaviors Impulsive      Observation/Other Assessments   Focus on Therapeutic Outcomes (FOTO)  NA      Sensation   Light Touch Appears Intact      Posture/Postural Control   Posture/Postural Control No significant limitations      ROM / Strength   AROM / PROM / Strength Strength      Strength   Overall Strength Within functional limits for tasks performed    Overall Strength Comments BLEs      Transfers   Transfers Sit to Stand;Stand to Sit    Sit to Stand 6: Modified independent (Device/Increase time);Without upper extremity assist;From chair/3-in-1    Stand to Sit 6:  Modified independent (Device/Increase time);Without upper extremity assist;To chair/3-in-1      Ambulation/Gait   Ambulation/Gait Yes    Ambulation/Gait Assistance 5: Supervision    Ambulation Distance (Feet) 200 Feet    Assistive device None    Gait Pattern Step-through pattern;Decreased arm swing - right;Decreased arm swing - left;Wide base of support    Ambulation Surface Level;Indoor    Gait velocity 9.25 sec = 3.55 ft/sec      6 Minute Walk- Baseline   6 Minute Walk- Baseline yes    BP (mmHg) 164/72    HR (bpm) 72    02 Sat (%RA) 100 %      6 Minute walk- Post Test   6 Minute Walk Post Test yes    BP (mmHg) 146/72    HR (bpm) 65    02 Sat (%RA) 100 %    Modified Borg Scale for Dyspnea 0- Nothing at all    Perceived Rate of Exertion (Borg) 7- Very, very light      6 minute walk test results    Aerobic Endurance Distance Walked 1319    Endurance additional comments 1765 ft is normal for 71-69 yo females      High Level Balance   High Level Balance Comments Tandem stance:  Pt able to perform 30 seconds each foot position, increased trunk sway.  SLS:  RLE 10 sec (incr. sway), LLE 7.9 sec      Functional Gait  Assessment   Gait assessed  Yes    Gait Level Surface Walks 20 ft in less than 7 sec but greater than 5.5 sec, uses assistive device, slower speed, mild gait deviations, or deviates 6-10 in outside of the 12 in walkway width.   5.8   Change in Gait Speed Able to change speed, demonstrates mild gait deviations, deviates 6-10 in outside of the 12 in walkway width, or no gait deviations, unable to achieve a major change in velocity, or uses a change in velocity, or uses an assistive device.    Gait with Horizontal Head Turns Performs head turns with moderate changes in gait velocity, slows down, deviates 10-15 in outside 12 in walkway width but recovers, can continue to walk.    Gait with Vertical Head Turns Performs task with slight change in gait velocity (eg, minor  disruption to smooth gait path), deviates 6 - 10 in outside 12 in walkway width or uses assistive device    Gait and Pivot Turn Pivot turns safely within 3 sec  and stops quickly with no loss of balance.    Step Over Obstacle Is able to step over one shoe box (4.5 in total height) without changing gait speed. No evidence of imbalance.    Gait with Narrow Base of Support Ambulates 4-7 steps.    Gait with Eyes Closed Walks 20 ft, slow speed, abnormal gait pattern, evidence for imbalance, deviates 10-15 in outside 12 in walkway width. Requires more than 9 sec to ambulate 20 ft.   9.66   Ambulating Backwards Walks 20 ft, no assistive devices, good speed, no evidence for imbalance, normal gait    Steps Alternating feet, must use rail.    Total Score 19    FGA comment: Scores <22/30 indicate increased fall risk.                        Objective measurements completed on examination: See above findings.               PT Education - 03/12/21 1527     Education Details PT eval results, POC; initiated HEP    Person(s) Educated Patient;Child(ren)    Methods Explanation;Demonstration;Verbal cues;Handout    Comprehension Verbalized understanding;Returned demonstration              PT Short Term Goals - 03/12/21 1541       PT SHORT TERM GOAL #1   Title Pt will be independent with HEP for improved strength, balance, transfers, and gait.  TARGET 04/10/21    Time 4    Period Weeks    Status New      PT SHORT TERM GOAL #2   Title Pt will improve FGA score to at least 22/30 to decrease fall risk    Baseline 19/30    Time 4    Period Weeks    Status New      PT SHORT TERM GOAL #3   Title Pt will improve SLS LLE to 10 seconds for improved overall balance and stability.    Baseline 7.9 sec    Time 4    Period Weeks    Status New               PT Long Term Goals - 03/12/21 1542       PT LONG TERM GOAL #1   Title Pt will be independent with HEP for  improved strength, balance, transfers, and gait.  TARGET 05/08/2021    Time 8    Period Weeks    Status New      PT LONG TERM GOAL #2   Title Pt will improve FGA score to at least 25/30 to decrease fall risk.    Time 8    Period Weeks    Status New      PT LONG TERM GOAL #3   Title Pt will improve 6 MWT to at least 1500 ft for improved gait efficiency and endurance.    Baseline 1319 ft    Time 8    Period Weeks    Status New      PT LONG TERM GOAL #4   Title Pt will verbalize plans for return to community fitness upon d/c from PT.    Time 8    Period Weeks    Status New                    Plan - 03/12/21 1534     Clinical Impression Statement Pt is a  65 year old female who presents to OPPT with benign neoplasm of meninges.  Patient was admitted following an uncomplicated craniotomy for a large left sphenoid wing meningioma. Post-op MRI showed gross total resection. She did well with PT, her hospital course was uncomplicated and the patient was discharged home on 02/23/21.  She presents to OPPT with goals to improve balance and return to independent and active level of mobility.  Pt is at increased risk of falls per FGA score of 19/30, she demo decreased hip stability and strength with decreased SLS time on LLE.  She demo decreased balance, decreased endurance for gait. decreased independence/safety with gait.  She would benefit from skilled PT to address the above stated deficits to decrease fall risk and improve overall independence and functional mobility.    Personal Factors and Comorbidities Comorbidity 2    Comorbidities PMH: hyperthyroidism, HTN    Examination-Activity Limitations Locomotion Level;Stand    Examination-Participation Restrictions Community Activity;Occupation   Insurance account manager Stable/Uncomplicated    Clinical Decision Making Low    Rehab Potential Good    PT Frequency 2x / week    PT Duration 8 weeks   plus eval   PT  Treatment/Interventions ADLs/Self Care Home Management;Gait training;Functional mobility training;Stair training;Therapeutic activities;Therapeutic exercise;Balance training;Neuromuscular re-education;Patient/family education    PT Next Visit Plan Review initial HEP; continue to work on SLS balance, core and hip stability, dynamic balance, compliant surfaces; add to HEP    Consulted and Agree with Plan of Care Patient;Family member/caregiver    Family Member Consulted daughter             Patient will benefit from skilled therapeutic intervention in order to improve the following deficits and impairments:  Abnormal gait, Difficulty walking, Decreased endurance, Decreased balance, Decreased mobility  Visit Diagnosis: Other abnormalities of gait and mobility  Unsteadiness on feet  Muscle weakness (generalized)     Problem List Patient Active Problem List   Diagnosis Date Noted   Brain tumor ([place]) 02/20/2021   Essential hypertension, benign 03/25/2014    Tephanie Escorcia W. 03/12/2021, 3:46 PM Frazier Butt., PT   [facility] [address] [place], [place], [address] Phone: [phone]   Fax:  [phone]  Name: Barbara Kim MRN: [id number] Date of Birth: [birth year]

## 2021-03-13 NOTE — Therapy (Signed)
[facility] [address] [place], [place], [address] Phone: [phone]   Fax:  [phone]  Occupational Therapy Evaluation  Patient Details  Name: Barbara Kim MRN: [id number] Date of Birth: September 25, 1956 Referring Provider (OT): Sherron Ales   Encounter Date: 03/12/2021   OT End of Session - 03/12/21 1451     Visit Number 1    Number of Visits 17   8 weeks plan of care - anticipate discharge earlier   Date for OT Re-Evaluation 05/07/21    Authorization Type Aetna Medicare    Authorization Time Period $35 copay per day    Authorization - Number of Visits 10    OT Start Time 1400    OT Stop Time 1445    OT Time Calculation (min) 45 min    Activity Tolerance Patient tolerated treatment well    Behavior During Therapy [facility] for tasks assessed/performed             Past Medical History:  Diagnosis Date   Chicken pox    Hypertension    Hyperthyroidism    25 years ago    Past Surgical History:  Procedure Laterality Date   APPLICATION OF CRANIAL NAVIGATION Left 02/20/2021   Procedure: APPLICATION OF CRANIAL NAVIGATION;  Surgeon: Consuella Lose, MD;  Location: [place];  Service: Neurosurgery;  Laterality: Left;   CRANIOTOMY Left 02/20/2021   Procedure: Stereotactic LEFT pterional craniotomy for resection of meningioma;  Surgeon: Consuella Lose, MD;  Location: [place] [facility] [place];  Service: Neurosurgery;  Laterality: Left;   TONSILLECTOMY  1983    There were no vitals filed for this visit.   Subjective Assessment - 03/12/21 1501     Subjective  65 y.o. female who presented 5/27 s/p elective surgery for L frontotemporal crani with resection of meningioma. Pt with a large L sphenoid/sylvia meningioma during work up for episodic right hand tremor & worsening confusion and inattention over the last 6-12 months. PMH: hyperthyroidism, HTN. PT reports independent PLOF and was very active with running, yoga and walking. Pt  enjoys reading but reports difficulty with this right now.    Patient is accompanied by: Family member   daughter, Barbara Kim   Pertinent History PMH: hyperthyroidism, HTN    Limitations Fall Risk. No driving.    Patient Stated Goals "go back and enjoy reading again"    Currently in Pain? No/denies               [facility] OT Assessment - 03/13/21 1138       Assessment   Medical Diagnosis Benign Neoplasm of Meninges    Referring Provider (OT) Sherron Ales    Onset Date/Surgical Date 02/20/21   surgery   Hand Dominance Right    Next MD Visit 04/01/2021    Prior Therapy Acute      Precautions   Precautions Fall    Precaution Comments impuslive, diplopia      Balance Screen   Has the patient fallen in the past 6 months No      Home  Environment   Family/patient expects to be discharged to: Private residence   with spouse   Living Arrangements Spouse/significant other   + 2 cats   Type of [place] Two level   bed and bath on main   Bathroom Shower/Tub Tub/Shower unit    [place]      Prior Function   Level of [place] Full  time employment    Media planner    Leisure reading, watch TV, running, walking, yoga      ADL   Eating/Feeding Modified independent    Grooming Modified independent    Upper Body Bathing Modified independent    Lower Body Bathing Modified independent    Upper Body Dressing Independent    Lower Body Dressing Modified independent    Toilet Transfer Modified independent    Toileting - Clothing Manipulation Modified independent    [place] Transfer Supervision/safety      IADL   Prior Level of Function Shopping independent    Shopping Needs to be accompanied on any shopping trip    Prior Level of Function Light Housekeeping independent    Light Housekeeping Does personal laundry completely   takes increased time    Prior Level of Function Meal Prep independent    Meal Prep Does not utilize stove or oven    Prior Level of Function Community Mobility driving prior    Nurse, children's Relies on family or friends for transportation    Prior Level of Function Medication Managment independent    Medication Management Takes responsibility if medication is prepared in advance in seperate dosage    Prior Level of Function Financial Management independent    Financial Management Requires assistance   spouse does and did prior     Mobility   Mobility Status Comments ambulates without AD      Vision - History   Baseline Vision Wears glasses only for reading      Vision Assessment   Tracking/Visual Pursuits Able to track stimulus in all quads without difficulty    Saccades Decreased speed of saccadic movement   left   Convergence Within functional limits    Diplopia Assessment Objects split side to side   disappears with one eye closed   Comment pt reports diplopia      Cognition   Overall Cognitive Status Impaired/Different from baseline    Area of Impairment Attention;Following commands;Awareness;Safety/judgement;Problem solving    Behaviors Impulsive      Observation/Other Assessments   Focus on Therapeutic Outcomes (FOTO)  N/A      Posture/Postural Control   Posture/Postural Control No significant limitations      Sensation   Light Touch Appears Intact      Coordination   9 Hole Peg Test Right;Left    Right 9 Hole Peg Test 23.94s    Left 9 Hole Peg Test 26.81s    Tremors tremors prior to resection      Perception   Perception Impaired      AROM   Overall AROM  Within functional limits for tasks performed      Strength   Overall Strength Within functional limits for tasks performed      Hand Function   Right Hand Grip (lbs) 32.4    Left Hand Grip (lbs) 34.3                               OT Short Term Goals - 03/13/21 1129       OT SHORT TERM GOAL #1    Title Pt/caregiver will verbalize understanding of memory compensation strategies    Time 4    Period Weeks    Status New    Target Date 04/10/21      OT SHORT TERM GOAL #2  Title Pt will attend to a novel cognitive task for 5 minutes with no cue for redirection    Time 4    Period Weeks    Status New      OT SHORT TERM GOAL #3   Title Pt will be independent with diplopia HEP    Time 4    Period Weeks    Status New      OT SHORT TERM GOAL #4   Title Pt will complete environmental scanning with 90% accuracy or greater    Time 4    Period Weeks    Status New               OT Long Term Goals - 03/13/21 1135       OT LONG TERM GOAL #1   Title Pt will report significant decrease in diplopia with watching TV and reading and other daily activities.    Time 8    Period Weeks    Status New    Target Date 05/08/21      OT LONG TERM GOAL #2   Title Pt will perform physical and cognitive task simultaneously with 90% accuracy or greater    Time 8    Period Weeks    Status New      OT LONG TERM GOAL #3   Title Pt will plan and execute a meal for her spouse and herself with supervision and good safety awareness    Time 8    Period Weeks    Status New      OT LONG TERM GOAL #4   Title Pt will verbalize understanding of cognitive and adapted strategies and equipment PRN for increased safety and independence with ADLs and IADLs.    Time 8    Period Weeks    Status New                   Plan - 03/12/21 1443     Clinical Impression Statement Pt is a 65 y.o. female who presented 5/27 s/p elective surgery for L frontotemporal crani with resection of meningioma. Pt with a large L sphenoid/sylvia meningioma during work up for episodic right hand tremor & worsening confusion and inattention over the last 6-12 months. PMH: hyperthyroidism, HTN.Pt presents for OPOT with deficits related to double vision and decreased attention and problem solving and unsteadiness on  feet. Skilled occupational therapy is recommended to target listed areas of deficit and increase independence with ADLs and IADLs.    OT Occupational Profile and History Problem Focused Assessment - Including review of records relating to presenting problem    Occupational performance deficits (Please refer to evaluation for details): IADL's;ADL's;Leisure;Work    Marketing executive / Function / Physical Skills ADL;Decreased knowledge of use of DME;FMC;Vision;IADL;ROM;UE functional use;GMC;Balance;Strength    Cognitive Skills Attention;Safety Awareness;Problem Solve    Rehab Potential Good    Clinical Decision Making Limited treatment options, no task modification necessary    Comorbidities Affecting Occupational Performance: None    Modification or Assistance to Complete Evaluation  No modification of tasks or assist necessary to complete eval    OT Frequency 2x / week    OT Duration 8 weeks   plan of care for 8 week - anticipate d/c earlier.   OT Treatment/Interventions Self-care/ADL training;Energy conservation;Patient/family education;Visual/perceptual remediation/compensation;Functional Mobility Training;Neuromuscular education;Therapeutic exercise;Cognitive remediation/compensation;Therapeutic activities;DME and/or AE instruction    Plan HEP for diplopia, environmental scanning  Patient will benefit from skilled therapeutic intervention in order to improve the following deficits and impairments:   Body Structure / Function / Physical Skills: ADL, Decreased knowledge of use of DME, FMC, Vision, IADL, ROM, UE functional use, GMC, Balance, Strength Cognitive Skills: Attention, Safety Awareness, Problem Solve     Visit Diagnosis: Attention and concentration deficit - Plan: Ot plan of care cert/re-cert  Frontal lobe and executive function deficit - Plan: Ot plan of care cert/re-cert  Visuospatial deficit - Plan: Ot plan of care cert/re-cert  Unsteadiness on feet  Other  abnormalities of gait and mobility    Problem List Patient Active Problem List   Diagnosis Date Noted   Brain tumor [facility]) 02/20/2021   Essential hypertension, benign 03/25/2014    Zachery Conch MOT, OTR/L  03/13/2021, 11:40 AM  [facility] [address] [place], [place], [address] Phone: [phone]   Fax:  [phone]  Name: Barbara Kim MRN: [id number] Date of Birth: Dec 18, 1955

## 2021-03-16 ENCOUNTER — Ambulatory Visit: Payer: Medicare HMO | Admitting: Physical Therapy

## 2021-03-16 ENCOUNTER — Encounter: Payer: Self-pay | Admitting: Physical Therapy

## 2021-03-16 ENCOUNTER — Ambulatory Visit: Payer: Medicare HMO | Admitting: Speech Pathology

## 2021-03-16 ENCOUNTER — Encounter: Payer: Self-pay | Admitting: Speech Pathology

## 2021-03-16 ENCOUNTER — Other Ambulatory Visit: Payer: Self-pay

## 2021-03-16 DIAGNOSIS — R2681 Unsteadiness on feet: Secondary | ICD-10-CM | POA: Diagnosis not present

## 2021-03-16 DIAGNOSIS — R41844 Frontal lobe and executive function deficit: Secondary | ICD-10-CM | POA: Diagnosis not present

## 2021-03-16 DIAGNOSIS — R2689 Other abnormalities of gait and mobility: Secondary | ICD-10-CM

## 2021-03-16 DIAGNOSIS — M6281 Muscle weakness (generalized): Secondary | ICD-10-CM

## 2021-03-16 DIAGNOSIS — R41841 Cognitive communication deficit: Secondary | ICD-10-CM | POA: Diagnosis not present

## 2021-03-16 DIAGNOSIS — R41842 Visuospatial deficit: Secondary | ICD-10-CM | POA: Diagnosis not present

## 2021-03-16 DIAGNOSIS — R4701 Aphasia: Secondary | ICD-10-CM

## 2021-03-16 DIAGNOSIS — R41840 Attention and concentration deficit: Secondary | ICD-10-CM | POA: Diagnosis not present

## 2021-03-16 NOTE — Patient Instructions (Signed)
Access Code: [email or web address] URL: [email or web address] Date: 03/16/2021 Prepared by: Mady Haagensen  Exercises Standing Tandem Balance with Counter Support - 1-2 x daily - 5 x weekly - 1 sets - 3 reps - 30 sec hold Single Leg Stance with Support - 1-2 x daily - 5 x weekly - 1 sets - 3 reps - 10 sec hold Added 03/16/21 Walking Tandem Stance - 1 x daily - 5 x weekly - 1 sets - 3 reps Sidestepping - 1 x daily - 5 x weekly - 1 sets - 3 reps

## 2021-03-16 NOTE — Therapy (Signed)
[facility] [address] [place], [place], [address] Phone: [phone]   Fax:  [phone]  Physical Therapy Treatment  Patient Details  Name: Barbara Kim MRN: [id number] Date of Birth: [birth year] Referring Provider (PT): Kathyrn Sheriff   Encounter Date: 03/16/2021   PT End of Session - 03/16/21 0805     Visit Number 2    Number of Visits 17    Date for PT Re-Evaluation 05/08/21    Authorization Type Aetna [place] required (Codes [phone] and [phone] not covered)    PT Start Time 0805    PT Stop Time 0845    PT Time Calculation (min) 40 min    Activity Tolerance Patient tolerated treatment well    Behavior During Therapy [facility] for tasks assessed/performed             Past Medical History:  Diagnosis Date   Chicken pox    Hypertension    Hyperthyroidism    25 years ago    Past Surgical History:  Procedure Laterality Date   APPLICATION OF CRANIAL NAVIGATION Left 02/20/2021   Procedure: APPLICATION OF CRANIAL NAVIGATION;  Surgeon: Consuella Lose, MD;  Location: [facility];  Service: Neurosurgery;  Laterality: Left;   CRANIOTOMY Left 02/20/2021   Procedure: Stereotactic LEFT pterional craniotomy for resection of meningioma;  Surgeon: Consuella Lose, MD;  Location: [facility];  Service: Neurosurgery;  Laterality: Left;   TONSILLECTOMY  1983    There were no vitals filed for this visit.   Subjective Assessment - 03/16/21 0805     Subjective Went to the zoo in [place], and did a lot of walking.  Took plenty of breaks.    Patient is accompained by: Family member   daughter   Pertinent History hyperthyroidism, HTN    Patient Stated Goals Pt's goals for physical therapy are to improve balance.    Currently in Pain? No/denies                Access Code: [email or web address] URL: [email or web address] Date: 03/11/2021 Prepared by: Mady Haagensen   Exercises-Reviewed as HEP given last visit with pt  return demo understanding. Standing Tandem Balance with Counter Support - 1-2 x daily - 5 x weekly - 1 sets - 3 reps - 30 sec hold  (pt able to perform without UE support) Single Leg Stance with Support - 1-2 x daily - 5 x weekly - 1 sets - 3 reps - 10 sec hold (pt able to perform no UE support)  Cues for both to relax arms by her sides and for use of visual target for additional steadiness.               Grandfather Adult PT Treatment/Exercise - 03/16/21 0001       Exercises   Exercises Other Exercises    Other Exercises  Seated on green therapy ball for abdominal activation:  alt UE lifts x 5, BUE lifts x 5 reps, BUE trunk rotation x 5 reps; all x 2 sets.  Seated march 2 sets x 5 reps, LAQ 2 sets x 5 reps; alt UE/leg lift x 5 reps, 2 sets.  Ant/post pelvic tilt x 10, lateral pelvic tilt x 10 reps.                 Balance Exercises - 03/16/21 0001       Balance Exercises: Standing   Standing Eyes Opened Wide (BOA);Narrow base of support (BOS);Head turns;5 reps;Limitations;Solid surface;Foam/compliant surface    Standing Eyes Opened  Limitations Head turns/head nods    Standing Eyes Closed Wide (BOA);Narrow base of support (BOS);Solid surface;5 reps;Limitations    Standing Eyes Closed Limitations Head turns/head turns    Tandem Gait Forward;Retro;3 reps;Limitations    Tandem Gait Limitations min guard, no UE support, cues for use of visual target    Sidestepping Head turns;3 reps    Marching Solid surface;Dynamic;Forwards;Limitations    Marching Limitations 3 reps along counter, supervision from PT.  Progressed to tandem march, 3 reps forward/back.               PT Education - 03/16/21 0842     Education Details Additions to [employer]) Educated Patient;Child(ren)    Methods Explanation;Demonstration;Handout    Comprehension Verbalized understanding;Returned demonstration              PT Short Term Goals - 03/12/21 1541       PT SHORT TERM GOAL #1    Title Pt will be independent with HEP for improved strength, balance, transfers, and gait.  TARGET 04/10/21    Time 4    Period Weeks    Status New      PT SHORT TERM GOAL #2   Title Pt will improve FGA score to at least 22/30 to decrease fall risk    Baseline 19/30    Time 4    Period Weeks    Status New      PT SHORT TERM GOAL #3   Title Pt will improve SLS LLE to 10 seconds for improved overall balance and stability.    Baseline 7.9 sec    Time 4    Period Weeks    Status New               PT Long Term Goals - 03/12/21 1542       PT LONG TERM GOAL #1   Title Pt will be independent with HEP for improved strength, balance, transfers, and gait.  TARGET 05/08/2021    Time 8    Period Weeks    Status New      PT LONG TERM GOAL #2   Title Pt will improve FGA score to at least 25/30 to decrease fall risk.    Time 8    Period Weeks    Status New      PT LONG TERM GOAL #3   Title Pt will improve 6 MWT to at least 1500 ft for improved gait efficiency and endurance.    Baseline 1319 ft    Time 8    Period Weeks    Status New      PT LONG TERM GOAL #4   Title Pt will verbalize plans for return to community fitness upon d/c from PT.    Time 8    Period Weeks    Status New                   Plan - 03/16/21 3664     Clinical Impression Statement Reviewed and updated HEP this visit, continueing to address dynamic and static balance.  Also addressed with use of green physioball, abdominal activation exercises for improved core stability.  With corner balance exercises, pt has increased difficulty with narrow BOS on complaint surfaces with head turns.  She will continue to benefit from skilled PT to further address balance and gait towards goals for improved independence and functional mobility.    Personal Factors and Comorbidities Comorbidity 2    Comorbidities  PMH: hyperthyroidism, HTN    Examination-Activity Limitations Locomotion Level;Stand     Examination-Participation Restrictions Community Activity;Occupation   Insurance account manager Stable/Uncomplicated    Rehab Potential Good    PT Frequency 2x / week    PT Duration 8 weeks   plus eval   PT Treatment/Interventions ADLs/Self Care Home Management;Gait training;Functional mobility training;Stair training;Therapeutic activities;Therapeutic exercise;Balance training;Neuromuscular re-education;Patient/family education    PT Next Visit Plan Review progression of HEP; continue to work on SLS balance, core and hip stability, dynamic balance, compliant surfaces; add to HEP    Consulted and Agree with Plan of Care Patient;Family member/caregiver    Family Member Consulted daughter             Patient will benefit from skilled therapeutic intervention in order to improve the following deficits and impairments:  Abnormal gait, Difficulty walking, Decreased endurance, Decreased balance, Decreased mobility  Visit Diagnosis: Unsteadiness on feet  Other abnormalities of gait and mobility  Muscle weakness (generalized)     Problem List Patient Active Problem List   Diagnosis Date Noted   Brain tumor ([place]) 02/20/2021   Essential hypertension, benign 03/25/2014    Maame Dack W. 03/16/2021, 11:50 AM Frazier Butt., PT   [facility] [address] [place], [place], [address] Phone: [phone]   Fax:  [phone]  Name: Barbara Kim MRN: [id number] Date of Birth: Jul 26, 1956

## 2021-03-16 NOTE — Patient Instructions (Signed)
   Reading and listening comprehension are affected at a high level. When it comes to medical, insurance, professional written materials, have some help  When you are reading, try a bookmark or index card to help your brain focus on the words and line easier  3 ring binder with sections for PT/OT/ST to help keep your exercises organized and easily accessible  Start participating in a few more chores as you are able with supervision  Practice describing words on sheet for family to guess, then they can describe words on the other sheet for you to guess to work on auditory comprehension and naming  What category is it? Where do you find it? What does it look like? What to we do with it? What does it remind you of?

## 2021-03-16 NOTE — Therapy (Signed)
[facility] [address] [place], [place], [address] Phone: [phone]   Fax:  [phone]  Speech Language Pathology Treatment  Patient Details  Name: Barbara Kim MRN: [id number] Date of Birth: Mar 17, 1956 Referring Provider (SLP): [facility] Lose, MD   Encounter Date: 03/16/2021   End of Session - 03/16/21 1219     Visit Number 2    Number of Visits 17    Date for SLP Re-Evaluation 05/06/21    Authorization Type Aetna Medicare             Past Medical History:  Diagnosis Date   Chicken pox    Hypertension    Hyperthyroidism    25 years ago    Past Surgical History:  Procedure Laterality Date   APPLICATION OF CRANIAL NAVIGATION Left 02/20/2021   Procedure: APPLICATION OF CRANIAL NAVIGATION;  Surgeon: [facility] Lose, MD;  [facility]: [facility];  Service: Neurosurgery;  Laterality: Left;   CRANIOTOMY Left 02/20/2021   Procedure: Stereotactic LEFT pterional craniotomy for resection of meningioma;  Surgeon: [facility] Lose, MD;  Location: [facility];  Service: Neurosurgery;  Laterality: Left;   TONSILLECTOMY  1983    There were no vitals filed for this visit.   Subjective Assessment - 03/16/21 0847     Subjective "My son said I am doing better last night"    Currently in Pain? No/denies                   ADULT SLP TREATMENT - 03/16/21 0850       General Information   Behavior/Cognition Alert;Cooperative;Pleasant mood      Treatment Provided   Treatment provided Cognitive-Linquistic      Pain Assessment   Pain Assessment No/denies pain      Cognitive-Linquistic Treatment   Treatment focused on Aphasia;Patient/family/caregiver education    Skilled Treatment Barbara Kim required A to find ST handout in her folder. Suggested she get a binder with sections for PT/OT/ST. Filled out [employer] Commun[employer]ative Participation [employer] with A from daughter Barbara Kim, due to Barbara Kim's reduced awareness of auditory  comprehension impairments. Score of 6, with aphasia rated a 0 or very much affects: talking with people she doesn't know, communicating in community, having a long conversation, giving detailed information, particiating in fast moving conversations and trying to persuade others to see a differnt point of view. She scored a 1 or aphasia affects "quite a bit" talking with people she knows and communicating in a small group. Barbara Kim and Barbara Kim report Barbara Kim attended a lunch with 6 friends and did not participate in conversation execpt 2-3x. Moderately complex naming task naming words/terms that Barbara Kim uses profressionally in skin care and massagae fields that beging with given letters. She required extended time and occasional min A to name/write 6 words. She stated "I can't believe I'm not remembering thses words"  Verbal compensations for aphasia initiated. Barbara Kim accurately describe 5 words for Barbara Kim or I to guess with frerquent verbal instructions and questioning cues to use most salient descriptions and include generate category of the word. May consider SFA if this conintues to require frequent min to mod A.      Assessment / Recommendations / Plan   Plan Continue with current plan of care      Progression Toward Goals   Progression toward goals Progressing toward goals              SLP Education - 03/16/21 1217     Education Details compensations for aphasia and  reading comprehension; language activcities to do at home    Person(s) Educated Patient;Child(ren)    Methods Explanation;Demonstration;Verbal cues;Handout    Comprehension Verbalized understanding;Returned demonstration;Verbal cues required;Need further instruction              SLP Short Term Goals - 03/16/21 1218       SLP SHORT TERM GOAL #1   Title Pt will complete aphasia HEP with occasional min A over 2 sessions    Time 4    Period Weeks    Status On-going      SLP SHORT TERM GOAL #2   Title Pt will verbally ID and correct  word finding in paragraph level reading or picture description tasks with min A over 2 sessions    Time 4    Period Weeks    Status On-going      SLP SHORT TERM GOAL #3   Title Pt will use word finding compensations in 10 minute mod complex conversation with occasional min A over 2 sessions    Time 4    Period Weeks    Status On-going      SLP SHORT TERM GOAL #4   Title Pt will answer (yes/no/WH-) questions regarding paragraph length information with occasional cues over 2 sessions    Time 4    Period Weeks    Status On-going      SLP SHORT TERM GOAL #5   Title Pt will complete communication PROM in first 1-2 sessions    Time 2    Period Weeks    Status On-going              SLP Long Term Goals - 03/16/21 1219       SLP LONG TERM GOAL #1   Title Pt will complete aphasia HEP with rare min A over 2 sessions    Time 8    Period Weeks    Status On-going      SLP LONG TERM GOAL #2   Title Pt will use word finding compensations in 15+ minute mod complex conversation with rare min A over 2 sessions    Time 8    Period Weeks    Status On-going      SLP LONG TERM GOAL #3   Title Pt will demonstrate comprehension of multi-paragraph length information with 80% accuracy over 2 sessions    Time 8    Period Weeks    Status On-going      SLP LONG TERM GOAL #4   Title Pt will report improved communication effectiveness via communication PROM by last ST session    Time 8    Period Weeks    Status New      SLP LONG TERM GOAL #5   Status On-going              Plan - 03/16/21 1218     Clinical Impression Statement Barbara Kim "Barbara Kim" presents for OPST evaluation secondary to resection of meingioma. Surgery completed 02-20-21. Pt and daughter present for evaluation. Concern for word finding reported, in which pt provided examples of anomia. Occasional anomia exhibited in conversation. Pt reportedly now requires additional processing time and pt has been noted to repeat same  information in conversation. Quick Aphasia Battery and [place] completed, which revealed mild expressive and receptive language impairment. Pt noted to independently utilize descriptions when anomia occured on BNT. Reduced comprehension of complex yes/no questions and mod complex questions during evaluation. Pt and daughter deny changes in  cognition or swallowing. Skilled ST is warranted to address mild aphasia impacting pt's communication effectiveness and to facilitate with return to PLOF.    Speech Therapy Frequency 2x / week    Duration 8 weeks    Treatment/Interventions Functional tasks;Patient/family education;Language facilitation;Compensatory techniques;Internal/external aids;SLP instruction and feedback;Multimodal communcation approach;Compensatory strategies    Potential to Achieve Goals Good             Patient will benefit from skilled therapeutic intervention in order to improve the following deficits and impairments:   Aphasia    Problem List Patient Active Problem List   Diagnosis Date Noted   Brain tumor [facility]) 02/20/2021   Essential hypertension, benign 03/25/2014    Tamsen Reist, Annye Rusk MS, [place] 03/16/2021, 12:19 PM  [facility] [address] [place], [place], [address] Phone: [phone]   Fax:  [phone]   Name: Barbara Kim MRN: [id number] Date of Birth: 1956/03/02

## 2021-03-18 ENCOUNTER — Other Ambulatory Visit: Payer: Self-pay

## 2021-03-18 ENCOUNTER — Encounter: Payer: Self-pay | Admitting: Physical Therapy

## 2021-03-18 ENCOUNTER — Encounter: Payer: Self-pay | Admitting: Occupational Therapy

## 2021-03-18 ENCOUNTER — Ambulatory Visit: Payer: Medicare HMO | Admitting: Physical Therapy

## 2021-03-18 ENCOUNTER — Ambulatory Visit: Payer: Medicare HMO

## 2021-03-18 ENCOUNTER — Ambulatory Visit: Payer: Medicare HMO | Admitting: Occupational Therapy

## 2021-03-18 DIAGNOSIS — R41842 Visuospatial deficit: Secondary | ICD-10-CM | POA: Diagnosis not present

## 2021-03-18 DIAGNOSIS — R2681 Unsteadiness on feet: Secondary | ICD-10-CM

## 2021-03-18 DIAGNOSIS — R41840 Attention and concentration deficit: Secondary | ICD-10-CM | POA: Diagnosis not present

## 2021-03-18 DIAGNOSIS — R2689 Other abnormalities of gait and mobility: Secondary | ICD-10-CM | POA: Diagnosis not present

## 2021-03-18 DIAGNOSIS — R4701 Aphasia: Secondary | ICD-10-CM | POA: Diagnosis not present

## 2021-03-18 DIAGNOSIS — M6281 Muscle weakness (generalized): Secondary | ICD-10-CM

## 2021-03-18 DIAGNOSIS — R41844 Frontal lobe and executive function deficit: Secondary | ICD-10-CM | POA: Diagnosis not present

## 2021-03-18 DIAGNOSIS — R41841 Cognitive communication deficit: Secondary | ICD-10-CM | POA: Diagnosis not present

## 2021-03-18 NOTE — Patient Instructions (Signed)
Diplopia HEP:  Perform at least 3 times per day. Stop if your eye becomes fatigued or hurts and try again later.  1. Hold a small object/card in front of you.  Hold it in the middle at arm's length away.    2. Cover your LEFT eye and look at the object with your RIGHT eye.  3. Slowly move the object side to side in front of you while continuing to watch it with your RIGHT eye.  4.  Remember to keep your head still and only move your eye.  5.  Repeat 5-10 times.  6.  Then, move object up and down while watching it 5-10 times.  7. Cover your RIGHT eye and look at the object with your LEFT eye while you repeat #1-6 above.  8.  Now, uncover both eyes and try to focus on the object while holding it in the middle.  Try to make it 1 image.   9.  If you can, try to hold it for 10-30 sec increasing as able.    10.  Once you can make the image 1 for at least 30 sec in the middle, repeat #1-6 above with both eyes moving slowly and only in the range that you can keep the image 1.  

## 2021-03-18 NOTE — Patient Instructions (Addendum)
  Watch [employer] - start with 5-10 minutes and recap with [name] or family member         Name different roads/park you run in

## 2021-03-18 NOTE — Therapy (Signed)
[facility] [address] [place], [place], [address] Phone: [phone]   Fax:  [phone]  Physical Therapy Treatment  Patient Details  Name: Barbara Kim MRN: [id number] Date of Birth: 22-Dec-1955 Referring Provider (PT): Kathyrn Sheriff   Encounter Date: 03/18/2021   PT End of Session - 03/18/21 6948     Visit Number 3    Number of Visits 17    Date for PT Re-Evaluation 05/08/21    Authorization Type [employer]na [place] required (Codes [phone] and [phone] not covered)    PT Start Time 1749    PT Stop Time 1829    PT Time Calculation (min) 40 min    Equipment Utilized During Treatment Gait belt    Activity Tolerance Patient tolerated treatment well    Behavior During Therapy WFL for tasks assessed/performed             Past Medical History:  Diagnosis Date   Chicken pox    Hypertension    Hyperthyroidism    25 years ago    Past Surgical History:  Procedure Laterality Date   APPLICATION OF CRANIAL NAVIGATION Left 02/20/2021   Procedure: APPLICATION OF CRANIAL NAVIGATION;  Surgeon: Consuella Lose, MD;  Location: [facility];  Service: Neurosurgery;  Laterality: Left;   CRANIOTOMY Left 02/20/2021   Procedure: Stereotactic LEFT pterional craniotomy for resection of meningioma;  Surgeon: Consuella Lose, MD;  Location: [facility];  Service: Neurosurgery;  Laterality: Left;   TONSILLECTOMY  1983    There were no vitals filed for this visit.   Subjective Assessment - 03/18/21 1749     Subjective Did OT and speech therapy.  Doing the exercises at home.  Having some double vision and looking down with gait.    Patient is accompained by: Family member   daughter   Pertinent History hyperthyroidism, HTN    Patient Stated Goals Pt's goals for physical therapy are to improve balance.    Currently in Pain? No/denies                               [facility] Adult PT Treatment/Exercise - 03/18/21 0001        Ambulation/Gait   Ambulation/Gait Yes    Ambulation/Gait Assistance 5: Supervision    Ambulation/Gait Assistance Details Cues to look ahead, tactile cues at shoulders to relax through shoulders for arm swing.    Ambulation Distance (Feet) 600 Feet   additional 230 ft   Assistive device None    Gait Pattern Step-through pattern;Decreased arm swing - right;Decreased arm swing - left;Wide base of support    Ambulation Surface Level;Indoor      High Level Balance   High Level Balance Comments Pt stands EO and EC 30 sec solid surface; EO and EC 30 sec compliant surface, with incr. sway EC on foam.             Reviewed tandem gait, tandem march, forward/back along counter x 2 reps each; reviewed sidestepping, with cues for added head turns, 2 reps at counter.  Pt return demo understanding, but did say she hadn't incorporated head turns with sidestepping yet.    Balance Exercises - 03/18/21 0001       Balance Exercises: Standing   Standing Eyes Opened Wide (BOA);Narrow base of support (BOS);Head turns;5 reps;Limitations;Foam/compliant surface    Standing Eyes Opened Limitations Head turns/head nods    Standing Eyes Closed Wide (BOA);Narrow base of support (BOS);5 reps;Limitations;Foam/compliant  surface    Standing Eyes Closed Limitations Head turns/head turns.  Cues to hold light support at chair.    Stepping Strategy Anterior;Posterior;Lateral;Foam/compliant surface;10 reps   alternating legs, UE support at counter   Partial Tandem Stance Eyes open;Eyes closed;Upper extremity support 2;5 reps;Limitations    Partial Tandem Stance Limitations Head turns, head nods x 5 EO, EC head steady x 15 sec               PT Education - 03/18/21 1837     Education Details HEP updates/additions; see instructions.  Cues with gait for looking at eye level or slightly lower at ground 10-20 ft ahead to avoid rounded shoulders posture and cues for arm swing    Person(s) Educated Patient;Child(ren)     Methods Explanation;Demonstration;Handout;Verbal cues    Comprehension Verbalized understanding;Returned demonstration;Verbal cues required              PT Short Term Goals - 03/12/21 1541       PT SHORT TERM GOAL #1   Title Pt will be independent with HEP for improved strength, balance, transfers, and gait.  TARGET 04/10/21    Time 4    Period Weeks    Status New      PT SHORT TERM GOAL #2   Title Pt will improve FGA score to at least 22/30 to decrease fall risk    Baseline 19/30    Time 4    Period Weeks    Status New      PT SHORT TERM GOAL #3   Title Pt will improve SLS LLE to 10 seconds for improved overall balance and stability.    Baseline 7.9 sec    Time 4    Period Weeks    Status New               PT Long Term Goals - 03/12/21 1542       PT LONG TERM GOAL #1   Title Pt will be independent with HEP for improved strength, balance, transfers, and gait.  TARGET 05/08/2021    Time 8    Period Weeks    Status New      PT LONG TERM GOAL #2   Title Pt will improve FGA score to at least 25/30 to decrease fall risk.    Time 8    Period Weeks    Status New      PT LONG TERM GOAL #3   Title Pt will improve 6 MWT to at least 1500 ft for improved gait efficiency and endurance.    Baseline 1319 ft    Time 8    Period Weeks    Status New      PT LONG TERM GOAL #4   Title Pt will verbalize plans for return to community fitness upon d/c from PT.    Time 8    Period Weeks    Status New                   Plan - 03/18/21 1839     Clinical Impression Statement Skilled PT session focused today on gait activities incorporating recirpocal arm swing and use of visual targets to improve posture with gait.  Assessed systems of balance with pt standing EO/EC on solid and foam surfaces, with pt noted to have increased sway with EC on foam.  This indicates potential decreased vestibular system use for balance, so continued work on compliant surface corner  and counter activities to  work to improve vestibular system use for balance.  Pt has increased trunk sway with vision removed on compliant surfaces, needing UE support for improved stability.    Personal Factors and Comorbidities Comorbidity 2    Comorbidities PMH: hyperthyroidism, HTN    Examination-Activity Limitations Locomotion Level;Stand    Examination-Participation Restrictions Community Activity;Occupation   Insurance account manager Stable/Uncomplicated    Rehab Potential Good    PT Frequency 2x / week    PT Duration 8 weeks   plus eval   PT Treatment/Interventions ADLs/Self Care Home Management;Gait training;Functional mobility training;Stair training;Therapeutic activities;Therapeutic exercise;Balance training;Neuromuscular re-education;Patient/family education    PT Next Visit Plan Review progression of HEP; continue to work on SLS balance, core and hip stability, dynamic balance, compliant surfaces; vestibular training/corner balance exercise progression    Consulted and Agree with Plan of Care Patient;Family member/caregiver    Family Member Consulted daughter             Patient will benefit from skilled therapeutic intervention in order to improve the following deficits and impairments:  Abnormal gait, Difficulty walking, Decreased endurance, Decreased balance, Decreased mobility  Visit Diagnosis: Unsteadiness on feet  Other abnormalities of gait and mobility     Problem List Patient Active Problem List   Diagnosis Date Noted   Brain tumor ([place]) 02/20/2021   Essential hypertension, benign 03/25/2014    Frazier Butt. 03/18/2021, 6:43 PM Frazier Butt., PT  [facility] [address] [place], [place], [address] Phone: [phone]   Fax:  [phone]  Name: [facility] MRN: [id number] Date of Birth: Apr 19, 1956

## 2021-03-18 NOTE — Therapy (Signed)
[facility] [address] [place], [place], [address] Phone: [phone]   Fax:  [phone]  Occupational Therapy Treatment  Patient Details  Name: Barbara Kim MRN: [id number] Date of Birth: 03-May-1956 Referring Provider (OT): Sherron Ales   Encounter Date: 03/18/2021   OT End of Session - 03/18/21 1708     Visit Number 2    Number of Visits 17   8 weeks plan of care - anticipate discharge earlier   Date for OT Re-Evaluation 05/07/21    Authorization Type [employer]    Authorization Time Period $35 copay per day    Authorization - Visit Number 1    Authorization - Number of Visits 10    OT Start Time 1704    OT Stop Time 1745    OT Time Calculation (min) 41 min    Activity Tolerance Patient tolerated treatment well    Behavior During Therapy [facility] for tasks assessed/performed             Past Medical History:  Diagnosis Date   Chicken pox    Hypertension    Hyperthyroidism    25 years ago    Past Surgical History:  Procedure Laterality Date   APPLICATION OF CRANIAL NAVIGATION Left 02/20/2021   Procedure: APPLICATION OF CRANIAL NAVIGATION;  Surgeon: Consuella Lose, MD;  Location: [place] [facility];  Service: Neurosurgery;  Laterality: Left;   CRANIOTOMY Left 02/20/2021   Procedure: Stereotactic LEFT pterional craniotomy for resection of meningioma;  Surgeon: Consuella Lose, MD;  Location: [place];  Service: Neurosurgery;  Laterality: Left;   TONSILLECTOMY  1983    There were no vitals filed for this visit.   Subjective Assessment - 03/18/21 1702     Subjective  "Smooth" when asked how things are going.    Patient is accompanied by: Family member   daughter, Barbara Kim   Pertinent History PMH: hyperthyroidism, HTN    Limitations Fall Risk. No driving.    Patient Stated Goals "go back and enjoy reading again"    Currently in Pain? No/denies               Diplopia HEP  Small Pegs with  copying pattern. Patient had head down and flexed posture while looking out of anterior portion of eyes for copying pattern. Pt copied pattern with no errors.                   OT Education - 03/18/21 1800     Education Details Diplopia HEP    Person(s) Educated Patient;Child(ren)    Methods Explanation;Demonstration    Comprehension Verbalized understanding;Returned demonstration              OT Short Term Goals - 03/13/21 1129       OT SHORT TERM GOAL #1   Title Pt/caregiver will verbalize understanding of memory compensation strategies    Time 4    Period Weeks    Status New    Target Date 04/10/21      OT SHORT TERM GOAL #2   Title Pt will attend to a novel cognitive task for 5 minutes with no cue for redirection    Time 4    Period Weeks    Status New      OT SHORT TERM GOAL #3   Title Pt will be independent with diplopia HEP    Time 4    Period Weeks    Status New      OT SHORT  TERM GOAL #4   Title Pt will complete environmental scanning with 90% accuracy or greater    Time 4    Period Weeks    Status New               OT Long Term Goals - 03/13/21 1135       OT LONG TERM GOAL #1   Title Pt will report significant decrease in diplopia with watching TV and reading and other daily activities.    Time 8    Period Weeks    Status New    Target Date 05/08/21      OT LONG TERM GOAL #2   Title Pt will perform physical and cognitive task simultaneously with 90% accuracy or greater    Time 8    Period Weeks    Status New      OT LONG TERM GOAL #3   Title Pt will plan and execute a meal for her spouse and herself with supervision and good safety awareness    Time 8    Period Weeks    Status New      OT LONG TERM GOAL #4   Title Pt will verbalize understanding of cognitive and adapted strategies and equipment PRN for increased safety and independence with ADLs and IADLs.    Time 8    Period Weeks    Status New                    Plan - 03/18/21 1709     Clinical Impression Statement Pt agreeable to goals set at evaluation. Demonstrated good understanding of diplopia HEP    OT Occupational Profile and History Problem Focused Assessment - Including review of records relating to presenting problem    Occupational performance deficits (Please refer to evaluation for details): IADL's;ADL's;Leisure;Work    Marketing executive / Function / Physical Skills ADL;Decreased knowledge of use of DME;FMC;Vision;IADL;ROM;UE functional use;GMC;Balance;Strength    Cognitive Skills Attention;Safety Awareness;Problem Solve    Rehab Potential Good    Clinical Decision Making Limited treatment options, no task modification necessary    Comorbidities Affecting Occupational Performance: None    Modification or Assistance to Complete Evaluation  No modification of tasks or assist necessary to complete eval    OT Frequency 2x / week    OT Duration 8 weeks   plan of care for 8 week - anticipate d/c earlier.   OT Treatment/Interventions Self-care/ADL training;Energy conservation;Patient/family education;Visual/perceptual remediation/compensation;Functional Mobility Training;Neuromuscular education;Therapeutic exercise;Cognitive remediation/compensation;Therapeutic activities;DME and/or AE instruction    Plan environmental scanning, cognitive/attention task             Patient will benefit from skilled therapeutic intervention in order to improve the following deficits and impairments:   Body Structure / Function / Physical Skills: ADL, Decreased knowledge of use of DME, FMC, Vision, IADL, ROM, UE functional use, GMC, Balance, Strength Cognitive Skills: Attention, Safety Awareness, Problem Solve     Visit Diagnosis: Unsteadiness on feet  Muscle weakness (generalized)  Attention and concentration deficit  Visuospatial deficit  Frontal lobe and executive function deficit  Other abnormalities of gait and  mobility    Problem List Patient Active Problem List   Diagnosis Date Noted   Brain tumor ([place]) 02/20/2021   Essential hypertension, benign 03/25/2014    Barbara Kim MOT, OTR/L  03/18/2021, 6:00 PM  [facility] [address] [employer] [place], [place], [address] Phone: [phone]   Fax:  [phone]  Name:  Barbara Kim MRN: [id number] Date of Birth: 1956-05-09

## 2021-03-18 NOTE — Therapy (Signed)
[facility] [address] [place], [place], [address] Phone: [phone]   Fax:  [phone]  Speech Language Pathology Treatment  Patient Details  Name: Barbara Kim MRN: [id number] Date of Birth: 1955-10-11 Referring Provider (SLP): Consuella Lose, MD   Encounter Date: 03/18/2021   End of Session - 03/18/21 1801     Visit Number 3    Number of Visits 17    Date for SLP Re-Evaluation 05/06/21    Authorization Type [employer] Medicare    SLP Start Time 1615    SLP Stop Time  1700    SLP Time Calculation (min) 45 min    Activity Tolerance Patient tolerated treatment well             Past Medical History:  Diagnosis Date   Chicken pox    Hypertension    Hyperthyroidism    25 years ago    Past Surgical History:  Procedure Laterality Date   APPLICATION OF CRANIAL NAVIGATION Left 02/20/2021   Procedure: APPLICATION OF CRANIAL NAVIGATION;  Surgeon: Consuella Lose, MD;  Location: [facility];  Service: Neurosurgery;  Laterality: Left;   CRANIOTOMY Left 02/20/2021   Procedure: Stereotactic LEFT pterional craniotomy for resection of meningioma;  Surgeon: Consuella Lose, MD;  Location: [facility];  Service: Neurosurgery;  Laterality: Left;   TONSILLECTOMY  1983    There were no vitals filed for this visit.   Subjective Assessment - 03/18/21 1618     Subjective "we made the notebook"    Patient is accompained by: Family member   daughter, [name]   Currently in Pain? No/denies                   ADULT SLP TREATMENT - 03/18/21 1757       General Information   Behavior/Cognition Alert;Cooperative;Pleasant mood      Treatment Provided   Treatment provided Cognitive-Linquistic      Cognitive-Linquistic Treatment   Treatment focused on Aphasia;Patient/family/caregiver education    Skilled Treatment Pt and daughter arrived with therapy notebook created per SLP recommendation. SLP reviewed HEP, in which  daughter reported need for mod fading to min A due to "lack of specifity" of descriptions; however, pt did improve throughout task. Pt has been reading book about wolves, with daughter asssessing comprehension and recall. SLP suggested Ted Talks to target auditory comprehension. 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 Pt demo'd use of description strategy with occasional min questioning cues to expand description. SLP suggested pt name places she enjoyed running as HEP.      Assessment / Recommendations / Plan   Plan Continue with current plan of care      Progression Toward Goals   Progression toward goals Progressing toward goals              SLP Education - 03/18/21 1801     Education Details Ted Talks for auditory comprehension, functional naming activities    Person(s) Educated Patient;Child(ren)    Methods Explanation;Demonstration;Handout    Comprehension Verbalized understanding;Returned demonstration;Need further instruction              SLP Short Term Goals - 03/18/21 1801       SLP SHORT TERM GOAL #1   Title Pt will complete aphasia HEP with occasional min A over 2 sessions    Time 4    Period Weeks    Status On-going      SLP SHORT TERM GOAL #2  Title Pt will verbally ID and correct word finding in paragraph level reading or picture description tasks with min A over 2 sessions    Time 4    Period Weeks    Status On-going      SLP SHORT TERM GOAL #3   Title Pt will use word finding compensations in 10 minute mod complex conversation with occasional min A over 2 sessions    Time 4    Period Weeks    Status On-going      SLP SHORT TERM GOAL #4   Title Pt will answer (yes/no/WH-) questions regarding paragraph length information with occasional cues over 2 sessions    Time 4    Period Weeks    Status On-going      SLP SHORT TERM GOAL #5   Title Pt will complete communication PROM in first 1-2 sessions     Baseline Communicative Participation Item Bank= 6    Status Achieved              SLP Long Term Goals - 03/18/21 1802       SLP LONG TERM GOAL #1   Title Pt will complete aphasia HEP with rare min A over 2 sessions    Time 8    Period Weeks    Status On-going      SLP LONG TERM GOAL #2   Title Pt will use word finding compensations in 15+ minute mod complex conversation with rare min A over 2 sessions    Time 8    Period Weeks    Status On-going      SLP LONG TERM GOAL #3   Title Pt will demonstrate comprehension of multi-paragraph length information with 80% accuracy over 2 sessions    Time 8    Period Weeks    Status On-going      SLP LONG TERM GOAL #4   Title Pt will report improved communication effectiveness via communication PROM by last ST session    Time 8    Period Weeks    Status New      SLP LONG TERM GOAL #5   Status --              Plan - 03/18/21 1803     Clinical Impression Statement Barbara Mola "Bett" presents with mild expressive and receptive aphasia secondary to resection of meingioma. Surgery completed 02-20-21. Ongoing education and training completed with patient and daughter to maximize word finding and auditory/written comprehension. Occasional min semantic and questioning cues required to improve descriptions when anomia occurs. See "skilled treatment" for additional details. Skilled ST is warranted to address mild aphasia impacting pt's communication effectiveness and to facilitate with return to PLOF.    Speech Therapy Frequency 2x / week    Duration 8 weeks    Treatment/Interventions Functional tasks;Patient/family education;Language facilitation;Compensatory techniques;Internal/external aids;SLP instruction and feedback;Multimodal communcation approach;Compensatory strategies    Potential to Achieve Goals Good    SLP Home Exercise Plan provided    Consulted and Agree with Plan of Care Patient;Family member/caregiver    Family Member  Consulted daughter             Patient will benefit from skilled therapeutic intervention in order to improve the following deficits and impairments:   Aphasia    Problem List Patient Active Problem List   Diagnosis Date Noted   Brain tumor ([place]) 02/20/2021   Essential hypertension, benign 03/25/2014    Alinda Deem, [place] CCC-SLP 03/18/2021, 6:07 PM  [facility] [address] [place], [place], [address] Phone: [phone]   Fax:  [phone]   Name: Barbara Kim MRN: [id number] Date of Birth: Barbara 09, 1957

## 2021-03-18 NOTE — Patient Instructions (Addendum)
Access Code: [email or web address] URL: [email or web address] Date: 03/18/2021 Prepared by: Mady Haagensen  Exercises Walking Tandem Stance - 1 x daily - 5 x weekly - 1 sets - 3 reps Sidestepping - 1 x daily - 5 x weekly - 1 sets - 3 reps  Added 03/18/2021 Standing Balance in Corner - 1 x daily - 5 x weekly - 1 sets - 10 reps Standing Balance in Corner with Eyes Closed - 1 x daily - 5 x weekly - 1 sets - 10 reps   (Removed tandem stance and SLS given initial visit)

## 2021-03-23 ENCOUNTER — Ambulatory Visit: Payer: Medicare HMO

## 2021-03-23 ENCOUNTER — Encounter: Payer: Self-pay | Admitting: Occupational Therapy

## 2021-03-23 ENCOUNTER — Other Ambulatory Visit: Payer: Self-pay

## 2021-03-23 ENCOUNTER — Ambulatory Visit: Payer: Medicare HMO | Admitting: Occupational Therapy

## 2021-03-23 ENCOUNTER — Encounter: Payer: Self-pay | Admitting: Physical Therapy

## 2021-03-23 ENCOUNTER — Ambulatory Visit: Payer: Medicare HMO | Admitting: Physical Therapy

## 2021-03-23 DIAGNOSIS — M6281 Muscle weakness (generalized): Secondary | ICD-10-CM | POA: Diagnosis not present

## 2021-03-23 DIAGNOSIS — R41844 Frontal lobe and executive function deficit: Secondary | ICD-10-CM

## 2021-03-23 DIAGNOSIS — R4701 Aphasia: Secondary | ICD-10-CM | POA: Diagnosis not present

## 2021-03-23 DIAGNOSIS — R41840 Attention and concentration deficit: Secondary | ICD-10-CM | POA: Diagnosis not present

## 2021-03-23 DIAGNOSIS — R2681 Unsteadiness on feet: Secondary | ICD-10-CM

## 2021-03-23 DIAGNOSIS — R41842 Visuospatial deficit: Secondary | ICD-10-CM

## 2021-03-23 DIAGNOSIS — R2689 Other abnormalities of gait and mobility: Secondary | ICD-10-CM | POA: Diagnosis not present

## 2021-03-23 DIAGNOSIS — R41841 Cognitive communication deficit: Secondary | ICD-10-CM | POA: Diagnosis not present

## 2021-03-23 NOTE — Therapy (Signed)
[facility] [address] [place], [place], [address] Phone: [phone]   Fax:  [phone]  Speech Language Pathology Treatment  Patient Details  Name: Barbara Kim MRN: [id number] Date of Birth: [birth year] Referring Provider (SLP): Consuella Lose, MD   Encounter Date: 03/23/2021   End of Session - 03/23/21 1529     Visit Number 4    Number of Visits 17    Date for SLP Re-Evaluation 05/06/21    Authorization Type [employer] Medicare    SLP Start Time 1534    SLP Stop Time  1619    SLP Time Calculation (min) 45 min    Activity Tolerance Patient tolerated treatment well             Past Medical History:  Diagnosis Date   Chicken pox    Hypertension    Hyperthyroidism    25 years ago    Past Surgical History:  Procedure Laterality Date   APPLICATION OF CRANIAL NAVIGATION Left 02/20/2021   Procedure: APPLICATION OF CRANIAL NAVIGATION;  Surgeon: Consuella Lose, MD;  Location: [facility];  Service: Neurosurgery;  Laterality: Left;   CRANIOTOMY Left 02/20/2021   Procedure: Stereotactic LEFT pterional craniotomy for resection of meningioma;  Surgeon: Consuella Lose, MD;  Location: [facility];  Service: Neurosurgery;  Laterality: Left;   TONSILLECTOMY  1983    There were no vitals filed for this visit.   Subjective Assessment - 03/23/21 1534     Subjective "I did my homework"    Patient is accompained by: Family member   husband, Tim                  ADULT SLP TREATMENT - 03/23/21 1529       General Information   Behavior/Cognition Alert;Cooperative;Pleasant mood      Treatment Provided   Treatment provided Cognitive-Linquistic      Cognitive-Linquistic Treatment   Treatment focused on Aphasia;Patient/family/caregiver education    Skilled Treatment Pt reported difficulty completing HEP targeting word finding of associated words. SLP provided modeling and usual fading to occasional  questioning cues to ID 3-6 additional associated words. SLP reviewed with patient and husband appropriate cueing hierarchy and suggestions to maximize independent cuing (visuals, etcs). Pt benefited from additional processing time and occasional phonemic cues for dysnomia x3 this session.      Assessment / Recommendations / Plan   Plan Continue with current plan of care      Progression Toward Goals   Progression toward goals Progressing toward goals              SLP Education - 03/23/21 1621     Education Details cueing hierarchy, naming strategies    Person(s) Educated Patient;Spouse    Methods Explanation;Demonstration;Handout    Comprehension Verbalized understanding;Returned demonstration;Need further instruction              SLP Short Term Goals - 03/23/21 1529       SLP SHORT TERM GOAL #1   Title Pt will complete aphasia HEP with occasional min A over 2 sessions    Baseline 03-23-21    Time 3    Period Weeks    Status On-going      SLP SHORT TERM GOAL #2   Title Pt will verbally ID and correct word finding in paragraph level reading or picture description tasks with min A over 2 sessions    Time 3    Period Weeks    Status On-going  SLP SHORT TERM GOAL #3   Title Pt will use word finding compensations in 10 minute mod complex conversation with occasional min A over 2 sessions    Time 3    Period Weeks    Status On-going      SLP SHORT TERM GOAL #4   Title Pt will answer (yes/no/WH-) questions regarding paragraph length information with occasional cues over 2 sessions    Time 3    Period Weeks    Status On-going      SLP SHORT TERM GOAL #5   Title Pt will complete communication PROM in first 1-2 sessions    Baseline Communicative Participation Item Bank= 6    Status Achieved              SLP Long Term Goals - 03/23/21 1529       SLP LONG TERM GOAL #1   Title Pt will complete aphasia HEP with rare min A over 2 sessions    Time 7     Period Weeks    Status On-going      SLP LONG TERM GOAL #2   Title Pt will use word finding compensations in 15+ minute mod complex conversation with rare min A over 2 sessions    Time 7    Period Weeks    Status On-going      SLP LONG TERM GOAL #3   Title Pt will demonstrate comprehension of multi-paragraph length information with 80% accuracy over 2 sessions    Time 7    Period Weeks    Status On-going      SLP LONG TERM GOAL #4   Title Pt will report improved communication effectiveness via communication PROM by last ST session    Time 7    Period Weeks    Status On-going              Plan - 03/23/21 1622     Clinical Impression Statement Barbara Mola "Bett" presents with mild expressive and receptive aphasia secondary to resection of meingioma. Surgery completed 02-20-21. Ongoing education and training completed with patient and husband to maximize word finding via targeted tasks and auditory/written comprehension. Usual to occasional questioning cues required to identify additional associated words. See "skilled treatment" for additional details. Skilled ST is warranted to address mild aphasia impacting pt's communication effectiveness and to facilitate with return to PLOF.    Speech Therapy Frequency 2x / week    Duration 8 weeks    Treatment/Interventions Functional tasks;Patient/family education;Language facilitation;Compensatory techniques;Internal/external aids;SLP instruction and feedback;Multimodal communcation approach;Compensatory strategies    Potential to Achieve Goals Good    SLP Home Exercise Plan provided    Consulted and Agree with Plan of Care Patient;Family member/caregiver    Family Member Consulted husband, Tim             Patient will benefit from skilled therapeutic intervention in order to improve the following deficits and impairments:   Aphasia    Problem List Patient Active Problem List   Diagnosis Date Noted   Brain tumor [facility]) 02/20/2021    Essential hypertension, benign 03/25/2014    Alinda Deem, MA CCC-SLP 03/23/2021, 4:24 PM  [facility] [address] [place], [place], [address] Phone: [phone]   Fax:  [phone]   Name: Barbara Kim MRN: [id number] Date of Birth: 1955/10/19

## 2021-03-23 NOTE — Therapy (Signed)
[facility] [address] [place], [place], [address] Phone: [phone]   Fax:  [phone]  Physical Therapy Treatment  Patient Details  Name: Barbara Kim MRN: [id number] Date of Birth: [birth year] Referring Provider (PT): Kathyrn Sheriff   Encounter Date: 03/23/2021   PT End of Session - 03/23/21 1404     Visit Number 4    Number of Visits 17    Date for PT Re-Evaluation 05/08/21    Authorization Type [employer] [place] required (Codes [phone] and [phone] not covered)    PT Start Time 1404    PT Stop Time 1444    PT Time Calculation (min) 40 min    Equipment Utilized During Treatment Gait belt    Activity Tolerance Patient tolerated treatment well    Behavior During Therapy WFL for tasks assessed/performed             Past Medical History:  Diagnosis Date   Chicken pox    Hypertension    Hyperthyroidism    25 years ago    Past Surgical History:  Procedure Laterality Date   APPLICATION OF CRANIAL NAVIGATION Left 02/20/2021   Procedure: APPLICATION OF CRANIAL NAVIGATION;  Surgeon: Consuella Lose, MD;  Location: [place];  Service: Neurosurgery;  Laterality: Left;   CRANIOTOMY Left 02/20/2021   Procedure: Stereotactic LEFT pterional craniotomy for resection of meningioma;  Surgeon: Consuella Lose, MD;  Location: [facility];  Service: Neurosurgery;  Laterality: Left;   TONSILLECTOMY  1983    There were no vitals filed for this visit.   Subjective Assessment - 03/23/21 1404     Subjective Doing my exercises; my daughter has been helping.  No changes.    Patient is accompained by: Family member   husband   Pertinent History hyperthyroidism, HTN    Patient Stated Goals Pt's goals for physical therapy are to improve balance.    Currently in Pain? No/denies                          Access Code: [email or web address] URL: [email or web address] Date: 03/23/2021 Prepared by: Mady Haagensen  Exercises-Reviewed new additions to HEP from last visit, with pt return demo understanding.    Standing Balance in Corner - 1 x daily - 5 x weekly - 1 sets - 10 reps Head turns/head nods Standing Balance in Corner with Eyes Closed - 1 x daily - 5 x weekly - 1 sets - 10 reps Head turns/head nods            Balance Exercises - 03/23/21 0001       Balance Exercises: Standing   Standing Eyes Opened Narrow base of support (BOS);Head turns;5 reps;Limitations;Foam/compliant surface    Standing Eyes Opened Limitations Head turns/head nods    Standing Eyes Closed Wide (BOA);Narrow base of support (BOS);5 reps;Limitations;Foam/compliant surface    Standing Eyes Closed Limitations Head turns/head turns.  Cues to hold light support at chair.    SLS with Vectors Foam/compliant surface;Upper extremity assist 2;Limitations    SLS with Vectors Limitations RLE as stance leg, then LLE foot staps to 12" step, then into runner's stretch position, x 12 reps; then switch legs.    Step Ups Forward;Lateral;6 inch;UE support 2;Limitations    Step Ups Limitations Forward step up/up, down/down x 10, then single limb step ups x 10 reps.    Partial Tandem Stance Eyes open;Eyes closed;Upper extremity support 2;5 reps;Limitations    Partial Tandem Stance Limitations Head turns, head  nods x 5 EO, EC head steady x 15 sec    Sidestepping Upper extremity support;5 reps;Limitations    Sidestepping Limitations Resisted sidesteps x 10 ft R and L, 2 reps, then sidestep squats 10 ft, R and L x 3 reps.  Green theraband    Marching Foam/compliant surface;Intermittent upper extremity assist;Dynamic;Forwards;Retro;Limitations    Marching Limitations 2 reps on red/blue mat surfaces    Other Standing Exercises Wave squats, with squat>up on toes x 10 reps, 2 sets.    Other Standing Exercises Comments On red/blue mat surfaces:  tandem gait forward/back x 2 reps, heel walking/toe walking forward 4 reps, counter  support intermittently.  Performed mat exercises without shoes, as pt is wearing Croc-type heeled shoe, and has increased medial/lateral play.               PT Education - 03/23/21 1551     Education Details Continue doing current HEP    Person(s) Educated Patient;Spouse    Methods Explanation    Comprehension Verbalized understanding              PT Short Term Goals - 03/12/21 1541       PT SHORT TERM GOAL #1   Title Pt will be independent with HEP for improved strength, balance, transfers, and gait.  TARGET 04/10/21    Time 4    Period Weeks    Status New      PT SHORT TERM GOAL #2   Title Pt will improve FGA score to at least 22/30 to decrease fall risk    Baseline 19/30    Time 4    Period Weeks    Status New      PT SHORT TERM GOAL #3   Title Pt will improve SLS LLE to 10 seconds for improved overall balance and stability.    Baseline 7.9 sec    Time 4    Period Weeks    Status New               PT Long Term Goals - 03/12/21 1542       PT LONG TERM GOAL #1   Title Pt will be independent with HEP for improved strength, balance, transfers, and gait.  TARGET 05/08/2021    Time 8    Period Weeks    Status New      PT LONG TERM GOAL #2   Title Pt will improve FGA score to at least 25/30 to decrease fall risk.    Time 8    Period Weeks    Status New      PT LONG TERM GOAL #3   Title Pt will improve 6 MWT to at least 1500 ft for improved gait efficiency and endurance.    Baseline 1319 ft    Time 8    Period Weeks    Status New      PT LONG TERM GOAL #4   Title Pt will verbalize plans for return to community fitness upon d/c from PT.    Time 8    Period Weeks    Status New                   Plan - 03/23/21 1551     Clinical Impression Statement Focused skilled PT session today on compliant surface gait activities and NMR with weightbearing/funcitonal strengthening.  Pt has some increased sway through ankles on red/blue mat  compliant surface activities (worked without shoes due to pt's heeled shoes  making it more difficult for balance activities).  She relies on UE support for some compliant surface activities, and will continue to benefit from skilled PT to further address balance, gait, strength for improved overall functional mobility.    Personal Factors and Comorbidities Comorbidity 2    Comorbidities PMH: hyperthyroidism, HTN    Examination-Activity Limitations Locomotion Level;Stand    Examination-Participation Restrictions Community Activity;Occupation   Insurance account manager Stable/Uncomplicated    Rehab Potential Good    PT Frequency 2x / week    PT Duration 8 weeks   plus eval   PT Treatment/Interventions ADLs/Self Care Home Management;Gait training;Functional mobility training;Stair training;Therapeutic activities;Therapeutic exercise;Balance training;Neuromuscular re-education;Patient/family education    PT Next Visit Plan Continue to work on SLS balance, core and hip stability, dynamic balance, compliant surfaces; vestibular training/corner balance exercise progression    Consulted and Agree with Plan of Care Patient;Family member/caregiver    Family Member Consulted Husband             Patient will benefit from skilled therapeutic intervention in order to improve the following deficits and impairments:  Abnormal gait, Difficulty walking, Decreased endurance, Decreased balance, Decreased mobility  Visit Diagnosis: Unsteadiness on feet  Muscle weakness (generalized)     Problem List Patient Active Problem List   Diagnosis Date Noted   Brain tumor ([place]) 02/20/2021   Essential hypertension, benign 03/25/2014    Devika Dragovich W. 03/23/2021, 3:55 PM Frazier Butt., PT   [facility] [address] [place], [place], [address] Phone: [phone]   Fax:  [phone]  Name: Dajane Valli MRN:  [id number] Date of Birth: Apr 25, 1956

## 2021-03-23 NOTE — Therapy (Signed)
Barbara Kim [address] [place], [place], [address] Phone: [phone]   Fax:  [phone]  Occupational Therapy Treatment  Patient Details  Name: Barbara Kim MRN: [id number] Date of Birth: 1956-07-15 Referring Provider (OT): Sherron Ales   Encounter Date: 03/23/2021   OT End of Session - 03/23/21 1446     Visit Number 3    Number of Visits 17   8 weeks plan of care - anticipate discharge earlier   Date for OT Re-Evaluation 05/07/21    Authorization Type A[employer]na Medicare    Authorization Time Period $35 copay per day    Authorization - Visit Number 2    Authorization - Number of Visits 10    OT Start Time 1445    OT Stop Time 1530    OT Time Calculation (min) 45 min    Activity Tolerance Patient tolerated treatment well    Behavior During Therapy [facility] for tasks assessed/performed             Past Medical History:  Diagnosis Date   Chicken pox    Hypertension    Hyperthyroidism    25 years ago    Past Surgical History:  Procedure Laterality Date   APPLICATION OF CRANIAL NAVIGATION Left 02/20/2021   Procedure: APPLICATION OF CRANIAL NAVIGATION;  Surgeon: Consuella Lose, MD;  Location: [facility];  Service: Neurosurgery;  Laterality: Left;   CRANIOTOMY Left 02/20/2021   Procedure: Stereotactic LEFT pterional craniotomy for resection of meningioma;  Surgeon: Consuella Lose, MD;  Location: [facility];  Service: Neurosurgery;  Laterality: Left;   TONSILLECTOMY  1983    There were no vitals filed for this visit.      Environmental Scanning with 13/15 accuracy = 87% on first pass. Pt required min cues for locating remaining items on second pass. Pt got distracted by infoposters on wall during pass and starting talking about the posters.   Puzzle Patterns/Tanagrams level 1-6 with mod cues for attention to details  Constant Therapy Same Symbol level 8 with 100% accuracy and 34.27s response time.  Alternating Symbol level 6 with 95% accuracy and 97.07s response time                     OT Short Term Goals - 03/23/21 1514       OT SHORT TERM GOAL #1   Title Pt/caregiver will verbalize understanding of memory compensation strategies    Time 4    Period Weeks    Status New    Target Date 04/10/21      OT SHORT TERM GOAL #2   Title Pt will attend to a novel cognitive task for 5 minutes with no cue for redirection    Time 4    Period Weeks    Status On-going   5 minutes on CT 03/23/2021     OT SHORT TERM GOAL #3   Title Pt will be independent with diplopia HEP    Time 4    Period Weeks    Status On-going      OT SHORT TERM GOAL #4   Title Pt will complete environmental scanning with 90% accuracy or greater    Time 4    Period Weeks    Status On-going   87% on 03/23/21              OT Long Term Goals - 03/13/21 1135       OT LONG TERM GOAL #1   Title  Pt will report significant decrease in diplopia with watching TV and reading and other daily activities.    Time 8    Period Weeks    Status New    Target Date 05/08/21      OT LONG TERM GOAL #2   Title Pt will perform physical and cognitive task simultaneously with 90% accuracy or greater    Time 8    Period Weeks    Status New      OT LONG TERM GOAL #3   Title Pt will plan and execute a meal for her spouse and herself with supervision and good safety awareness    Time 8    Period Weeks    Status New      OT LONG TERM GOAL #4   Title Pt will verbalize understanding of cognitive and adapted strategies and equipment PRN for increased safety and independence with ADLs and IADLs.    Time 8    Period Weeks    Status New                   Plan - 03/23/21 1520     Clinical Impression Statement Pt with increased attention to task this day but continues to req min redirection and cues.    OT Occupational Profile and History Problem Focused Assessment - Including review of records  relating to presenting problem    Occupational performance deficits (Please refer to evaluation for details): IADL's;ADL's;Leisure;Work    Marketing executive / Function / Physical Skills ADL;Decreased knowledge of use of DME;FMC;Vision;IADL;ROM;UE functional use;GMC;Balance;Strength    Cognitive Skills Attention;Safety Awareness;Problem Solve    Rehab Potential Good    Clinical Decision Making Limited treatment options, no task modification necessary    Comorbidities Affecting Occupational Performance: None    Modification or Assistance to Complete Evaluation  No modification of tasks or assist necessary to complete eval    OT Frequency 2x / week    OT Duration 8 weeks   plan of care for 8 week - anticipate d/c earlier.   OT Treatment/Interventions Self-care/ADL training;Energy conservation;Patient/family education;Visual/perceptual remediation/compensation;Functional Mobility Training;Neuromuscular education;Therapeutic exercise;Cognitive remediation/compensation;Therapeutic activities;DME and/or AE instruction    Plan continue towards unmet goals, attention tasks (alternating), environmental scanning             Patient will benefit from skilled therapeutic intervention in order to improve the following deficits and impairments:   Body Structure / Function / Physical Skills: ADL, Decreased knowledge of use of DME, FMC, Vision, IADL, ROM, UE functional use, GMC, Balance, Strength Cognitive Skills: Attention, Safety Awareness, Problem Solve     Visit Diagnosis: Unsteadiness on feet  Visuospatial deficit  Attention and concentration deficit  Muscle weakness (generalized)  Frontal lobe and executive function deficit    Problem List Patient Active Problem List   Diagnosis Date Noted   Brain tumor ([place]) 02/20/2021   Essential hypertension, benign 03/25/2014    Zachery Conch MOT, OTR/L  03/23/2021, 4:01 PM  [facility] [address] [place], [place], [address] Phone: [phone]   Fax:  [phone]  Name: Barbara Kim MRN: [id number] Date of Birth: August 28, 1956

## 2021-03-25 ENCOUNTER — Other Ambulatory Visit: Payer: Self-pay

## 2021-03-25 ENCOUNTER — Encounter: Payer: Self-pay | Admitting: Occupational Therapy

## 2021-03-25 ENCOUNTER — Ambulatory Visit: Payer: Medicare HMO | Admitting: Physical Therapy

## 2021-03-25 ENCOUNTER — Ambulatory Visit: Payer: Medicare HMO | Admitting: Occupational Therapy

## 2021-03-25 ENCOUNTER — Ambulatory Visit: Payer: Medicare HMO

## 2021-03-25 DIAGNOSIS — R41844 Frontal lobe and executive function deficit: Secondary | ICD-10-CM

## 2021-03-25 DIAGNOSIS — R41842 Visuospatial deficit: Secondary | ICD-10-CM | POA: Diagnosis not present

## 2021-03-25 DIAGNOSIS — R41841 Cognitive communication deficit: Secondary | ICD-10-CM | POA: Diagnosis not present

## 2021-03-25 DIAGNOSIS — M6281 Muscle weakness (generalized): Secondary | ICD-10-CM

## 2021-03-25 DIAGNOSIS — R4701 Aphasia: Secondary | ICD-10-CM

## 2021-03-25 DIAGNOSIS — R2689 Other abnormalities of gait and mobility: Secondary | ICD-10-CM

## 2021-03-25 DIAGNOSIS — R2681 Unsteadiness on feet: Secondary | ICD-10-CM

## 2021-03-25 DIAGNOSIS — R41840 Attention and concentration deficit: Secondary | ICD-10-CM | POA: Diagnosis not present

## 2021-03-25 NOTE — Patient Instructions (Signed)
  Research background of [employer] (where did the name come from)          Read [employer] article about brain and provide a summary next ST session

## 2021-03-25 NOTE — Therapy (Signed)
[facility] [address] [place], [place], [address] Phone: [phone]   Fax:  [phone]  Occupational Therapy Treatment  Patient Details  Name: Barbara Kim MRN: [id number] Date of Birth: 01-Jun-1956 Referring Provider (OT): Sherron Ales   Encounter Date: 03/25/2021   OT End of Session - 03/25/21 1745     Visit Number 4    Number of Visits 17   8 weeks plan of care - anticipate discharge earlier   Date for OT Re-Evaluation 05/07/21    Authorization Type [employer]na Medicare    Authorization Time Period $35 copay per day    Authorization - Visit Number 2    Authorization - Number of Visits 10    OT Start Time 1742    OT Stop Time 1826    OT Time Calculation (min) 44 min    Activity Tolerance Patient tolerated treatment well    Behavior During Therapy [facility] for tasks assessed/performed             Past Medical History:  Diagnosis Date   Chicken pox    Hypertension    Hyperthyroidism    25 years ago    Past Surgical History:  Procedure Laterality Date   APPLICATION OF CRANIAL NAVIGATION Left 02/20/2021   Procedure: APPLICATION OF CRANIAL NAVIGATION;  Surgeon: Consuella Lose, MD;  Location: [facility];  Service: Neurosurgery;  Laterality: Left;   CRANIOTOMY Left 02/20/2021   Procedure: Stereotactic LEFT pterional craniotomy for resection of meningioma;  Surgeon: Consuella Lose, MD;  Location: [facility];  Service: Neurosurgery;  Laterality: Left;   TONSILLECTOMY  1983    There were no vitals filed for this visit.   Subjective Assessment - 03/25/21 1743     Subjective  Earlier when I was thinking so hard I had a little pain.    Patient is accompanied by: Family member   daughter, [name]   Pertinent History PMH: hyperthyroidism, HTN    Limitations Fall Risk. No driving.    Patient Stated Goals "go back and enjoy reading again"    Currently in Pain? No/denies              Functional Problem  Solving - organizing your day (complex 90*) with max cues for getting started and organizing tasks. Pt completed with 1 item forgotten in list but did well sequencing items.   Logic Links puzzles 19, 21 (no assistance req'd) 43, 44 (increased time and min A) for problem solving, sequencing and mental flexibility.                     OT Short Term Goals - 03/25/21 1746       OT SHORT TERM GOAL #1   Title Pt/caregiver will verbalize understanding of memory compensation strategies    Time 4    Period Weeks    Status New    Target Date 04/10/21      OT SHORT TERM GOAL #2   Title Pt will attend to a novel cognitive task for 5 minutes with no cue for redirection    Time 4    Period Weeks    Status Achieved      OT SHORT TERM GOAL #3   Title Pt will be independent with diplopia HEP    Time 4    Period Weeks    Status Achieved      OT SHORT TERM GOAL #4   Title Pt will complete environmental scanning with 90% accuracy or  greater    Time 4    Period Weeks    Status On-going   87% on 03/23/21              OT Long Term Goals - 03/25/21 1747       OT LONG TERM GOAL #1   Title Pt will report significant decrease in diplopia with watching TV and reading and other daily activities.    Time 8    Period Weeks    Status Achieved   only reports it at night/end of the day. 03/25/21     OT LONG TERM GOAL #2   Title Pt will perform physical and cognitive task simultaneously with 90% accuracy or greater    Time 8    Period Weeks    Status New      OT LONG TERM GOAL #3   Title Pt will plan and execute a meal for her spouse and herself with supervision and good safety awareness    Time 8    Period Weeks    Status New      OT LONG TERM GOAL #4   Title Pt will verbalize understanding of cognitive and adapted strategies and equipment PRN for increased safety and independence with ADLs and IADLs.    Time 8    Period Weeks    Status New                    Plan - 03/25/21 1820     Clinical Impression Statement Pt with good attention to tasks this day and with good problem solving h/e still some difficulty with problem solving.    OT Occupational Profile and History Problem Focused Assessment - Including review of records relating to presenting problem    Occupational performance deficits (Please refer to evaluation for details): IADL's;ADL's;Leisure;Work    Marketing executive / Function / Physical Skills ADL;Decreased knowledge of use of DME;FMC;Vision;IADL;ROM;UE functional use;GMC;Balance;Strength    Cognitive Skills Attention;Safety Awareness;Problem Solve    Rehab Potential Good    Clinical Decision Making Limited treatment options, no task modification necessary    Comorbidities Affecting Occupational Performance: None    Modification or Assistance to Complete Evaluation  No modification of tasks or assist necessary to complete eval    OT Frequency 2x / week    OT Duration 8 weeks   plan of care for 8 week - anticipate d/c earlier.   OT Treatment/Interventions Self-care/ADL training;Energy conservation;Patient/family education;Visual/perceptual remediation/compensation;Functional Mobility Training;Neuromuscular education;Therapeutic exercise;Cognitive remediation/compensation;Therapeutic activities;DME and/or AE instruction    Plan continue towards unmet goals, attention tasks (alternating), environmental scanning             Patient will benefit from skilled therapeutic intervention in order to improve the following deficits and impairments:   Body Structure / Function / Physical Skills: ADL, Decreased knowledge of use of DME, FMC, Vision, IADL, ROM, UE functional use, GMC, Balance, Strength Cognitive Skills: Attention, Safety Awareness, Problem Solve     Visit Diagnosis: Unsteadiness on feet  Visuospatial deficit  Muscle weakness (generalized)  Frontal lobe and executive function deficit  Attention and concentration  deficit    Problem List Patient Active Problem List   Diagnosis Date Noted   Brain tumor ([place]) 02/20/2021   Essential hypertension, benign 03/25/2014    Zachery Conch MOT, OTR/L  03/25/2021, 6:21 PM  [facility] [address] [place], [place], [address] Phone: [phone]   Fax:  [phone]  Name: Barbara Kim  MRN: [id number] Date of Birth: [birth year]

## 2021-03-25 NOTE — Therapy (Signed)
[facility] [address] [place], [place], [address] Phone: [phone]   Fax:  [phone]  Physical Therapy Treatment  Patient Details  Name: Barbara Kim MRN: [id number] Date of Birth: Jan 01, 1956 Referring Provider (PT): Kathyrn Sheriff   Encounter Date: 03/25/2021   PT End of Session - 03/25/21 1537     Visit Number 5    Number of Visits 17    Date for PT Re-Evaluation 05/08/21    Authorization Type [employer]na [place] required (Codes [phone] and [id number] not covered)    PT Start Time 1537   pt arrived and signed in after 1530   PT Stop Time 1616    PT Time Calculation (min) 39 min    Equipment Utilized During Treatment Gait belt    Activity Tolerance Patient tolerated treatment well    Behavior During Therapy WFL for tasks assessed/performed             Past Medical History:  Diagnosis Date   Chicken pox    Hypertension    Hyperthyroidism    25 years ago    Past Surgical History:  Procedure Laterality Date   APPLICATION OF CRANIAL NAVIGATION Left 02/20/2021   Procedure: APPLICATION OF CRANIAL NAVIGATION;  Surgeon: Consuella Lose, MD;  Location: [facility];  Service: Neurosurgery;  Laterality: Left;   CRANIOTOMY Left 02/20/2021   Procedure: Stereotactic LEFT pterional craniotomy for resection of meningioma;  Surgeon: Consuella Lose, MD;  Location: [facility];  Service: Neurosurgery;  Laterality: Left;   TONSILLECTOMY  1983    There were no vitals filed for this visit.   Subjective Assessment - 03/25/21 1537     Subjective No changes since last visit.  Having a headache sometimes, when I feel like I've done too much.    Patient is accompained by: Family member   husband   Pertinent History hyperthyroidism, HTN    Patient Stated Goals Pt's goals for physical therapy are to improve balance.    Currently in Pain? No/denies                               [facility] Adult PT Treatment/Exercise -  03/25/21 0001       Ambulation/Gait   Ambulation/Gait Yes    Ambulation/Gait Assistance 5: Supervision    Ambulation/Gait Assistance Details TActile cues for relaxed shoulders for improved relaxed arm swing    Ambulation Distance (Feet) 460 Feet    Assistive device None    Gait Pattern Step-through pattern;Decreased arm swing - right;Decreased arm swing - left;Wide base of support    Ambulation Surface Level;Indoor      Posture/Postural Control   Posture Comments Shoulder circles x 5 reps, 2 sets, cues for reminders to relax/depress shoulders                 Balance Exercises - 03/25/21 0001       Balance Exercises: Standing   Standing Eyes Opened Narrow base of support (BOS);Head turns;5 reps;Limitations;Foam/compliant surface;Wide (BOA)    Standing Eyes Opened Limitations Head turns/head nods    Standing Eyes Closed Wide (BOA);Narrow base of support (BOS);5 reps;Limitations;Foam/compliant surface    Standing Eyes Closed Limitations Head turns/head turns.  Cues to hold light support at chair.    SLS with Vectors Foam/compliant surface;Upper extremity assist 2;Limitations    SLS with Vectors Limitations RLE as stance leg, alt step taps to 12" step, then LLE foot staps to 12" step, then  into runner's stretch position, x 12 reps; then switch legs.    Stepping Strategy Anterior;Posterior;Lateral;Foam/compliant surface;10 reps;Limitations;UE support    Stepping Strategy Limitations On Airex    Step Ups Forward;Lateral;6 inch;Limitations    Step Ups Limitations Forward step up/up, down/down x 10, then single limb step ups x 15 reps. Lateral step ups x 15. no UE support today    Tandem Gait Forward;Retro;3 reps;Limitations    Tandem Gait Limitations On balance beam, progress to tandem gait, with lateral step taps and return to beam, forward and back, 2 reps.    Partial Tandem Stance Eyes open;Eyes closed;Upper extremity support 2;5 reps;Limitations    Partial Tandem Stance  Limitations Head turns, head nods x 5 EO, EC head steady x 15 sec    Sidestepping 5 reps;Limitations    Sidestepping Limitations Along balance beam, R and L, no UE support    Marching Foam/compliant surface;Intermittent upper extremity assist;Dynamic;Forwards;Retro;Limitations;Static;20 reps   Marching in place x 20 reps   Marching Limitations 2 reps on red/blue mat surfaces    Other Standing Exercises Standing in corner on pillows, trunk rotation, reaching across to wall, x 10 reps               PT Education - 03/25/21 1811     Education Details Additions to HEP-see instructions (pt not given printed handouts due to time; PT wrote on pt's current instructions)    Person(s) Educated Patient;Child(ren)    Methods Explanation;Demonstration;Handout    Comprehension Verbalized understanding;Returned demonstration              PT Short Term Goals - 03/12/21 1541       PT SHORT TERM GOAL #1   Title Pt will be independent with HEP for improved strength, balance, transfers, and gait.  TARGET 04/10/21    Time 4    Period Weeks    Status New      PT SHORT TERM GOAL #2   Title Pt will improve FGA score to at least 22/30 to decrease fall risk    Baseline 19/30    Time 4    Period Weeks    Status New      PT SHORT TERM GOAL #3   Title Pt will improve SLS LLE to 10 seconds for improved overall balance and stability.    Baseline 7.9 sec    Time 4    Period Weeks    Status New               PT Long Term Goals - 03/12/21 1542       PT LONG TERM GOAL #1   Title Pt will be independent with HEP for improved strength, balance, transfers, and gait.  TARGET 05/08/2021    Time 8    Period Weeks    Status New      PT LONG TERM GOAL #2   Title Pt will improve FGA score to at least 25/30 to decrease fall risk.    Time 8    Period Weeks    Status New      PT LONG TERM GOAL #3   Title Pt will improve 6 MWT to at least 1500 ft for improved gait efficiency and endurance.     Baseline 1319 ft    Time 8    Period Weeks    Status New      PT LONG TERM GOAL #4   Title Pt will verbalize plans for return to community fitness upon d/c  from PT.    Time 8    Period Weeks    Status New                   Plan - 03/25/21 1812     Clinical Impression Statement Continued dynamic balance and SLS balance activities on compliant surfaces today.  Pt progressing to performing >50% of exercises without UE support.  Updated HEP for narrowed BOS on compliant surface.  She will continue to benefit from skilled PT towards goals for improved overall functional mobility.    Personal Factors and Comorbidities Comorbidity 2    Comorbidities PMH: hyperthyroidism, HTN    Examination-Activity Limitations Locomotion Level;Stand    Examination-Participation Restrictions Community Activity;Occupation   Insurance account manager Stable/Uncomplicated    Rehab Potential Good    PT Frequency 2x / week    PT Duration 8 weeks   plus eval   PT Treatment/Interventions ADLs/Self Care Home Management;Gait training;Functional mobility training;Stair training;Therapeutic activities;Therapeutic exercise;Balance training;Neuromuscular re-education;Patient/family education    PT Next Visit Plan Review updates to HEP; Continue to work on SLS balance, core and hip stability, dynamic balance, compliant surfaces; vestibular training/corner balance exercise progression    Consulted and Agree with Plan of Care Patient;Family member/caregiver    Family Member Consulted daughter             Patient will benefit from skilled therapeutic intervention in order to improve the following deficits and impairments:  Abnormal gait, Difficulty walking, Decreased endurance, Decreased balance, Decreased mobility  Visit Diagnosis: Unsteadiness on feet  Other abnormalities of gait and mobility     Problem List Patient Active Problem List   Diagnosis Date Noted   Brain tumor ([facility])  02/20/2021   Essential hypertension, benign 03/25/2014    Frazier Butt. 03/25/2021, 6:14 PM Frazier Butt., PT  [facility] [address] [place], [place], [address] Phone: [phone]   Fax:  [phone]  Name: Barbara Kim MRN: [id number] Date of Birth: 08/26/1956

## 2021-03-25 NOTE — Patient Instructions (Signed)
Access Code: [email or web address] URL: [email or web address] Date: 03/25/2021 Prepared by: Mady Haagensen  Exercises Walking Tandem Stance - 1 x daily - 5 x weekly - 1 sets - 3 reps Sidestepping - 1 x daily - 5 x weekly - 1 sets - 3 reps Standing Balance in Corner - 1 x daily - 5 x weekly - 1 sets - 10 reps Standing Balance in Corner with Eyes Closed - 1 x daily - 5 x weekly - 1 sets - 10 reps  Added 03/25/21: Romberg Stance on Foam Pad - 1 x daily - 5 x weekly - 1 sets - 10 reps -eyes open head turns/nods x 10 -eyes closed head turns/nods x 10

## 2021-03-25 NOTE — Therapy (Signed)
[facility] [address] [place], [place], [address] Phone: [phone]   Fax:  [phone]  Speech Language Pathology Treatment  Patient Details  Name: Barbara Kim MRN: [id number] Date of Birth: 1956/09/02 Referring Provider (SLP): Consuella Lose, MD   Encounter Date: 03/25/2021   End of Session - 03/25/21 1802     Visit Number 5    Number of Visits 17    Date for SLP Re-Evaluation 05/06/21    Authorization Type [employer] Medicare    SLP Start Time 1617    SLP Stop Time  1700    SLP Time Calculation (min) 43 min    Activity Tolerance Patient tolerated treatment well             Past Medical History:  Diagnosis Date   Chicken pox    Hypertension    Hyperthyroidism    25 years ago    Past Surgical History:  Procedure Laterality Date   APPLICATION OF CRANIAL NAVIGATION Left 02/20/2021   Procedure: APPLICATION OF CRANIAL NAVIGATION;  Surgeon: Consuella Lose, MD;  Location: [facility];  Service: Neurosurgery;  Laterality: Left;   CRANIOTOMY Left 02/20/2021   Procedure: Stereotactic LEFT pterional craniotomy for resection of meningioma;  Surgeon: Consuella Lose, MD;  Location: [facility];  Service: Neurosurgery;  Laterality: Left;   TONSILLECTOMY  1983    There were no vitals filed for this visit.          ADULT SLP TREATMENT - 03/25/21 1617       General Information   Behavior/Cognition Alert;Cooperative;Pleasant mood      Treatment Provided   Treatment provided Cognitive-Linquistic      Cognitive-Linquistic Treatment   Treatment focused on Aphasia;Patient/family/caregiver education    Skilled Treatment Pt completed HEP as instructed with use of recommendations to aid word finding. SLP targeted divergent naming in categories with specific letter prompt. Usual additional processing time required, with intermittent mild verbal cues to name additional items. SLP targeted auditory comprehension of  short paragraph, in which pt able to answer [facility]- questions with 60% accuracy. Repetition of paragraph improved accuracy to 100% accuracy. Some reduced attention and receptive comprehension indicated (ex: pt verbalized she was thinking of a fox versus ox).      Assessment / Recommendations / Plan   Plan Continue with current plan of care      Progression Toward Goals   Progression toward goals Progressing toward goals              SLP Education - 03/25/21 1802     Education Details reading comprehension, naming HEP    Person(s) Educated Patient;Child(ren)    Methods Explanation;Demonstration;Handout    Comprehension Verbalized understanding;Returned demonstration;Need further instruction              SLP Short Term Goals - 03/25/21 1803       SLP SHORT TERM GOAL #1   Title Pt will complete aphasia HEP with occasional min A over 2 sessions    Baseline 03-23-21    Time 3    Period Weeks    Status On-going      SLP SHORT TERM GOAL #2   Title Pt will verbally ID and correct word finding in paragraph level reading or picture description tasks with min A over 2 sessions    Time 3    Period Weeks    Status On-going      SLP SHORT TERM GOAL #3   Title Pt will use word  finding compensations in 10 minute mod complex conversation with occasional min A over 2 sessions    Time 3    Period Weeks    Status On-going      SLP SHORT TERM GOAL #4   Title Pt will answer (yes/no/WH-) questions regarding paragraph length information with 80% accuracy given occasional cues over 2 sessions    Time 3    Period Weeks    Status Revised      SLP SHORT TERM GOAL #5   Title Pt will complete communication PROM in first 1-2 sessions    Baseline Communicative Participation Item Bank= 6    Status Achieved              SLP Long Term Goals - 03/25/21 1804       SLP LONG TERM GOAL #1   Title Pt will complete aphasia HEP with rare min A over 2 sessions    Time 7    Period Weeks     Status On-going      SLP LONG TERM GOAL #2   Title Pt will use word finding compensations in 15+ minute mod complex conversation with rare min A over 2 sessions    Time 7    Period Weeks    Status On-going      SLP LONG TERM GOAL #3   Title Pt will demonstrate comprehension of multi-paragraph length information with 80% accuracy over 2 sessions    Time 7    Period Weeks    Status On-going      SLP LONG TERM GOAL #4   Title Pt will report improved communication effectiveness via communication PROM by last ST session    Time 7    Period Weeks    Status On-going              Plan - 03/25/21 1804     Clinical Impression Statement Barbara Mola "Bett" presents with mild expressive and receptive aphasia secondary to resection of meingioma. Surgery completed 02-20-21. SLP targeted divergent naming and auditory comprehension of paragraph. Reduced comprehension impacted by receptive language skills and attention. SLP provided reading comprehension HEP. See "skilled treatment" for additional details. Skilled ST is warranted to address mild aphasia impacting pt's communication effectiveness and to facilitate with return to PLOF.    Speech Therapy Frequency 2x / week    Duration 8 weeks    Treatment/Interventions Functional tasks;Patient/family education;Language facilitation;Compensatory techniques;Internal/external aids;SLP instruction and feedback;Multimodal communcation approach;Compensatory strategies    Potential to Achieve Goals Good    SLP Home Exercise Plan provided    Consulted and Agree with Plan of Care Patient;Family member/caregiver    Family Member Consulted daughter             Patient will benefit from skilled therapeutic intervention in order to improve the following deficits and impairments:   Aphasia  Cognitive communication deficit    Problem List Patient Active Problem List   Diagnosis Date Noted   Brain tumor [facility]) 02/20/2021   Essential hypertension,  benign 03/25/2014    Alinda Deem, [place] CCC-SLP 03/25/2021, 6:05 PM  [facility] [address] [place], [place], [address] Phone: [phone]   Fax:  [phone]   Name: Barbara Kim MRN: [id number] Date of Birth: 22-May-1956

## 2021-04-01 ENCOUNTER — Ambulatory Visit: Payer: Medicare HMO | Admitting: Physical Therapy

## 2021-04-01 ENCOUNTER — Ambulatory Visit: Payer: Medicare HMO

## 2021-04-01 ENCOUNTER — Ambulatory Visit: Payer: Medicare HMO | Admitting: Occupational Therapy

## 2021-04-06 ENCOUNTER — Ambulatory Visit: Payer: Medicare HMO | Admitting: Physical Therapy

## 2021-04-06 ENCOUNTER — Encounter: Payer: Medicare HMO | Admitting: Occupational Therapy

## 2021-04-08 ENCOUNTER — Encounter: Payer: Medicare HMO | Admitting: Speech Pathology

## 2021-04-08 ENCOUNTER — Other Ambulatory Visit: Payer: Self-pay | Admitting: Family Medicine

## 2021-04-08 ENCOUNTER — Encounter: Payer: Medicare HMO | Admitting: Occupational Therapy

## 2021-04-08 ENCOUNTER — Ambulatory Visit: Payer: Medicare HMO | Admitting: Physical Therapy

## 2021-04-13 ENCOUNTER — Encounter: Payer: Medicare HMO | Admitting: Occupational Therapy

## 2021-04-13 ENCOUNTER — Ambulatory Visit: Payer: Medicare HMO | Admitting: Physical Therapy

## 2021-04-15 ENCOUNTER — Other Ambulatory Visit: Payer: Self-pay

## 2021-04-15 ENCOUNTER — Encounter: Payer: Self-pay | Admitting: Physical Therapy

## 2021-04-15 ENCOUNTER — Ambulatory Visit: Payer: Medicare HMO | Admitting: Occupational Therapy

## 2021-04-15 ENCOUNTER — Ambulatory Visit: Payer: Medicare HMO

## 2021-04-15 ENCOUNTER — Ambulatory Visit: Payer: Medicare HMO | Attending: Neurosurgery | Admitting: Physical Therapy

## 2021-04-15 DIAGNOSIS — R41844 Frontal lobe and executive function deficit: Secondary | ICD-10-CM | POA: Insufficient documentation

## 2021-04-15 DIAGNOSIS — R41842 Visuospatial deficit: Secondary | ICD-10-CM | POA: Insufficient documentation

## 2021-04-15 DIAGNOSIS — R41841 Cognitive communication deficit: Secondary | ICD-10-CM | POA: Insufficient documentation

## 2021-04-15 DIAGNOSIS — R2681 Unsteadiness on feet: Secondary | ICD-10-CM | POA: Insufficient documentation

## 2021-04-15 DIAGNOSIS — R41840 Attention and concentration deficit: Secondary | ICD-10-CM

## 2021-04-15 DIAGNOSIS — R4701 Aphasia: Secondary | ICD-10-CM | POA: Diagnosis present

## 2021-04-15 DIAGNOSIS — R2689 Other abnormalities of gait and mobility: Secondary | ICD-10-CM | POA: Insufficient documentation

## 2021-04-15 DIAGNOSIS — M6281 Muscle weakness (generalized): Secondary | ICD-10-CM | POA: Diagnosis present

## 2021-04-15 NOTE — Patient Instructions (Signed)
  Read those two articles and provide written summary on the back.    Write down any words you have trouble remembering or having trouble getting out.

## 2021-04-15 NOTE — Patient Instructions (Signed)

## 2021-04-15 NOTE — Therapy (Signed)
[facility] [address] [place], [place], [address] Phone: [phone]   Fax:  [phone]  Physical Therapy Treatment  Patient Details  Name: Barbara Kim MRN: [id number] Date of Birth: 02/06/1956 Referring Provider (PT): Kathyrn Sheriff   Encounter Date: 04/15/2021   PT End of Session - 04/15/21 1105     Visit Number 6    Number of Visits 17    Date for PT Re-Evaluation 05/08/21    Authorization Type [employer]na [place] required (Codes [id number] and [phone] not covered)    PT Start Time 1102    PT Stop Time 1142    PT Time Calculation (min) 40 min    Equipment Utilized During Treatment Gait belt    Activity Tolerance Patient tolerated treatment well    Behavior During Therapy WFL for tasks assessed/performed             Past Medical History:  Diagnosis Date   Chicken pox    Hypertension    Hyperthyroidism    25 years ago    Past Surgical History:  Procedure Laterality Date   APPLICATION OF CRANIAL NAVIGATION Left 02/20/2021   Procedure: APPLICATION OF CRANIAL NAVIGATION;  Surgeon: Consuella Lose, MD;  Location: [facility];  Service: Neurosurgery;  Laterality: Left;   CRANIOTOMY Left 02/20/2021   Procedure: Stereotactic LEFT pterional craniotomy for resection of meningioma;  Surgeon: Consuella Lose, MD;  Location: [facility];  Service: Neurosurgery;  Laterality: Left;   TONSILLECTOMY  1983    There were no vitals filed for this visit.   Subjective Assessment - 04/15/21 1102     Subjective No new complaitns. 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 [place] one and fell forward. Was not holding rails as she was afraid of germs. Landed on knees with left knee taking most of the impact. Also hit her right elbow. Has bruising, denies any other injuries. Was able to get her self up. No pain at all today. Asking about returing to yoga at home.    Pertinent History  hyperthyroidism, HTN    Patient Stated Goals Pt's goals for physical therapy are to improve balance.    Currently in Pain? No/denies    Pain Score 0-No pain                [facility] PT Assessment - 04/15/21 1108       Functional Gait  Assessment   Gait assessed  Yes    Gait Level Surface Walks 20 ft in less than 7 sec but greater than 5.5 sec, uses assistive device, slower speed, mild gait deviations, or deviates 6-10 in outside of the 12 in walkway width.   6.40 sec's   Change in Gait Speed Able to smoothly change walking speed without loss of balance or gait deviation. Deviate no more than 6 in outside of the 12 in walkway width.    Gait with Horizontal Head Turns Performs head turns smoothly with no change in gait. Deviates no more than 6 in outside 12 in walkway width    Gait with Vertical Head Turns Performs head turns with no change in gait. Deviates no more than 6 in outside 12 in walkway width.    Gait and Pivot Turn Pivot turns safely within 3 sec and stops quickly with no loss of balance.    Step Over Obstacle Is able to step over one shoe box (4.5 in total height) without changing gait speed.  No evidence of imbalance.    Gait with Narrow Base of Support Is able to ambulate for 10 steps heel to toe with no staggering.    Gait with Eyes Closed Walks 20 ft, uses assistive device, slower speed, mild gait deviations, deviates 6-10 in outside 12 in walkway width. Ambulates 20 ft in less than 9 sec but greater than 7 sec.   7.41 sec's   Ambulating Backwards Walks 20 ft, no assistive devices, good speed, no evidence for imbalance, normal gait   9.34 sec's   Steps Alternating feet, must use rail.    Total Score 26    FGA comment: 26/30 secored today                   [facility] Adult PT Treatment/Exercise - 04/15/21 1108       Transfers   Transfers Sit to Stand;Stand to Sit    Sit to Stand 6: Modified independent (Device/Increase time);Without upper extremity assist;From  chair/3-in-1    Stand to Sit 6: Modified independent (Device/Increase time);Without upper extremity assist;To chair/3-in-1      Ambulation/Gait   Ambulation/Gait Yes    Ambulation/Gait Assistance 5: Supervision    Ambulation/Gait Assistance Details around gym with session    Assistive device None    Gait Pattern Step-through pattern;Decreased arm swing - right;Decreased arm swing - left;Wide base of support    Ambulation Surface Level;Indoor      Self-Care   Self-Care Other Self-Care Comments    Other Self-Care Comments  discussed current HEP. Was able to do the HEP while away in [place]. Also did some while in the pool every day while there. No issues per pt with new ex's; after checking goals discussed gradual return to yoga at home. Use of stable surface such as chair back to assist with balance. Also discussed use of [employer] and [employer] to see and learn modifications to yoga poses to make them more adaptable to her current level of function.; also discussed pt's recent fall on stairs and that she should be using a rail for saftey at this time. Use of hand sanitizer can decrease germs she gets. Pt has some she carries in her purse.                 Balance Exercises - 04/15/21 1119       Balance Exercises: Standing   SLS Eyes open;Solid surface;3 reps;10 secs;Limitations    SLS Limitations no balance issues noted    Rockerboard Anterior/posterior;Lateral;EO;EC;30 seconds;Other reps (comment);Limitations    Rockerboard Limitations performed both ways on balance board with no UE support: rocking the board with emphasis on tall posture with EO, progressing to EC with min guard to min assist for balance; then holding the board steady for EC 30 sec's x 3 reps with min assist.    Sit to Stand Standard surface;Foam/compliant surface;Limitations    Sit to Stand Limitations seated with feet across red foam beam: sit<>stands with no UE support for 10 reps with min guard assisit, cues for full  standing posture and controlled descent.                 PT Short Term Goals - 04/15/21 1107       PT SHORT TERM GOAL #1   Title Pt will be independent with HEP for improved strength, balance, transfers, and gait.  TARGET 04/10/21    Baseline 04/15/21: met with current program    Status Achieved      PT SHORT  TERM GOAL #2   Title Pt will improve FGA score to at least 22/30 to decrease fall risk    Baseline 04/15/21: 26/30 scored today    Time --    Period --    Status Achieved      PT SHORT TERM GOAL #3   Title Pt will improve SLS LLE to 10 seconds for improved overall balance and stability.    Baseline 04/15/21: met in session today    Time --    Period --    Status Achieved               PT Long Term Goals - 04/15/21 1913       PT LONG TERM GOAL #1   Title Pt will be independent with HEP for improved strength, balance, transfers, and gait.  TARGET 05/08/2021    Time 8    Period Weeks    Status On-going      PT LONG TERM GOAL #2   Title Pt will improve FGA score to at least 25/30 to decrease fall risk.    Baseline 04/15/21: 26/30 scored in session today    Status Achieved      PT LONG TERM GOAL #3   Title Pt will improve 6 MWT to at least 1500 ft for improved gait efficiency and endurance.    Baseline 1319 ft    Time 8    Period Weeks    Status On-going      PT LONG TERM GOAL #4   Title Pt will verbalize plans for return to community fitness upon d/c from PT.    Time 8    Period Weeks    Status On-going                   Plan - 04/15/21 1106     Clinical Impression Statement Today's skilled session initially focused on progress toward STGs with pt meeting all goals. Pt improved her FGA to 26/30 which also met the LTG for this. Remainder of session continued to address balance training and LE strengthening with no issues noted or reported in session. The pt is making steady progress and should benefit from continued PT to progress toward unmet  goals.    Personal Factors and Comorbidities Comorbidity 2    Comorbidities PMH: hyperthyroidism, HTN    Examination-Activity Limitations Locomotion Level;Stand    Examination-Participation Restrictions Community Activity;Occupation   Insurance account manager Stable/Uncomplicated    Rehab Potential Good    PT Frequency 2x / week    PT Duration 8 weeks   plus eval   PT Treatment/Interventions ADLs/Self Care Home Management;Gait training;Functional mobility training;Stair training;Therapeutic activities;Therapeutic exercise;Balance training;Neuromuscular re-education;Patient/family education    PT Next Visit Plan Rev Continue to work on SLS balance, core and hip stability, dynamic balance, compliant surfaces; vestibular training/corner balance exercise progression    Consulted and Agree with Plan of Care Patient;Family member/caregiver    Family Member Consulted daughter             Patient will benefit from skilled therapeutic intervention in order to improve the following deficits and impairments:  Abnormal gait, Difficulty walking, Decreased endurance, Decreased balance, Decreased mobility  Visit Diagnosis: Unsteadiness on feet  Muscle weakness (generalized)  Other abnormalities of gait and mobility     Problem List Patient Active Problem List   Diagnosis Date Noted   Brain tumor ([facility]) 02/20/2021   Essential hypertension, benign 03/25/2014    Willow Ora, PTA,  [facility] [address] [place], [place] [address] [phone] 04/15/21, 7:14 PM   Name: Barbara Kim MRN: [id number] Date of Birth: Mar 30, 1956

## 2021-04-15 NOTE — Therapy (Signed)
[facility] [address] [place], [place], [address] Phone: [phone]   Fax:  [phone]  Occupational Therapy Treatment  Patient Details  Name: Barbara Kim MRN: [id number] Date of Birth: [birth year] Referring Provider (OT): Sherron Ales   Encounter Date: 04/15/2021   OT End of Session - 04/15/21 1149     Visit Number 5    Number of Visits 17   8 weeks plan of care - anticipate discharge earlier   Date for OT Re-Evaluation 05/07/21    Authorization Type [employer] Medicare    Authorization Time Period $35 copay per day    Authorization - Visit Number 3    Authorization - Number of Visits 10    OT Start Time 0930    OT Stop Time 1015    OT Time Calculation (min) 45 min    Activity Tolerance Patient tolerated treatment well    Behavior During Therapy [facility] for tasks assessed/performed             Past Medical History:  Diagnosis Date   Chicken pox    Hypertension    Hyperthyroidism    25 years ago    Past Surgical History:  Procedure Laterality Date   APPLICATION OF CRANIAL NAVIGATION Left 02/20/2021   Procedure: APPLICATION OF CRANIAL NAVIGATION;  Surgeon: Consuella Lose, MD;  Location: [facility];  Service: Neurosurgery;  Laterality: Left;   CRANIOTOMY Left 02/20/2021   Procedure: Stereotactic LEFT pterional craniotomy for resection of meningioma;  Surgeon: Consuella Lose, MD;  Location: [facility];  Service: Neurosurgery;  Laterality: Left;   TONSILLECTOMY  1983    There were no vitals filed for this visit.   Subjective Assessment - 04/15/21 0935     Subjective  I got cleared to fly by my neurologist and went to [place] to house sit for my daughter. My head is slowly getting better    Patient is accompanied by: Family member   daughter, [name]   Pertinent History PMH: hyperthyroidism, HTN    Limitations Fall Risk. No driving.    Patient Stated Goals "go back and enjoy reading again"    Currently  in Pain? No/denies             Pt issued memory strategies and reviewed including ways to implement at home.   Discussed fall off 2 steps while in [place] (partly d/t decreased depth perception from diplopia) and practiced with use of handrail and sensory feedback (feeling edge of step before stepping). Pt admitted she was not holding railing of step when she fell. Also encouraged slow progression of cooking at home with direct supervision.  Divided attention tasks b/t cognitive task and physical task: tossing ball alternating hands w/ only 1 drop entire time while performing category generation with min to mod questioning cues.                      OT Education - 04/15/21 0940     Education Details memory strategies    Person(s) Educated Patient    Methods Explanation;Handout    Comprehension Verbalized understanding              OT Short Term Goals - 04/15/21 1150       OT SHORT TERM GOAL #1   Title Pt/caregiver will verbalize understanding of memory compensation strategies    Time 4    Period Weeks    Status Achieved    Target Date 04/10/21  OT SHORT TERM GOAL #2   Title Pt will attend to a novel cognitive task for 5 minutes with no cue for redirection    Time 4    Period Weeks    Status Achieved      OT SHORT TERM GOAL #3   Title Pt will be independent with diplopia HEP    Time 4    Period Weeks    Status Achieved      OT SHORT TERM GOAL #4   Title Pt will complete environmental scanning with 90% accuracy or greater    Time 4    Period Weeks    Status On-going   87% on 03/23/21              OT Long Term Goals - 04/15/21 1150       OT LONG TERM GOAL #1   Title Pt will report significant decrease in diplopia with watching TV and reading and other daily activities.    Time 8    Period Weeks    Status Achieved   only reports it at night/end of the day. 03/25/21     OT LONG TERM GOAL #2   Title Pt will perform physical and  cognitive task simultaneously with 90% accuracy or greater    Time 8    Period Weeks    Status On-going   approx 80% accuracy (min to mod questioning cues/prompts for cognitive task)     OT LONG TERM GOAL #3   Title Pt will plan and execute a meal for her spouse and herself with supervision and good safety awareness    Time 8    Period Weeks    Status New      OT LONG TERM GOAL #4   Title Pt will verbalize understanding of cognitive and adapted strategies and equipment PRN for increased safety and independence with ADLs and IADLs.    Time 8    Period Weeks    Status New                   Plan - 04/15/21 1151     Clinical Impression Statement Pt returns today after 3 weeks as pt was out of town. Pt doing well overall but reported falling off step while in [place] and History Problem Focused Assessment - Including review of records relating to presenting problem    Occupational performance deficits (Please refer to evaluation for details): IADL's;ADL's;Leisure;Work    Marketing executive / Function / Physical Skills ADL;Decreased knowledge of use of DME;FMC;Vision;IADL;ROM;UE functional use;GMC;Balance;Strength    Cognitive Skills Attention;Safety Awareness;Problem Solve    Rehab Potential Good    Clinical Decision Making Limited treatment options, no task modification necessary    Comorbidities Affecting Occupational Performance: None    Modification or Assistance to Complete Evaluation  No modification of tasks or assist necessary to complete eval    OT Frequency 2x / week    OT Duration 8 weeks   plan of care for 8 week - anticipate d/c earlier.   OT Treatment/Interventions Self-care/ADL training;Energy conservation;Patient/family education;Visual/perceptual remediation/compensation;Functional Mobility Training;Neuromuscular education;Therapeutic exercise;Cognitive remediation/compensation;Therapeutic activities;DME and/or AE instruction    Plan  cooking task    Consulted and Agree with Plan of Care Patient             Patient will benefit from skilled therapeutic intervention in order to improve the following deficits and impairments:   Body Structure / Function / Physical Skills:  ADL, Decreased knowledge of use of DME, [place], Vision, IADL, ROM, UE functional use, GMC, Balance, Strength Cognitive Skills: Attention, Safety Awareness, Problem Solve     Visit Diagnosis: Frontal lobe and executive function deficit  Attention and concentration deficit    Problem List Patient Active Problem List   Diagnosis Date Noted   Brain tumor ([place]) 02/20/2021   Essential hypertension, benign 03/25/2014    Carey Bullocks, OTR/L 04/15/2021, 11:53 AM  [facility] [address] [place], [place], [address] Phone: [phone]   Fax:  [phone]  Name: Barbara Kim MRN: [id number] Date of Birth: 15-Jul-1956

## 2021-04-15 NOTE — Therapy (Signed)
[facility] [address] [place], [place], [address] Phone: [phone]   Fax:  [phone]  Speech Language Pathology Treatment  Patient Details  Name: Barbara Kim MRN: [id number] Date of Birth: 07-27-1956 Referring Provider (SLP): Consuella Lose, MD   Encounter Date: 04/15/2021   End of Session - 04/15/21 0959     Visit Number 6    Number of Visits 17    Date for SLP Re-Evaluation 05/06/21    Authorization Type A[employer]na Medicare    SLP Start Time 1015    SLP Stop Time  1100    SLP Time Calculation (min) 45 min    Activity Tolerance Patient tolerated treatment well             Past Medical History:  Diagnosis Date   Chicken pox    Hypertension    Hyperthyroidism    25 years ago    Past Surgical History:  Procedure Laterality Date   APPLICATION OF CRANIAL NAVIGATION Left 02/20/2021   Procedure: APPLICATION OF CRANIAL NAVIGATION;  Surgeon: Consuella Lose, MD;  Location: [facility];  Service: Neurosurgery;  Laterality: Left;   CRANIOTOMY Left 02/20/2021   Procedure: Stereotactic LEFT pterional craniotomy for resection of meningioma;  Surgeon: Consuella Lose, MD;  Location: [facility];  Service: Neurosurgery;  Laterality: Left;   TONSILLECTOMY  1983    There were no vitals filed for this visit.   Subjective Assessment - 04/15/21 1015     Subjective "I am learning to play chess"    Currently in Pain? No/denies                   ADULT SLP TREATMENT - 04/15/21 0959       General Information   Behavior/Cognition Alert;Cooperative;Pleasant mood      Treatment Provided   Treatment provided Cognitive-Linquistic      Cognitive-Linquistic Treatment   Treatment focused on Aphasia;Cognition;Patient/family/caregiver education    Skilled Treatment Pt reports improvements in anomia and rare episodes of forgetfullness (misplacing item). Pt completed HEP for reading comprehension, with good  accuracy demonstrated. SLP targeted  reading comprehension of multi-paragraph articles, in which pt able to verbally summarize and comprehend article with rare questioning cues. Anomia x1 exhibited in conversation, in which pt utilized synonym independently. SLP provided mod phonemic cues to ID targeted word. Mild tangential episodes noted this session, which may possibly be baseline.      Assessment / Recommendations / Plan   Plan Continue with current plan of care      Progression Toward Goals   Progression toward goals Progressing toward goals              SLP Education - 04/15/21 1102     Education Details reading comprehension, HEP    Person(s) Educated Patient    Methods Explanation;Demonstration;Handout    Comprehension Verbalized understanding;Returned demonstration;Need further instruction              SLP Short Term Goals - 04/15/21 1000       SLP SHORT TERM GOAL #1   Title Pt will complete aphasia HEP with occasional min A over 2 sessions    Baseline 03-23-21, 04-15-21    Time --    Period --    Status Achieved      SLP SHORT TERM GOAL #2   Title Pt will verbally ID and correct word finding in paragraph level reading or picture description tasks with min A over 2 sessions    Time  2    Period Weeks    Status On-going      SLP SHORT TERM GOAL #3   Title Pt will use word finding compensations in 10 minute mod complex conversation with occasional min A over 2 sessions    Baseline 04-15-21    Time 2    Period Weeks    Status On-going      SLP SHORT TERM GOAL #4   Title Pt will answer (yes/no/WH-) questions regarding paragraph length information with 80% accuracy given occasional cues over 2 sessions    Baseline 04-15-21    Time 2    Period Weeks    Status Revised      SLP SHORT TERM GOAL #5   Title Pt will complete communication PROM in first 1-2 sessions    Baseline Communicative Participation Item Bank= 6    Status Achieved              SLP Long  Term Goals - 04/15/21 1000       SLP LONG TERM GOAL #1   Title Pt will complete aphasia HEP with rare min A over 2 sessions    Time 6    Period Weeks    Status On-going      SLP LONG TERM GOAL #2   Title Pt will use word finding compensations in 15+ minute mod complex conversation with rare min A over 2 sessions    Time 6    Period Weeks    Status On-going      SLP LONG TERM GOAL #3   Title Pt will demonstrate comprehension of multi-paragraph length information with 80% accuracy over 2 sessions    Baseline 04-15-21    Time 6    Period Weeks    Status On-going      SLP LONG TERM GOAL #4   Title Pt will report improved communication effectiveness via communication PROM by last ST session    Time 6    Period Weeks    Status On-going              Plan - 04/15/21 1000     Clinical Impression Statement Barbara "Bett" presents with mild expressive and receptive aphasia secondary to resection of meingioma. Surgery completed 02-20-21. SLP targeted reading comprehension of short articles. Good comprehension and verbal summary of information demonstrated. Anomia x1 exhibited this session, in which pt utilize synonym strategy. SLP provided additional reading comprehension HEP. See "skilled treatment" for additional details. Skilled ST is warranted to address mild aphasia impacting pt's communication effectiveness and to facilitate with return to PLOF.    Speech Therapy Frequency 2x / week    Duration 8 weeks    Treatment/Interventions Functional tasks;Patient/family education;Language facilitation;Compensatory techniques;Internal/external aids;SLP instruction and feedback;Multimodal communcation approach;Compensatory strategies    Potential to Achieve Goals Good    SLP Home Exercise Plan provided    Consulted and Agree with Plan of Care Patient             Patient will benefit from skilled therapeutic intervention in order to improve the following deficits and impairments:    Aphasia  Cognitive communication deficit    Problem List Patient Active Problem List   Diagnosis Date Noted   Brain tumor ([place]) 02/20/2021   Essential hypertension, benign 03/25/2014    Alinda Deem, [place] CCC-SLP 04/15/2021, 11:06 AM  [facility] [address] [place], [place], [address] Phone: [phone]   Fax:  [phone]  Name: Barbara Kim MRN: [id number] Date of Birth: [birth year]

## 2021-04-20 ENCOUNTER — Ambulatory Visit: Payer: Medicare HMO

## 2021-04-20 ENCOUNTER — Encounter: Payer: Self-pay | Admitting: Occupational Therapy

## 2021-04-20 ENCOUNTER — Ambulatory Visit: Payer: Medicare HMO | Admitting: Physical Therapy

## 2021-04-20 ENCOUNTER — Other Ambulatory Visit: Payer: Self-pay

## 2021-04-20 ENCOUNTER — Ambulatory Visit: Payer: Medicare HMO | Admitting: Occupational Therapy

## 2021-04-20 DIAGNOSIS — R41842 Visuospatial deficit: Secondary | ICD-10-CM | POA: Diagnosis not present

## 2021-04-20 DIAGNOSIS — R41840 Attention and concentration deficit: Secondary | ICD-10-CM

## 2021-04-20 DIAGNOSIS — M6281 Muscle weakness (generalized): Secondary | ICD-10-CM

## 2021-04-20 DIAGNOSIS — R41841 Cognitive communication deficit: Secondary | ICD-10-CM

## 2021-04-20 DIAGNOSIS — R4701 Aphasia: Secondary | ICD-10-CM

## 2021-04-20 DIAGNOSIS — R41844 Frontal lobe and executive function deficit: Secondary | ICD-10-CM

## 2021-04-20 DIAGNOSIS — R2681 Unsteadiness on feet: Secondary | ICD-10-CM | POA: Diagnosis not present

## 2021-04-20 DIAGNOSIS — R2689 Other abnormalities of gait and mobility: Secondary | ICD-10-CM | POA: Diagnosis not present

## 2021-04-20 NOTE — Therapy (Signed)
[facility] [address] [place], [place], [address] Phone: [phone]   Fax:  [phone]  Physical Therapy Treatment  Patient Details  Name: Barbara Kim MRN: [id number] Date of Birth: 06-25-1956 Referring Provider (PT): Kathyrn Sheriff   Encounter Date: 04/20/2021   PT End of Session - 04/20/21 1510     Visit Number 7    Number of Visits 17    Date for PT Re-Evaluation 05/08/21    Authorization Type Aetna [place] required (Codes [phone] and [phone] not covered)    PT Start Time 1233    PT Stop Time 1315    PT Time Calculation (min) 42 min    Equipment Utilized During Treatment Gait belt    Activity Tolerance Patient tolerated treatment well    Behavior During Therapy WFL for tasks assessed/performed             Past Medical History:  Diagnosis Date   Chicken pox    Hypertension    Hyperthyroidism    25 years ago    Past Surgical History:  Procedure Laterality Date   APPLICATION OF CRANIAL NAVIGATION Left 02/20/2021   Procedure: APPLICATION OF CRANIAL NAVIGATION;  Surgeon: Consuella Lose, MD;  Location: [facility];  Service: Neurosurgery;  Laterality: Left;   CRANIOTOMY Left 02/20/2021   Procedure: Stereotactic LEFT pterional craniotomy for resection of meningioma;  Surgeon: Consuella Lose, MD;  Location: [facility];  Service: Neurosurgery;  Laterality: Left;   TONSILLECTOMY  1983    There were no vitals filed for this visit.   Subjective Assessment - 04/20/21 1234     Subjective No other changes since last visit.    Pertinent History hyperthyroidism, HTN    Patient Stated Goals Pt's goals for physical therapy are to improve balance.    Currently in Pain? Yes    Pain Score 1     Pain Location Knee    Pain Orientation Left    Pain Descriptors / Indicators Sore    Pain Onset 1 to 4 weeks ago    Pain Frequency Intermittent    Aggravating Factors  fall last week    Pain Relieving Factors ice                                [facility] Adult PT Treatment/Exercise - 04/20/21 0001       Therapeutic Activites    Therapeutic Activities Other Therapeutic Activities    Other Therapeutic Activities Pt showed/demonstrated 2 yoga poses, focusing on dynamic single limb stance, with UE support at chair.  Pt with increased sway through LLE when LLE is stance limb.      Exercises   Other Exercises  Quadruped alt hip extension x 3 reps, then opposite arm/leg raise x 5 reps each side.  Cues for abdominal activation and posture/control.                 Balance Exercises - 04/20/21 0001       Balance Exercises: Standing   SLS with Vectors Solid surface;Upper extremity assist 1;Limitations    SLS with Vectors Limitations Prolonged single limb stance:  hip/knee flexion, then hip abduction x 10 reps each side; then hip/knee flexion>knee extension>hip/knee flexion x 5 reps, 2 sets each side.  Use of mirror for visual cue for upright posture.    Standing, One Foot on a Step Eyes open;4 inch;Limitations    Standing, One Foot on a Step Limitations  Each foot propped with 1) alt UE lifts x 8 reps; 2) BUE lifts x 8 reps; 3) BUEs clasped with upper body trunk rotation, 4) head turns x 5; 5) head nods x 5.  Supervision of PT and use of visual cues of mirror for upright posture through L hip in SLS.    Heel Raises Right;Left;10 reps               PT Education - 04/20/21 1505     Education Details Additions to HEP; pt continues to ask about plank position; PT advises to hold on plank position at this time to avoid excess strain through her head/incision area.    Person(s) Educated Patient    Methods Explanation    Comprehension Verbalized understanding              PT Short Term Goals - 04/15/21 1107       PT SHORT TERM GOAL #1   Title Pt will be independent with HEP for improved strength, balance, transfers, and gait.  TARGET 04/10/21    Baseline 04/15/21: met with current  program    Status Achieved      PT SHORT TERM GOAL #2   Title Pt will improve FGA score to at least 22/30 to decrease fall risk    Baseline 04/15/21: 26/30 scored today    Time --    Period --    Status Achieved      PT SHORT TERM GOAL #3   Title Pt will improve SLS LLE to 10 seconds for improved overall balance and stability.    Baseline 04/15/21: met in session today    Time --    Period --    Status Achieved               PT Long Term Goals - 04/15/21 1913       PT LONG TERM GOAL #1   Title Pt will be independent with HEP for improved strength, balance, transfers, and gait.  TARGET 05/08/2021    Time 8    Period Weeks    Status On-going      PT LONG TERM GOAL #2   Title Pt will improve FGA score to at least 25/30 to decrease fall risk.    Baseline 04/15/21: 26/30 scored in session today    Status Achieved      PT LONG TERM GOAL #3   Title Pt will improve 6 MWT to at least 1500 ft for improved gait efficiency and endurance.    Baseline 1319 ft    Time 8    Period Weeks    Status On-going      PT LONG TERM GOAL #4   Title Pt will verbalize plans for return to community fitness upon d/c from PT.    Time 8    Period Weeks    Status On-going                   Plan - 04/20/21 1510     Clinical Impression Statement Focused today's skilled PT session on addressing more dynamic single limb stance, as pt wants to get back to yoga practice.  Most of today's prolonged/dynamic single limb stance activities are with light UE support and visual cues of mirror to help with upright posture through LLE when Left leg is stance limb.  She demonstrates some standing Yoga poses incorporating SLS and pt has increased sway with LLE is in stance.  Advised to try some  yoga poses with UE support; pt continues to ask about plank activities and PT feels she may need to avoid full plank at this time to avoid pressure in surgery area.    Personal Factors and Comorbidities Comorbidity  2    Comorbidities PMH: hyperthyroidism, HTN    Examination-Activity Limitations Locomotion Level;Stand    Examination-Participation Restrictions Community Activity;Occupation   Insurance account manager Stable/Uncomplicated    Rehab Potential Good    PT Frequency 2x / week    PT Duration 8 weeks   plus eval   PT Treatment/Interventions ADLs/Self Care Home Management;Gait training;Functional mobility training;Stair training;Therapeutic activities;Therapeutic exercise;Balance training;Neuromuscular re-education;Patient/family education    PT Next Visit Plan Review updates to HEP this visit;  Continue to work on SLS balance, core and hip stability, dynamic balance, compliant surfaces; vestibular training/corner balance exercise progression.  Pt's last visit is next week; she can schedule one more week, 2x, if needed; check LTGs next week/discuss d/c versus renew    Consulted and Agree with Plan of Care Patient             Patient will benefit from skilled therapeutic intervention in order to improve the following deficits and impairments:  Abnormal gait, Difficulty walking, Decreased endurance, Decreased balance, Decreased mobility  Visit Diagnosis: Unsteadiness on feet  Muscle weakness (generalized)     Problem List Patient Active Problem List   Diagnosis Date Noted   Brain tumor ([place]) 02/20/2021   Essential hypertension, benign 03/25/2014    Glorianne Proctor W. 04/20/2021, 3:15 PM Frazier Butt., PT  [facility] [address] [place], [place], [address] Phone: [phone]   Fax:  [phone]  Name: Barbara Kim MRN: [id number] Date of Birth: 06/29/1956

## 2021-04-20 NOTE — Therapy (Signed)
[facility] [address] [place], [place], [address] Phone: [phone]   Fax:  [phone]  Speech Language Pathology Treatment  Patient Details  Name: Barbara Kim MRN: [id number] Date of Birth: Mar 30, 1956 Referring Provider (SLP): Consuella Lose, MD   Encounter Date: 04/20/2021   End of Session - 04/20/21 1145     Visit Number 7    Number of Visits 17    Date for SLP Re-Evaluation 05/06/21    Authorization Type [employer] Medicare    SLP Start Time 1145    SLP Stop Time  1230    SLP Time Calculation (min) 45 min    Activity Tolerance Patient tolerated treatment well             Past Medical History:  Diagnosis Date   Chicken pox    Hypertension    Hyperthyroidism    25 years ago    Past Surgical History:  Procedure Laterality Date   APPLICATION OF CRANIAL NAVIGATION Left 02/20/2021   Procedure: APPLICATION OF CRANIAL NAVIGATION;  Surgeon: Consuella Lose, MD;  Location: [facility];  Service: Neurosurgery;  Laterality: Left;   CRANIOTOMY Left 02/20/2021   Procedure: Stereotactic LEFT pterional craniotomy for resection of meningioma;  Surgeon: Consuella Lose, MD;  Location: [facility];  Service: Neurosurgery;  Laterality: Left;   TONSILLECTOMY  1983    There were no vitals filed for this visit.   Subjective Assessment - 04/20/21 1146     Subjective "I can't wait to start driving"    Currently in Pain? No/denies                   ADULT SLP TREATMENT - 04/20/21 1144       General Information   Behavior/Cognition Alert;Cooperative;Pleasant mood      Treatment Provided   Treatment provided Cognitive-Linquistic      Cognitive-Linquistic Treatment   Treatment focused on Aphasia;Cognition;Patient/family/caregiver education    Skilled Treatment Pt verbalized rationale for upcoming physical tomorrow, with SLP clarification x1 needed as pt demonstrated reduced comprehension of question. SLP  targeted HEP for reading comprehension, in which pt able to verbalize summary with rare dysnomia/anomia. Pt able to correct dysnomia x1 with additional processing time. Pt able to ID targeted anomic words x2 with mod phonemic/written cues. SLP targeted sequencing of 6 steps, in which pt verbally described each photo in sequence with min questioning cues to specifically name and aid word finding x1.      Assessment / Recommendations / Plan   Plan Continue with current plan of care      Progression Toward Goals   Progression toward goals Progressing toward goals              SLP Education - 04/20/21 1408     Education Details naming HEP, using compensations versus abadoning word    Person(s) Educated Patient    Methods Explanation;Demonstration;Handout    Comprehension Verbalized understanding;Returned demonstration;Need further instruction              SLP Short Term Goals - 04/20/21 1145       SLP SHORT TERM GOAL #1   Title Pt will complete aphasia HEP with occasional min A over 2 sessions    Baseline 03-23-21, 04-15-21    Status Achieved      SLP SHORT TERM GOAL #2   Title Pt will verbally ID and correct word finding in paragraph level reading or picture description tasks with min A over 2 sessions  Baseline 04-20-21    Time 1    Period Weeks    Status On-going      SLP SHORT TERM GOAL #3   Title Pt will use word finding compensations in 10 minute mod complex conversation with occasional min A over 2 sessions    Baseline 04-15-21    Time 1    Period Weeks    Status On-going      SLP SHORT TERM GOAL #4   Title Pt will answer (yes/no/WH-) questions regarding paragraph length information with 80% accuracy given occasional cues over 2 sessions    Baseline 04-15-21    Time 1    Period Weeks    Status Revised      SLP SHORT TERM GOAL #5   Title Pt will complete communication PROM in first 1-2 sessions    Baseline Communicative Participation Item Bank= 6    Status  Achieved              SLP Long Term Goals - 04/20/21 1145       SLP LONG TERM GOAL #1   Title Pt will complete aphasia HEP with rare min A over 2 sessions    Time 5    Period Weeks    Status On-going      SLP LONG TERM GOAL #2   Title Pt will use word finding compensations in 15+ minute mod complex conversation with rare min A over 2 sessions    Time 5    Period Weeks    Status On-going      SLP LONG TERM GOAL #3   Title Pt will demonstrate comprehension of multi-paragraph length information with 80% accuracy over 2 sessions    Baseline 04-15-21    Time 5    Period Weeks    Status On-going      SLP LONG TERM GOAL #4   Title Pt will report improved communication effectiveness via communication PROM by last ST session    Time 5    Period Weeks    Status On-going              Plan - 04/20/21 1410     Clinical Impression Statement Barbara Mola "Bett" presents with mild expressive and receptive aphasia secondary to resection of meingioma. Surgery completed 02-20-21. SLP targeted reading comprehension of short articles. Good comprehension and verbal summary of information demonstrated. Word finding x3 exhibited this session, in which pt utilize synonym strategy x1 and ID word x1 with additional time. See "skilled treatment" for additional details. Skilled ST is warranted to address mild aphasia impacting pt's communication effectiveness and to facilitate with return to PLOF.    Speech Therapy Frequency 2x / week    Duration 8 weeks    Treatment/Interventions Functional tasks;Patient/family education;Language facilitation;Compensatory techniques;Internal/external aids;SLP instruction and feedback;Multimodal communcation approach;Compensatory strategies    Potential to Achieve Goals Good    SLP Home Exercise Plan provided    Consulted and Agree with Plan of Care Patient             Patient will benefit from skilled therapeutic intervention in order to improve the  following deficits and impairments:   Aphasia  Cognitive communication deficit    Problem List Patient Active Problem List   Diagnosis Date Noted   Brain tumor ([facility]) 02/20/2021   Essential hypertension, benign 03/25/2014    Alinda Deem, [place] CCC-SLP 04/20/2021, 2:10 PM  [facility] [address] [place], [place], [address]  Phone: [phone]   Fax:  [phone]   Name: Barbara Kim MRN: [id number] Date of Birth: 21-Sep-1956

## 2021-04-20 NOTE — Therapy (Signed)
[facility] [address] [place], [place], [address] Phone: [phone]   Fax:  [phone]  Occupational Therapy Treatment  Patient Details  Name: Barbara Kim MRN: [id number] Date of Birth: 03-Jan-1956 Referring Provider (OT): Sherron Ales   Encounter Date: 04/20/2021   OT End of Session - 04/20/21 1318     Visit Number 6    Number of Visits 17   8 weeks plan of care - anticipate discharge earlier   Date for OT Re-Evaluation 05/07/21    Authorization Type [employer]na Medicare    Authorization Time Period $35 copay per day    Authorization - Visit Number 5   corrected number   Authorization - Number of Visits 10    OT Start Time 1316    OT Stop Time 1358    OT Time Calculation (min) 42 min    Activity Tolerance Patient tolerated treatment well    Behavior During Therapy [facility] for tasks assessed/performed             Past Medical History:  Diagnosis Date   Chicken pox    Hypertension    Hyperthyroidism    25 years ago    Past Surgical History:  Procedure Laterality Date   APPLICATION OF CRANIAL NAVIGATION Left 02/20/2021   Procedure: APPLICATION OF CRANIAL NAVIGATION;  Surgeon: Consuella Lose, MD;  Location: [facility];  Service: Neurosurgery;  Laterality: Left;   CRANIOTOMY Left 02/20/2021   Procedure: Stereotactic LEFT pterional craniotomy for resection of meningioma;  Surgeon: Consuella Lose, MD;  Location: [facility];  Service: Neurosurgery;  Laterality: Left;   TONSILLECTOMY  1983    There were no vitals filed for this visit.   Subjective Assessment - 04/20/21 1317     Subjective  "Went to [place] to dog sit and cat sit and house sit. "    Pertinent History PMH: hyperthyroidism, HTN    Limitations Fall Risk. No driving.    Patient Stated Goals "go back and enjoy reading again"    Currently in Pain? No/denies                          OT Treatments/Exercises (OP) - 04/20/21 1320        ADLs   Cooking pt reports that husband did all the cutting but she did the cooking and rememered all of her things and did not walk away, etc. Pt educated on taking time with cutting but OT is not seeing any UE weakness or decreased coordination and diplopia has resolved a great deal so could try cutting easier items with husband present.    Driving Pt reports that MD has cleared her for graduating driving program with husband supervision at this time.      Cognitive Exercises   Attention Span Alternating Constant Therapy Alternating Symbols level 7 with 92% accuracy and 98.65s response time    Attention Span Other Constant Therapy Same Symbol with 96% accuracy and 37.24s response time.      Visual/Perceptual Exercises   Scanning Environmental    Scanning - Environmental 6/12 with 50% accuracy on first pass. Pt required max cues and color to look for on second pass to find remaining 6                      OT Short Term Goals - 04/15/21 1150       OT SHORT TERM GOAL #1   Title Pt/caregiver will  verbalize understanding of memory compensation strategies    Time 4    Period Weeks    Status Achieved    Target Date 04/10/21      OT SHORT TERM GOAL #2   Title Pt will attend to a novel cognitive task for 5 minutes with no cue for redirection    Time 4    Period Weeks    Status Achieved      OT SHORT TERM GOAL #3   Title Pt will be independent with diplopia HEP    Time 4    Period Weeks    Status Achieved      OT SHORT TERM GOAL #4   Title Pt will complete environmental scanning with 90% accuracy or greater    Time 4    Period Weeks    Status On-going   87% on 03/23/21              OT Long Term Goals - 04/20/21 1331       OT LONG TERM GOAL #1   Title Pt will report significant decrease in diplopia with watching TV and reading and other daily activities.    Time 8    Period Weeks    Status Achieved   only reports it at night/end of the day. 03/25/21      OT LONG TERM GOAL #2   Title Pt will perform physical and cognitive task simultaneously with 90% accuracy or greater    Time 8    Period Weeks    Status On-going   approx 80% accuracy (min to mod questioning cues/prompts for cognitive task)     OT LONG TERM GOAL #3   Title Pt will plan and execute a meal for her spouse and herself with supervision and good safety awareness    Time 8    Period Weeks    Status On-going      OT LONG TERM GOAL #4   Title Pt will verbalize understanding of cognitive and adapted strategies and equipment PRN for increased safety and independence with ADLs and IADLs.    Time 8    Period Weeks    Status New                   Plan - 04/20/21 1349     Clinical Impression Statement Pt is getting back to partiicpating in cooking and other IADLs. Pt continues to have difficulty with overall attention with environmental scanning, etc.    OT Occupational Profile and History Problem Focused Assessment - Including review of records relating to presenting problem    Occupational performance deficits (Please refer to evaluation for details): IADL's;ADL's;Leisure;Work    Marketing executive / Function / Physical Skills ADL;Decreased knowledge of use of DME;FMC;Vision;IADL;ROM;UE functional use;GMC;Balance;Strength    Cognitive Skills Attention;Safety Awareness;Problem Solve    Rehab Potential Good    Clinical Decision Making Limited treatment options, no task modification necessary    Comorbidities Affecting Occupational Performance: None    Modification or Assistance to Complete Evaluation  No modification of tasks or assist necessary to complete eval    OT Frequency 2x / week    OT Duration 8 weeks   plan of care for 8 week - anticipate d/c earlier.   OT Treatment/Interventions Self-care/ADL training;Energy conservation;Patient/family education;Visual/perceptual remediation/compensation;Functional Mobility Training;Neuromuscular education;Therapeutic  exercise;Cognitive remediation/compensation;Therapeutic activities;DME and/or AE instruction    Plan cooking task    Consulted and Agree with Plan of Care Patient  Patient will benefit from skilled therapeutic intervention in order to improve the following deficits and impairments:   Body Structure / Function / Physical Skills: ADL, Decreased knowledge of use of DME, FMC, Vision, IADL, ROM, UE functional use, GMC, Balance, Strength Cognitive Skills: Attention, Safety Awareness, Problem Solve     Visit Diagnosis: Visuospatial deficit  Frontal lobe and executive function deficit  Attention and concentration deficit  Unsteadiness on feet  Muscle weakness (generalized)    Problem List Patient Active Problem List   Diagnosis Date Noted   Brain tumor ([place]) 02/20/2021   Essential hypertension, benign 03/25/2014    Zachery Conch MOT, OTR/L  04/20/2021, 2:10 PM  [facility] [address] [place], [place], [address] Phone: [phone]   Fax:  [phone]  Name: Barbara Kim MRN: [id number] Date of Birth: 06-Jan-1956

## 2021-04-20 NOTE — Patient Instructions (Addendum)
Access Code: [email or web address] URL: [email or web address] Date: 04/20/2021 Prepared by: Mady Haagensen  Exercises Walking Tandem Stance - 1 x daily - 5 x weekly - 1 sets - 3 reps Sidestepping - 1 x daily - 5 x weekly - 1 sets - 3 reps Standing Balance in Corner - 1 x daily - 5 x weekly - 1 sets - 10 reps Standing Balance in Corner with Eyes Closed - 1 x daily - 5 x weekly - 1 sets - 10 reps Romberg Stance on Foam Pad - 1 x daily - 5 x weekly - 1 sets - 10 reps  Added 04/20/2021 (Dynamic) Single Leg Stance with Support - 1 x daily - 5 x weekly - 1 sets - 5-10 reps (Foot propped on cabinet shelf) with Counter Support - 1 x daily - 5 x weekly - 1 sets - 5-10 reps

## 2021-04-21 ENCOUNTER — Telehealth: Payer: Self-pay | Admitting: Family Medicine

## 2021-04-21 ENCOUNTER — Encounter: Payer: Self-pay | Admitting: Family Medicine

## 2021-04-21 ENCOUNTER — Ambulatory Visit (INDEPENDENT_AMBULATORY_CARE_PROVIDER_SITE_OTHER): Payer: Medicare HMO | Admitting: Family Medicine

## 2021-04-21 VITALS — BP 138/68 | HR 64 | Temp 97.9°F | Ht <= 58 in | Wt 109.2 lb

## 2021-04-21 DIAGNOSIS — Z780 Asymptomatic menopausal state: Secondary | ICD-10-CM

## 2021-04-21 DIAGNOSIS — Z23 Encounter for immunization: Secondary | ICD-10-CM

## 2021-04-21 DIAGNOSIS — Z0000 Encounter for general adult medical examination without abnormal findings: Secondary | ICD-10-CM | POA: Diagnosis not present

## 2021-04-21 DIAGNOSIS — Z86011 Personal history of benign neoplasm of the brain: Secondary | ICD-10-CM | POA: Insufficient documentation

## 2021-04-21 LAB — HEPATIC FUNCTION PANEL
ALT: 10 U/L (ref 0–35)
AST: 13 U/L (ref 0–37)
Albumin: 4 g/dL (ref 3.5–5.2)
Alkaline Phosphatase: 87 U/L (ref 39–117)
Bilirubin, Direct: 0 mg/dL (ref 0.0–0.3)
Total Bilirubin: 0.3 mg/dL (ref 0.2–1.2)
Total Protein: 7.2 g/dL (ref 6.0–8.3)

## 2021-04-21 LAB — LIPID PANEL
Cholesterol: 226 mg/dL — ABNORMAL HIGH (ref 0–200)
HDL: 53 mg/dL (ref >39.00)
LDL Cholesterol: 142 mg/dL — ABNORMAL HIGH (ref 0–99)
NonHDL: 173.42
Total CHOL/HDL Ratio: 4
Triglycerides: 159 mg/dL — ABNORMAL HIGH (ref 0.0–149.0)
VLDL: 31.8 mg/dL (ref 0.0–40.0)

## 2021-04-21 LAB — BASIC METABOLIC PANEL
BUN: 22 mg/dL (ref 6–23)
CO2: 27 mEq/L (ref 19–32)
Calcium: 9.1 mg/dL (ref 8.4–10.5)
Chloride: 104 mEq/L (ref 96–112)
Creatinine, Ser: 0.63 mg/dL (ref 0.40–1.20)
GFR: 93.05 mL/min (ref >60.00)
Glucose, Bld: 99 mg/dL (ref 70–99)
Potassium: 3.9 mEq/L (ref 3.5–5.1)
Sodium: 139 mEq/L (ref 135–145)

## 2021-04-21 LAB — CBC WITH DIFFERENTIAL/PLATELET
Basophils Absolute: 0.1 10*3/uL (ref 0.0–0.1)
Basophils Relative: 0.9 % (ref 0.0–3.0)
Eosinophils Absolute: 0.2 10*3/uL (ref 0.0–0.7)
Eosinophils Relative: 3.6 % (ref 0.0–5.0)
HCT: 40.2 % (ref 36.0–46.0)
Hemoglobin: 13.4 g/dL (ref 12.0–15.0)
Lymphocytes Relative: 36.5 % (ref 12.0–46.0)
Lymphs Abs: 2.1 10*3/uL (ref 0.7–4.0)
MCHC: 33.3 g/dL (ref 30.0–36.0)
MCV: 92.3 fl (ref 78.0–100.0)
Monocytes Absolute: 0.5 10*3/uL (ref 0.1–1.0)
Monocytes Relative: 8.5 % (ref 3.0–12.0)
Neutro Abs: 3 10*3/uL (ref 1.4–7.7)
Neutrophils Relative %: 50.5 % (ref 43.0–77.0)
Platelets: 207 10*3/uL (ref 150.0–400.0)
RBC: 4.35 Mil/uL (ref 3.87–5.11)
RDW: 14 % (ref 11.5–15.5)
WBC: 5.9 10*3/uL (ref 4.0–10.5)

## 2021-04-21 LAB — HEMOGLOBIN A1C: Hgb A1c MFr Bld: 5.6 % (ref 4.6–6.5)

## 2021-04-21 LAB — TSH: TSH: 0.89 u[IU]/mL (ref 0.35–5.50)

## 2021-04-21 MED ORDER — AMLODIPINE BESYLATE 5 MG PO TABS: 5.0000 mg | ORAL_TABLET | Freq: Every day | ORAL | 3 refills | Status: DC

## 2021-04-21 NOTE — Progress Notes (Signed)
Established Patient Office Visit  Subjective:  Patient ID: Barbara Kim, female    DOB: [birth year]  Age: 65 y.o. MRN: [id number]  CC:  Chief Complaint  Patient presents with   Annual Exam    HPI Barbara Kim presents for physical exam.   She had presented here in April with modest weight loss, new right UE tremor and cognitive change.   MRI showed left temporal meningioma with prominent local mass-effect and some midline shifting of up to 7 mm..   She had resection on 02-20-21 and her surgery went very well.  She had some mild aphasia afterwards and has had speech therapy and had some balance issues and has had physical therapy.  She is making good progress.  Her speech is very clear at this time.  She has had some as expected numbness of the scalp but no headaches.  She has been followed very closely by neurosurgery in follow-up.  She is very pleased overall with the outcome of her surgery  Health maintenance reviewed  -Previous hepatitis C screening negative -Tetanus due 2025 -Last mammogram 11/21 -No history of pneumonia vaccine -No history of DEXA scan -COVID vaccines up-to-date -No history of shingles vaccine -Has never had colonoscopy --Pap smear up-to-date  Family history-unknown as she was adopted.  She is married.  Never smoked.  No regular alcohol.  No illicit drug use.  Works as a Geophysicist/field seismologist.  Past Medical History:  Diagnosis Date   Chicken pox    Hypertension    Hyperthyroidism    25 years ago    Past Surgical History:  Procedure Laterality Date   APPLICATION OF CRANIAL NAVIGATION Left 02/20/2021   Procedure: APPLICATION OF CRANIAL NAVIGATION;  Surgeon: Consuella Lose, MD;  Location: [facility];  Service: Neurosurgery;  Laterality: Left;   CRANIOTOMY Left 02/20/2021   Procedure: Stereotactic LEFT pterional craniotomy for resection of meningioma;  Surgeon: Consuella Lose, MD;  Location: [facility];  Service: Neurosurgery;  Laterality: Left;    TONSILLECTOMY  1983    Family History  Adopted: Yes    Social History   Socioeconomic History   Marital status: Unknown    Spouse name: Not on file   Number of children: Not on file   Years of education: Not on file   Highest education level: Not on file  Occupational History   Not on file  Tobacco Use   Smoking status: Never   Smokeless tobacco: Never  Vaping Use   Vaping Use: Never used  Substance and Sexual Activity   Alcohol use: No   Drug use: No   Sexual activity: Not on file  Other Topics Concern   Not on file  Social History Narrative   Not on file   Social Determinants of Health   Financial Resource Strain: Not on file  Food Insecurity: Not on file  Transportation Needs: Not on file  Physical Activity: Not on file  Stress: Not on file  Social Connections: Not on file  Intimate Partner Violence: Not on file    Outpatient Medications Prior to Visit  Medication Sig Dispense Refill   amLODipine (NORVASC) 5 MG tablet Take 1 tablet (5 mg total) by mouth daily. 90 tablet 0   HYDROcodone-acetaminophen (NORCO/VICODIN) 5-325 MG tablet Take 1 tablet by mouth every 4 (four) hours as needed for moderate pain. 30 tablet 0   levETIRAcetam (KEPPRA) 500 MG tablet Take 500 mg by mouth 2 (two) times daily.     Vitamin D, Ergocalciferol, (DRISDOL) 50000  units CAPS capsule TAKE 1 CAPSULE (50,000 UNITS TOTAL) BY MOUTH EVERY 7 (SEVEN) DAYS. 4 capsule 0   No facility-administered medications prior to visit.    No Known Allergies  ROS Review of Systems  Constitutional:  Negative for activity change, appetite change, fatigue, fever and unexpected weight change.  HENT:  Negative for ear pain, hearing loss, sore throat and trouble swallowing.   Eyes:  Negative for visual disturbance.  Respiratory:  Negative for cough and shortness of breath.   Cardiovascular:  Negative for chest pain and palpitations.  Gastrointestinal:  Negative for abdominal pain, blood in stool,  constipation and diarrhea.  Endocrine: Negative for polydipsia and polyuria.  Genitourinary:  Negative for dysuria and hematuria.  Musculoskeletal:  Negative for arthralgias, back pain and myalgias.  Skin:  Negative for rash.  Neurological:  Negative for dizziness, syncope and headaches.  Hematological:  Negative for adenopathy.  Psychiatric/Behavioral:  Negative for confusion and dysphoric mood.      Objective:    Physical Exam Constitutional:      Appearance: She is well-developed.  HENT:     Head: Normocephalic and atraumatic.  Eyes:     Pupils: Pupils are equal, round, and reactive to light.  Neck:     Thyroid: No thyromegaly.  Cardiovascular:     Rate and Rhythm: Normal rate and regular rhythm.     Heart sounds: Normal heart sounds. No murmur heard. Pulmonary:     Effort: No respiratory distress.     Breath sounds: Normal breath sounds. No wheezing or rales.  Abdominal:     General: Bowel sounds are normal. There is no distension.     Palpations: Abdomen is soft. There is no mass.     Tenderness: There is no abdominal tenderness. There is no guarding or rebound.  Genitourinary:    Comments: Per GYN Musculoskeletal:        General: Normal range of motion.     Cervical back: Normal range of motion and neck supple.  Lymphadenopathy:     Cervical: No cervical adenopathy.  Skin:    Findings: No rash.  Neurological:     Mental Status: She is alert and oriented to person, place, and time.     Cranial Nerves: No cranial nerve deficit.     Deep Tendon Reflexes: Reflexes normal.  Psychiatric:        Behavior: Behavior normal.        Thought Content: Thought content normal.        Judgment: Judgment normal.    BP 138/68 (BP Location: Right Arm, Patient Position: Sitting, Cuff Size: Normal)   Pulse 64   Temp 97.9 F (36.6 C) (Oral)   Ht '4\' 8"'$  (1.422 m)   Wt 109 lb 3.2 oz (49.5 kg)   SpO2 99%   BMI 24.48 kg/m  Wt Readings from Last 3 Encounters:  04/21/21 109 lb  3.2 oz (49.5 kg)  02/20/21 98 lb 15.8 oz (44.9 kg)  02/17/21 99 lb (44.9 kg)     Health Maintenance Due  Topic Date Due   HIV Screening  Never done   COLONOSCOPY (Pts 45-3yr Insurance coverage will need to be confirmed)  Never done   Zoster Vaccines- Shingrix (1 of 2) Never done   COVID-19 Vaccine (3 - Booster for Moderna series) 05/26/2020   DEXA SCAN  Never done   PNA vac Low Risk Adult (1 of 2 - PCV13) Never done   PAP SMEAR-Modifier  06/07/2021    There are  no preventive care reminders to display for this patient.  Lab Results  Component Value Date   TSH 1.49 01/20/2021   Lab Results  Component Value Date   WBC 10.7 (H) 02/23/2021   HGB 9.2 (L) 02/23/2021   HCT 26.7 (L) 02/23/2021   MCV 93.7 02/23/2021   PLT  02/23/2021    PLATELET CLUMPS NOTED ON SMEAR, COUNT APPEARS DECREASED   Lab Results  Component Value Date   NA 140 02/23/2021   K 3.3 (L) 02/23/2021   CO2 27 02/23/2021   GLUCOSE 124 (H) 02/23/2021   BUN 6 (L) 02/23/2021   CREATININE 0.47 02/23/2021   BILITOT 0.5 01/20/2021   ALKPHOS 58 01/20/2021   AST 13 01/20/2021   ALT 12 01/20/2021   PROT 7.4 01/20/2021   ALBUMIN 2.4 (L) 02/23/2021   CALCIUM 8.1 (L) 02/23/2021   ANIONGAP 8 02/23/2021   GFR 92.52 01/20/2021   Lab Results  Component Value Date   CHOL 234 (H) 06/22/2019   Lab Results  Component Value Date   HDL 59.50 06/22/2019   Lab Results  Component Value Date   LDLCALC 153 (H) 06/22/2019   Lab Results  Component Value Date   TRIG 105.0 06/22/2019   Lab Results  Component Value Date   CHOLHDL 4 06/22/2019   No results found for: HGBA1C    Assessment & Plan:   Physical exam.  She has hypertension which is stable and needs refills of amlodipine.  She had recent meningioma resection left temporal region and has done well since surgery.  We addressed several health maintenance issues as below  -Recommend annual flu vaccine -Prevnar 20 given -Recommend Shingrix vaccine at some  point this year and she will consider -Set up DEXA scan -Set up screening colonoscopy -Continue with regular weightbearing exercise -She plans to continue with annual mammogram and she sets these are up herself -Refill amlodipine for 1 year   No orders of the defined types were placed in this encounter.   Follow-up: No follow-ups on file.    Carolann Littler, MD

## 2021-04-21 NOTE — Addendum Note (Signed)
Addended by: Anderson Malta on: 04/21/2021 01:05 PM   Modules accepted: Orders

## 2021-04-21 NOTE — Telephone Encounter (Signed)
LMVM on the cell phone and home phone voice mail  Dr. Elease Hashimoto has placed the orders to schedule the patient for a bone density appointment at [facility]

## 2021-04-22 ENCOUNTER — Ambulatory Visit: Payer: Medicare HMO | Admitting: Physical Therapy

## 2021-04-22 ENCOUNTER — Other Ambulatory Visit: Payer: Self-pay

## 2021-04-22 ENCOUNTER — Ambulatory Visit: Payer: Medicare HMO | Admitting: Occupational Therapy

## 2021-04-22 ENCOUNTER — Ambulatory Visit: Payer: Medicare HMO

## 2021-04-22 DIAGNOSIS — R41840 Attention and concentration deficit: Secondary | ICD-10-CM

## 2021-04-22 DIAGNOSIS — R41844 Frontal lobe and executive function deficit: Secondary | ICD-10-CM | POA: Diagnosis not present

## 2021-04-22 DIAGNOSIS — R2689 Other abnormalities of gait and mobility: Secondary | ICD-10-CM | POA: Diagnosis not present

## 2021-04-22 DIAGNOSIS — R41842 Visuospatial deficit: Secondary | ICD-10-CM | POA: Diagnosis not present

## 2021-04-22 DIAGNOSIS — R41841 Cognitive communication deficit: Secondary | ICD-10-CM

## 2021-04-22 DIAGNOSIS — R2681 Unsteadiness on feet: Secondary | ICD-10-CM

## 2021-04-22 DIAGNOSIS — M6281 Muscle weakness (generalized): Secondary | ICD-10-CM

## 2021-04-22 DIAGNOSIS — R4701 Aphasia: Secondary | ICD-10-CM

## 2021-04-22 NOTE — Therapy (Signed)
[facility] [address] [place], [place], [address] Phone: [phone]   Fax:  [phone]  Speech Language Pathology Treatment  Patient Details  Name: Barbara Kim MRN: [id number] Date of Birth: Jan 15, 1956 Referring Provider (SLP): Consuella Lose, MD   Encounter Date: 04/22/2021   End of Session - 04/22/21 0957     Visit Number 8    Number of Visits 17    Date for SLP Re-Evaluation 05/06/21    Authorization Type [employer]    SLP Start Time 68    SLP Stop Time  1100    SLP Time Calculation (min) 41 min    Activity Tolerance Patient tolerated treatment well             Past Medical History:  Diagnosis Date   Chicken pox    Hypertension    Hyperthyroidism    25 years ago    Past Surgical History:  Procedure Laterality Date   APPLICATION OF CRANIAL NAVIGATION Left 02/20/2021   Procedure: APPLICATION OF CRANIAL NAVIGATION;  Surgeon: Consuella Lose, MD;  Location: [facility];  Service: Neurosurgery;  Laterality: Left;   CRANIOTOMY Left 02/20/2021   Procedure: Stereotactic LEFT pterional craniotomy for resection of meningioma;  Surgeon: Consuella Lose, MD;  Location: [facility];  Service: Neurosurgery;  Laterality: Left;   TONSILLECTOMY  1983    There were no vitals filed for this visit.   Subjective Assessment - 04/22/21 1020     Subjective "I just baked for the first time in 3 months"    Currently in Pain? No/denies                   ADULT SLP TREATMENT - 04/22/21 0957       General Information   Behavior/Cognition Alert;Cooperative;Pleasant mood      Treatment Provided   Treatment provided Cognitive-Linquistic      Cognitive-Linquistic Treatment   Treatment focused on Aphasia;Cognition;Patient/family/caregiver education    Skilled Treatment Pt required rare min A on naming HEP. SLP educated patient on family providing cues versus answers, in which pt verbalized  understanding. SLP targeted reading comprehension of paragraph level information. Pt read paragraphs aloud with no dysnomia. Pt comprehended with 85% accuracy for [facility]- questions. Rare anomia exhibited x2 this session, in which pt able to ID with additional processing time and use of synonym strategy.      Assessment / Recommendations / Plan   Plan Continue with current plan of care      Progression Toward Goals   Progression toward goals Progressing toward goals              SLP Education - 04/22/21 1250     Education Details cueing hierarchy, HEP    Person(s) Educated Patient    Methods Explanation;Demonstration;Handout    Comprehension Verbalized understanding;Returned demonstration;Need further instruction              SLP Short Term Goals - 04/22/21 0958       SLP SHORT TERM GOAL #1   Title Pt will complete aphasia HEP with occasional min A over 2 sessions    Baseline 03-23-21, 04-15-21    Status Achieved      SLP SHORT TERM GOAL #2   Title Pt will verbally ID and correct word finding in paragraph level reading or picture description tasks with min A over 2 sessions    Baseline 04-20-21, 04-22-21    Time --    Period --  Status Achieved      SLP SHORT TERM GOAL #3   Title Pt will use word finding compensations in 10 minute mod complex conversation with occasional min A over 2 sessions    Baseline 04-15-21    Time 1    Period Weeks    Status On-going      SLP SHORT TERM GOAL #4   Title Pt will answer (yes/no/WH-) questions regarding paragraph length information with 80% accuracy given occasional cues over 2 sessions    Baseline 04-15-21, 04-22-21    Time --    Period --    Status Achieved      SLP SHORT TERM GOAL #5   Title Pt will complete communication PROM in first 1-2 sessions    Baseline Communicative Participation Item Bank= 6    Status Achieved              SLP Long Term Goals - 04/22/21 [id number]       SLP LONG TERM GOAL #1   Title Pt will  complete aphasia HEP with rare min A over 2 sessions    Baseline 04-22-21    Time 5    Period Weeks    Status On-going      SLP LONG TERM GOAL #2   Title Pt will use word finding compensations in 15+ minute mod complex conversation with rare min A over 2 sessions    Time 5    Period Weeks    Status On-going      SLP LONG TERM GOAL #3   Title Pt will demonstrate comprehension of multi-paragraph length information with 80% accuracy over 2 sessions    Baseline 04-15-21    Time 5    Period Weeks    Status On-going      SLP LONG TERM GOAL #4   Title Pt will report improved communication effectiveness via communication PROM by last ST session    Time 5    Period Weeks    Status On-going              Plan - 04/22/21 1017     Clinical Impression Statement [name] "Bett" presents with mild expressive and receptive aphasia secondary to resection of meingioma. Surgery completed 02-20-21. SLP targeted reading comprehension of short paragraphs. Good comprehension exhibited with rare cues. Word finding x2 exhibited this session, in which pt utilize synonym strategy x1 and ID word x1 with additional time. See "skilled treatment" for additional details. Skilled ST is warranted to address mild aphasia impacting pt's communication effectiveness and to facilitate with return to PLOF.    Speech Therapy Frequency 2x / week    Duration 8 weeks    Treatment/Interventions Functional tasks;Patient/family education;Language facilitation;Compensatory techniques;Internal/external aids;SLP instruction and feedback;Multimodal communcation approach;Compensatory strategies    Potential to Achieve Goals Good    SLP Home Exercise Plan provided    Consulted and Agree with Plan of Care Patient             Patient will benefit from skilled therapeutic intervention in order to improve the following deficits and impairments:   Aphasia  Cognitive communication deficit    Problem List Patient Active  Problem List   Diagnosis Date Noted   H/O meningioma of the brain 04/21/2021   Brain tumor ([place]) 02/20/2021   Essential hypertension, benign 03/25/2014    Alinda Deem, MA CCC-SLP 04/22/2021, 12:53 PM  [facility] [address] [place], [place], [address] Phone: [phone]  Fax:  [phone]   Name: Ayat Krisch MRN: [id number] Date of Birth: [birth year]

## 2021-04-22 NOTE — Therapy (Signed)
[facility] [address] [place], [place], [address] Phone: [phone]   Fax:  [phone]  Physical Therapy Treatment  Patient Details  Name: Barbara Kim MRN: [id number] Date of Birth: March 30, 1956 Referring Provider (PT): Kathyrn Sheriff   Encounter Date: 04/22/2021   PT End of Session - 04/22/21 1251     Visit Number 8    Number of Visits 17    Date for PT Re-Evaluation 05/08/21    Authorization Type [employer]na [place] required (Codes [phone] and [phone] not covered)    PT Start Time 1102    PT Stop Time 1147    PT Time Calculation (min) 45 min    Equipment Utilized During Treatment --    Activity Tolerance Patient tolerated treatment well    Behavior During Therapy [facility] for tasks assessed/performed             Past Medical History:  Diagnosis Date   Chicken pox    Hypertension    Hyperthyroidism    25 years ago    Past Surgical History:  Procedure Laterality Date   APPLICATION OF CRANIAL NAVIGATION Left 02/20/2021   Procedure: APPLICATION OF CRANIAL NAVIGATION;  Surgeon: Consuella Lose, MD;  Location: [facility];  Service: Neurosurgery;  Laterality: Left;   CRANIOTOMY Left 02/20/2021   Procedure: Stereotactic LEFT pterional craniotomy for resection of meningioma;  Surgeon: Consuella Lose, MD;  Location: [facility];  Service: Neurosurgery;  Laterality: Left;   TONSILLECTOMY  1983    There were no vitals filed for this visit.   Subjective Assessment - 04/22/21 1245     Subjective No changes, just still feel my core is weak.  Tried some standing yoga with support at chair.    Pertinent History hyperthyroidism, HTN    Patient Stated Goals Pt's goals for physical therapy are to improve balance.    Currently in Pain? No/denies    Pain Onset 1 to 4 weeks ago                               [facility] Adult PT Treatment/Exercise - 04/22/21 0001       Ambulation/Gait   Ambulation/Gait Yes     Ambulation/Gait Assistance 5: Supervision    Ambulation/Gait Assistance Details ambulated 6 minutes around gym; tends to veer closely to furniture/wall/counter on L side with turns towards L.  Needs cues to correct    Ambulation Distance (Feet) 1354 Feet   in 6 minutes; Total 1400 ft   Assistive device None    Gait Pattern Step-through pattern;Decreased arm swing - right;Decreased arm swing - left;Wide base of support    Ambulation Surface Level;Indoor      High Level Balance   High Level Balance Comments Tandem gait along balance beam, min guard/min assist at hips (no UE support, but arms in mid-high guard), performed x 4 reps; then tandem>side toe taps min guard x 2 reps      Exercises   Exercises Other Exercises    Other Exercises  Core stability exercises:  seated on red therapy ball-clasped hands with upper trunk rotation, bilat UE lifts, marching in place, LAQ, marching with alt UE taps to opposite knee.  Tall kneeling with clasped hands and trunk rotation, alt UE lifts; then tall>half kneeling alternating sides x 5 reps.  1/2 kneel position with clasped hands and upper trunk rotation, alt UE lifts and UE diagonals.  Cues for abdominal activation and control of  upper and lower body movements.               Reviewed single limb stance activities given as part of HEP last visit.  Pt needs reminder cues for technique.         PT Short Term Goals - 04/15/21 1107       PT SHORT TERM GOAL #1   Title Pt will be independent with HEP for improved strength, balance, transfers, and gait.  TARGET 04/10/21    Baseline 04/15/21: met with current program    Status Achieved      PT SHORT TERM GOAL #2   Title Pt will improve FGA score to at least 22/30 to decrease fall risk    Baseline 04/15/21: 26/30 scored today    Time --    Period --    Status Achieved      PT SHORT TERM GOAL #3   Title Pt will improve SLS LLE to 10 seconds for improved overall balance and stability.    Baseline  04/15/21: met in session today    Time --    Period --    Status Achieved               PT Long Term Goals - 04/15/21 1913       PT LONG TERM GOAL #1   Title Pt will be independent with HEP for improved strength, balance, transfers, and gait.  TARGET 05/08/2021    Time 8    Period Weeks    Status On-going      PT LONG TERM GOAL #2   Title Pt will improve FGA score to at least 25/30 to decrease fall risk.    Baseline 04/15/21: 26/30 scored in session today    Status Achieved      PT LONG TERM GOAL #3   Title Pt will improve 6 MWT to at least 1500 ft for improved gait efficiency and endurance.    Baseline 1319 ft    Time 8    Period Weeks    Status On-going      PT LONG TERM GOAL #4   Title Pt will verbalize plans for return to community fitness upon d/c from PT.    Time 8    Period Weeks    Status On-going                   Plan - 04/22/21 1252     Clinical Impression Statement Continued to focus on core and hip stability as well as single limb stance and compliant surface balance.  Pt needs reminder cues for abdominal activation and is most challenged in 1/2 kneel position and tandem gait on compliant balance beam.  She will contineu to benefit from skilled PT to further address strength, high level balance towards goals.    Personal Factors and Comorbidities Comorbidity 2    Comorbidities PMH: hyperthyroidism, HTN    Examination-Activity Limitations Locomotion Level;Stand    Examination-Participation Restrictions Community Activity;Occupation   Insurance account manager Stable/Uncomplicated    Rehab Potential Good    PT Frequency 2x / week    PT Duration 8 weeks   plus eval   PT Treatment/Interventions ADLs/Self Care Home Management;Gait training;Functional mobility training;Stair training;Therapeutic activities;Therapeutic exercise;Balance training;Neuromuscular re-education;Patient/family education    PT Next Visit Plan Continue to work  on SLS balance, core and hip stability, dynamic balance, compliant surfaces; vestibular training/corner balance exercise progression.  Work towards [place] (pt should have scheduled  1 more week through [facility]).    Consulted and Agree with Plan of Care Patient             Patient will benefit from skilled therapeutic intervention in order to improve the following deficits and impairments:  Abnormal gait, Difficulty walking, Decreased endurance, Decreased balance, Decreased mobility  Visit Diagnosis: Muscle weakness (generalized)  Unsteadiness on feet  Other abnormalities of gait and mobility     Problem List Patient Active Problem List   Diagnosis Date Noted   H/O meningioma of the brain 04/21/2021   Brain tumor ([place]) 02/20/2021   Essential hypertension, benign 03/25/2014    Luciann Gossett W. 04/22/2021, 12:55 PM Frazier Butt., PT   [facility] [address] [place], [place], [address] Phone: [phone]   Fax:  [phone]  Name: Barbara Kim MRN: [id number] Date of Birth: [birth year]

## 2021-04-22 NOTE — Therapy (Signed)
[facility] [address] [place], [place], [address] Phone: [phone]   Fax:  [phone]  Occupational Therapy Treatment  Patient Details  Name: Barbara Kim MRN: [id number] Date of Birth: Feb 27, 1956 Referring Provider (OT): Sherron Ales   Encounter Date: 04/22/2021   OT End of Session - 04/22/21 0959     Visit Number 7    Number of Visits 17   8 weeks plan of care - anticipate discharge earlier   Date for OT Re-Evaluation 05/07/21    Authorization Type A[employer]na Medicare    Authorization Time Period $35 copay per day    Authorization - Visit Number 6   corrected number   Authorization - Number of Visits 10    OT Start Time 0930    OT Stop Time 1015    OT Time Calculation (min) 45 min    Activity Tolerance Patient tolerated treatment well    Behavior During Therapy [facility] for tasks assessed/performed             Past Medical History:  Diagnosis Date   Chicken pox    Hypertension    Hyperthyroidism    25 years ago    Past Surgical History:  Procedure Laterality Date   APPLICATION OF CRANIAL NAVIGATION Left 02/20/2021   Procedure: APPLICATION OF CRANIAL NAVIGATION;  Surgeon: Consuella Lose, MD;  Location: [facility];  Service: Neurosurgery;  Laterality: Left;   CRANIOTOMY Left 02/20/2021   Procedure: Stereotactic LEFT pterional craniotomy for resection of meningioma;  Surgeon: Consuella Lose, MD;  Location: [facility];  Service: Neurosurgery;  Laterality: Left;   TONSILLECTOMY  1983    There were no vitals filed for this visit.   Subjective Assessment - 04/22/21 0936     Subjective  I've cooked 2 meals at home. I peeled a papaya yesterday and was very careful. It took me a lot longer    Pertinent History PMH: hyperthyroidism, HTN    Limitations Fall Risk. No driving.    Patient Stated Goals "go back and enjoy reading again"    Currently in Pain? No/denies             Pt making brownies w/  min cues to read step 1 to preheat oven and spray pan before beginning other steps. Pt also needed questioning cue to remember egg. Pt needed assist to find all items due to unfamiliar kitchen but also decreased visual scanning. Pt also needed cues to spread batter in pan and for safety getting pan in oven (pt left oven door partially closed).   Constant therapy for attention: Pictures in order - level 1 with 96% accuracy, level 3 with only 66% accuracy                       OT Short Term Goals - 04/15/21 1150       OT SHORT TERM GOAL #1   Title Pt/caregiver will verbalize understanding of memory compensation strategies    Time 4    Period Weeks    Status Achieved    Target Date 04/10/21      OT SHORT TERM GOAL #2   Title Pt will attend to a novel cognitive task for 5 minutes with no cue for redirection    Time 4    Period Weeks    Status Achieved      OT SHORT TERM GOAL #3   Title Pt will be independent with diplopia HEP    Time  4    Period Weeks    Status Achieved      OT SHORT TERM GOAL #4   Title Pt will complete environmental scanning with 90% accuracy or greater    Time 4    Period Weeks    Status On-going   87% on 03/23/21              OT Long Term Goals - 04/20/21 1331       OT LONG TERM GOAL #1   Title Pt will report significant decrease in diplopia with watching TV and reading and other daily activities.    Time 8    Period Weeks    Status Achieved   only reports it at night/end of the day. 03/25/21     OT LONG TERM GOAL #2   Title Pt will perform physical and cognitive task simultaneously with 90% accuracy or greater    Time 8    Period Weeks    Status On-going   approx 80% accuracy (min to mod questioning cues/prompts for cognitive task)     OT LONG TERM GOAL #3   Title Pt will plan and execute a meal for her spouse and herself with supervision and good safety awareness    Time 8    Period Weeks    Status On-going      OT LONG  TERM GOAL #4   Title Pt will verbalize understanding of cognitive and adapted strategies and equipment PRN for increased safety and independence with ADLs and IADLs.    Time 8    Period Weeks    Status New                   Plan - 04/22/21 1000     Clinical Impression Statement Pt overall still requires min cues and supervision for cooking task. Pt with decreased attention to detail and scanning    OT Occupational Profile and History Problem Focused Assessment - Including review of records relating to presenting problem    Occupational performance deficits (Please refer to evaluation for details): IADL's;ADL's;Leisure;Work    Marketing executive / Function / Physical Skills ADL;Decreased knowledge of use of DME;FMC;Vision;IADL;ROM;UE functional use;GMC;Balance;Strength    Cognitive Skills Attention;Safety Awareness;Problem Solve    Rehab Potential Good    Clinical Decision Making Limited treatment options, no task modification necessary    Comorbidities Affecting Occupational Performance: None    Modification or Assistance to Complete Evaluation  No modification of tasks or assist necessary to complete eval    OT Frequency 2x / week    OT Duration 8 weeks   plan of care for 8 week - anticipate d/c earlier.   OT Treatment/Interventions Self-care/ADL training;Energy conservation;Patient/family education;Visual/perceptual remediation/compensation;Functional Mobility Training;Neuromuscular education;Therapeutic exercise;Cognitive remediation/compensation;Therapeutic activities;DME and/or AE instruction    Plan continue to work on environmental scanning and attention    Consulted and Agree with Plan of Care Patient             Patient will benefit from skilled therapeutic intervention in order to improve the following deficits and impairments:   Body Structure / Function / Physical Skills: ADL, Decreased knowledge of use of DME, FMC, Vision, IADL, ROM, UE functional use, GMC, Balance,  Strength Cognitive Skills: Attention, Safety Awareness, Problem Solve     Visit Diagnosis: Visuospatial deficit  Frontal lobe and executive function deficit  Attention and concentration deficit    Problem List Patient Active Problem List   Diagnosis Date Noted   H/O meningioma  of the brain 04/21/2021   Brain tumor [facility]) 02/20/2021   Essential hypertension, benign 03/25/2014    Carey Bullocks, OTR/L 04/22/2021, 10:02 AM  [facility] [address] [place], [place], [address] Phone: [phone]   Fax:  [phone]  Name: Barbara Kim MRN: [id number] Date of Birth: Apr 08, 1956

## 2021-04-27 ENCOUNTER — Ambulatory Visit: Payer: Medicare HMO

## 2021-04-27 ENCOUNTER — Telehealth: Payer: Self-pay | Admitting: Family Medicine

## 2021-04-27 ENCOUNTER — Ambulatory Visit: Payer: Medicare HMO | Admitting: Occupational Therapy

## 2021-04-27 ENCOUNTER — Other Ambulatory Visit: Payer: Self-pay

## 2021-04-27 ENCOUNTER — Encounter: Payer: Self-pay | Admitting: Physical Therapy

## 2021-04-27 ENCOUNTER — Ambulatory Visit: Payer: Medicare HMO | Attending: Neurosurgery | Admitting: Physical Therapy

## 2021-04-27 DIAGNOSIS — R41840 Attention and concentration deficit: Secondary | ICD-10-CM | POA: Insufficient documentation

## 2021-04-27 DIAGNOSIS — R2689 Other abnormalities of gait and mobility: Secondary | ICD-10-CM | POA: Diagnosis not present

## 2021-04-27 DIAGNOSIS — R41844 Frontal lobe and executive function deficit: Secondary | ICD-10-CM | POA: Diagnosis not present

## 2021-04-27 DIAGNOSIS — R4701 Aphasia: Secondary | ICD-10-CM | POA: Insufficient documentation

## 2021-04-27 DIAGNOSIS — M6281 Muscle weakness (generalized): Secondary | ICD-10-CM | POA: Insufficient documentation

## 2021-04-27 DIAGNOSIS — R41842 Visuospatial deficit: Secondary | ICD-10-CM | POA: Diagnosis not present

## 2021-04-27 DIAGNOSIS — R41841 Cognitive communication deficit: Secondary | ICD-10-CM

## 2021-04-27 DIAGNOSIS — R2681 Unsteadiness on feet: Secondary | ICD-10-CM | POA: Insufficient documentation

## 2021-04-27 NOTE — Telephone Encounter (Signed)
PT spouse called to get lab results

## 2021-04-27 NOTE — Therapy (Signed)
[facility] [address] [place], [place], [address] Phone: [phone]   Fax:  [phone]  Physical Therapy Treatment/RECERT/9th VISIT PROGRESS NOTE  Patient Details  Name: Barbara Kim MRN: [id number] Date of Birth: 03-23-1956 Referring Provider (PT): Kathyrn Sheriff  Please see clinical impression statement for progress note details.  Encounter Date: 04/27/2021   PT End of Session - 04/27/21 1152     Visit Number 9    Number of Visits 17    Date for PT Re-Evaluation 05/22/21    Authorization Type [employer] Medicare-Auth required (Codes [phone] and [phone] not covered)    PT Start Time 1104    PT Stop Time 1146    PT Time Calculation (min) 42 min    Activity Tolerance Patient tolerated treatment well    Behavior During Therapy WFL for tasks assessed/performed             Past Medical History:  Diagnosis Date   Chicken pox    Hypertension    Hyperthyroidism    25 years ago    Past Surgical History:  Procedure Laterality Date   APPLICATION OF CRANIAL NAVIGATION Left 02/20/2021   Procedure: APPLICATION OF CRANIAL NAVIGATION;  Surgeon: Consuella Lose, MD;  Location: [facility];  Service: Neurosurgery;  Laterality: Left;   CRANIOTOMY Left 02/20/2021   Procedure: Stereotactic LEFT pterional craniotomy for resection of meningioma;  Surgeon: Consuella Lose, MD;  Location: [facility];  Service: Neurosurgery;  Laterality: Left;   TONSILLECTOMY  1983    There were no vitals filed for this visit.   Subjective Assessment - 04/27/21 1108     Subjective No changes, no falls.    Pertinent History hyperthyroidism, HTN    Patient Stated Goals Pt's goals for physical therapy are to improve balance.    Currently in Pain? No/denies    Pain Onset 1 to 4 weeks ago                [facility] PT Assessment - 04/27/21 0001       Functional Gait  Assessment   Gait assessed  Yes    Gait Level Surface Walks 20 ft in less than 5.5  sec, no assistive devices, good speed, no evidence for imbalance, normal gait pattern, deviates no more than 6 in outside of the 12 in walkway width.   5.22   Change in Gait Speed Able to smoothly change walking speed without loss of balance or gait deviation. Deviate no more than 6 in outside of the 12 in walkway width.    Gait with Horizontal Head Turns Performs head turns smoothly with no change in gait. Deviates no more than 6 in outside 12 in walkway width    Gait with Vertical Head Turns Performs head turns with no change in gait. Deviates no more than 6 in outside 12 in walkway width.    Gait and Pivot Turn Pivot turns safely within 3 sec and stops quickly with no loss of balance.    Step Over Obstacle Is able to step over one shoe box (4.5 in total height) without changing gait speed. No evidence of imbalance.    Gait with Narrow Base of Support Is able to ambulate for 10 steps heel to toe with no staggering.    Gait with Eyes Closed Walks 20 ft, no assistive devices, good speed, no evidence of imbalance, normal gait pattern, deviates no more than 6 in outside 12 in walkway width. Ambulates 20 ft in less than 7  sec.    Ambulating Backwards Walks 20 ft, no assistive devices, good speed, no evidence for imbalance, normal gait    Steps Alternating feet, no rail.    Total Score 29    FGA comment: Improved from 26/30                           [facility] Adult PT Treatment/Exercise - 04/27/21 0001       Ambulation/Gait   Ambulation/Gait Yes    Ambulation/Gait Assistance 5: Supervision    Ambulation/Gait Assistance Details 6 minutes of gait    Ambulation Distance (Feet) 1599 Feet    Assistive device None    Gait Pattern Step-through pattern;Decreased arm swing - right;Decreased arm swing - left;Wide base of support    Ambulation Surface Level;Indoor    Gait Comments Gait with conversation x 6 minutes; pt has one episode of veering into furniture on L side.  Gait with  environmental scanning tasks      Self-Care   Self-Care Other Self-Care Comments    Other Self-Care Comments  Discussed POC and progress towards goals.  Pt pleased with improvements in mobility, but per her report of current exercise routine, she has not yet progressed to baseline.  She is currently walking 2x/wk and just started back to modified yoga 1-2x/wk.  She is in agreement to add to POC for 1-2 additional visits to review HEP upgrades and progression of community fitness.                 Balance Exercises - 04/27/21 0001       Balance Exercises: Standing   Step Ups Forward;Lateral;6 inch;Limitations    Step Ups Limitations Forward step up/up, down/down x 10, then single limb step ups x 15 reps. Lateral step ups x 15. no UE support today    Other Standing Exercises Forward step downs, x 15 reps each leg leading, minimal UE support               PT Education - 04/27/21 1152     Education Details Addition to HEP; discussed POC (recert today to cover 1 additional scheduled week)    Person(s) Educated Patient    Methods Explanation;Demonstration;Handout    Comprehension Verbalized understanding;Returned demonstration              PT Short Term Goals - 04/15/21 1107       PT SHORT TERM GOAL #1   Title Pt will be independent with HEP for improved strength, balance, transfers, and gait.  TARGET 04/10/21    Baseline 04/15/21: met with current program    Status Achieved      PT SHORT TERM GOAL #2   Title Pt will improve FGA score to at least 22/30 to decrease fall risk    Baseline 04/15/21: 26/30 scored today    Time --    Period --    Status Achieved      PT SHORT TERM GOAL #3   Title Pt will improve SLS LLE to 10 seconds for improved overall balance and stability.    Baseline 04/15/21: met in session today    Time --    Period --    Status Achieved               PT Long Term Goals - 04/27/21 1129       PT LONG TERM GOAL #1   Title Pt will be  independent with HEP for improved  strength, balance, transfers, and gait.  TARGET 05/08/2021    Time 8    Period Weeks    Status On-going      PT LONG TERM GOAL #2   Title Pt will improve FGA score to at least 25/30 to decrease fall risk.    Baseline 04/15/21: 26/30 scored in session today; 29/30 04/27/21    Status Achieved      PT LONG TERM GOAL #3   Title Pt will improve 6 MWT to at least 1500 ft for improved gait efficiency and endurance.    Baseline 1319 ft> 1599 ft 04/27/2021    Time 8    Period Weeks    Status Achieved      PT LONG TERM GOAL #4   Title Pt will verbalize plans for return to community fitness upon d/c from PT.    Baseline Has performed yoga 1-2 x in past week; currently walking 2x/wk 04/27/2021    Time 8    Period Weeks    Status On-going              New LTGs for recert:  PT Long Term Goals - 04/27/21 1201       PT LONG TERM GOAL #1   Title Pt will be independent with final progression of HEP for improved strength, balance, transfers, and gait.  TARGET 05/22/2021    Time 2    Period Weeks    Status On-going      PT LONG TERM GOAL #2   Title Pt will report at least 50% improvement of community/ongoing fitness activities back to baseline (walking, yoga, etc)    Time 2    Period Weeks    Status New                 Plan - 04/27/21 1153     Clinical Impression Statement Recert/9th Visit [place] Note completed today, covering dates from 03/11/21-04/27/2021.  Assessed FGA today, with pt improving to 29/30 (initial 19/30>26/30, now 29/30).  Assessed 6 minute walk today, with pt ambulating 1599 ft in 6 minutes (improved from 1319 ft at baseline).  Pt has met LTG 2 and 3.  Pt has continued to progress with high level balance and strength.  She continues to demonstrate mild LOB and trunk sway with single limb stance, and she is not yet back to prior baseline level with community fitness.  Please note modified LTGs for HEP and progression to ongoing community  fitness.    Personal Factors and Comorbidities Comorbidity 2    Comorbidities PMH: hyperthyroidism, HTN    Examination-Activity Limitations Locomotion Level;Stand    Examination-Participation Restrictions Community Activity;Occupation   Insurance account manager Stable/Uncomplicated    Rehab Potential Good    PT Frequency 2x / week    PT Duration 8 weeks   plus eval   PT Treatment/Interventions ADLs/Self Care Home Management;Gait training;Functional mobility training;Stair training;Therapeutic activities;Therapeutic exercise;Balance training;Neuromuscular re-education;Patient/family education    PT Next Visit Plan Review/upgrade HEP; work on SLS and hip/trunk stability.  Ask about return to community walking and modified yoga.  Plan for d/c after next week's PT visits.    Consulted and Agree with Plan of Care Patient             Patient will benefit from skilled therapeutic intervention in order to improve the following deficits and impairments:  Abnormal gait, Difficulty walking, Decreased endurance, Decreased balance, Decreased mobility  Visit Diagnosis: Other abnormalities of gait and mobility  Unsteadiness on feet  Muscle weakness (generalized)     Problem List Patient Active Problem List   Diagnosis Date Noted   H/O meningioma of the brain 04/21/2021   Brain tumor ([place]) 02/20/2021   Essential hypertension, benign 03/25/2014    Setsuko Robins W. 04/27/2021, 11:59 AM Frazier Butt., PT  [facility] [address] [place], [place], [address] Phone: [phone]   Fax:  [phone]  Name: Barbara Kim MRN: [id number] Date of Birth: Jul 02, 1956

## 2021-04-27 NOTE — Therapy (Signed)
[facility] [address] [place], [place], [address] Phone: [phone]   Fax:  [phone]  Speech Language Pathology Treatment  Patient Details  Name: Barbara Kim MRN: [id number] Date of Birth: [birth year] Referring Provider (SLP): Consuella Lose, MD   Encounter Date: 04/27/2021   End of Session - 04/27/21 1146     Visit Number 9    Number of Visits 17    Date for SLP Re-Evaluation 05/06/21    Authorization Type [employer] Medicare    SLP Start Time 1150    SLP Stop Time  1230    SLP Time Calculation (min) 40 min    Activity Tolerance Patient tolerated treatment well             Past Medical History:  Diagnosis Date   Chicken pox    Hypertension    Hyperthyroidism    25 years ago    Past Surgical History:  Procedure Laterality Date   APPLICATION OF CRANIAL NAVIGATION Left 02/20/2021   Procedure: APPLICATION OF CRANIAL NAVIGATION;  Surgeon: Consuella Lose, MD;  Location: [facility];  Service: Neurosurgery;  Laterality: Left;   CRANIOTOMY Left 02/20/2021   Procedure: Stereotactic LEFT pterional craniotomy for resection of meningioma;  Surgeon: Consuella Lose, MD;  Location: [facility];  Service: Neurosurgery;  Laterality: Left;   TONSILLECTOMY  1983    There were no vitals filed for this visit.   Subjective Assessment - 04/27/21 1147     Subjective "I did my homework. It was a lot of writing."    Currently in Pain? No/denies                   ADULT SLP TREATMENT - 04/27/21 1145       General Information   Behavior/Cognition Alert;Cooperative;Pleasant mood      Treatment Provided   Treatment provided Cognitive-Linquistic      Cognitive-Linquistic Treatment   Treatment focused on Aphasia;Cognition;Patient/family/caregiver education    Skilled Treatment Pt completed HEP, in which pt reported task was challenging for generating sentences with multisyllabic words. Pt generated appropriate  sentences with good comprehension of words exhibited. If questionable comprehension reported, pt endorsed looking up definition to aid understanding. Pt is relying on writing down information to aid recall. Pt verbalized some incidences of reduced auditory comprehension secondary to attention. SLP provided communication techniques to aid processing and comprehension, including using eye contact, facing speaker, and requesting clarifications and repetition as needed.      Assessment / Recommendations / Plan   Plan Continue with current plan of care      Progression Toward Goals   Progression toward goals Progressing toward goals              SLP Education - 04/27/21 1222     Education Details attention and comprehension techniques    Person(s) Educated Patient    Methods Explanation;Demonstration;Handout    Comprehension Verbalized understanding;Returned demonstration;Need further instruction              SLP Short Term Goals - 04/27/21 1146       SLP SHORT TERM GOAL #1   Title Pt will complete aphasia HEP with occasional min A over 2 sessions    Baseline 03-23-21, 04-15-21    Status Achieved      SLP SHORT TERM GOAL #2   Title Pt will verbally ID and correct word finding in paragraph level reading or picture description tasks with min A over 2 sessions  Baseline 04-20-21, 04-22-21    Status Achieved      SLP SHORT TERM GOAL #3   Title Pt will use word finding compensations in 10 minute mod complex conversation with occasional min A over 2 sessions    Baseline 04-15-21, 04-27-21    Status Achieved      SLP SHORT TERM GOAL #4   Title Pt will answer (yes/no/WH-) questions regarding paragraph length information with 80% accuracy given occasional cues over 2 sessions    Baseline 04-15-21, 04-22-21    Status Achieved      SLP SHORT TERM GOAL #5   Title Pt will complete communication PROM in first 1-2 sessions    Baseline Communicative Participation Item Bank= 6    Status  Achieved              SLP Long Term Goals - 04/27/21 1146       SLP LONG TERM GOAL #1   Title Pt will complete aphasia HEP with rare min A over 2 sessions    Baseline 04-22-21    Time 4    Period Weeks    Status On-going      SLP LONG TERM GOAL #2   Title Pt will use word finding compensations in 15+ minute mod complex conversation with rare min A over 2 sessions    Time 4    Period Weeks    Status On-going      SLP LONG TERM GOAL #3   Title Pt will demonstrate comprehension of multi-paragraph length information with 80% accuracy over 2 sessions    Baseline 04-15-21    Time 4    Period Weeks    Status On-going      SLP LONG TERM GOAL #4   Title Pt will report improved communication effectiveness via communication PROM by last ST session    Time 4    Period Weeks    Status On-going              Plan - 04/27/21 1223     Clinical Impression Statement Barbara Mola "Bett" presents with mild expressive and receptive aphasia secondary to resection of meingioma. Surgery completed 02-20-21. SLP reviewed communication techniques and modifications to aid auditory comprehension possibly secondary to attention. See "skilled treatment" for additional details. Skilled ST is warranted to address mild aphasia impacting pt's communication effectiveness and to facilitate with return to PLOF.    Speech Therapy Frequency 2x / week    Duration 8 weeks    Treatment/Interventions Functional tasks;Patient/family education;Language facilitation;Compensatory techniques;Internal/external aids;SLP instruction and feedback;Multimodal communcation approach;Compensatory strategies    Potential to Achieve Goals Good    SLP Home Exercise Plan provided    Consulted and Agree with Plan of Care Patient             Patient will benefit from skilled therapeutic intervention in order to improve the following deficits and impairments:   Aphasia  Cognitive communication deficit    Problem  List Patient Active Problem List   Diagnosis Date Noted   H/O meningioma of the brain 04/21/2021   Brain tumor ([place]) 02/20/2021   Essential hypertension, benign 03/25/2014    Alinda Deem, [place] CCC-SLP 04/27/2021, 2:30 PM  [facility] [address] [place], [place], [address] Phone: [phone]   Fax:  [phone]   Name: Barbara Kim MRN: [id number] Date of Birth: 10/17/1955

## 2021-04-27 NOTE — Patient Instructions (Signed)
Access Code: [email or web address] URL: [email or web address] Date: 04/27/2021 Prepared by: Mady Haagensen  Exercises Walking Tandem Stance - 1 x daily - 5 x weekly - 1 sets - 3 reps Sidestepping - 1 x daily - 5 x weekly - 1 sets - 3 reps Standing Balance in Corner - 1 x daily - 5 x weekly - 1 sets - 10 reps Standing Balance in Corner with Eyes Closed - 1 x daily - 5 x weekly - 1 sets - 10 reps Romberg Stance on Foam Pad - 1 x daily - 5 x weekly - 1 sets - 10 reps Single Leg Stance with Support - 1 x daily - 5 x weekly - 1 sets - 5-10 reps Alternating Step Taps with Counter Support - 1 x daily - 5 x weekly - 1 sets - 5-10 reps  Added 04/27/2021: Forward Step Down with Counter Support at Side - 1 x daily - 5 x weekly - 2 sets - 10 reps

## 2021-04-27 NOTE — Telephone Encounter (Signed)
See result notes. 

## 2021-04-27 NOTE — Patient Instructions (Signed)
-  Explain to family and friends if you need more time to process and understand. You can ask them to repeat, clarify what they mean, or alert them if you lose focus.   -If you are having trouble concentrating on multiple things at once, know it's okay to only focus on one thing at a time right now. Family and friends can write down information if needed.   -When you are in conversation, think about having good eye contact, facing each other, and reducing other distractions to help your focus.   Strategies for Improving Your Attention and Memory  Use good eye-contact Give the speaker your undivided attention Look directly at the speaker  Complete one task at a time Avoid multitasking Complete one task before starting a new one Write a note to yourself if you think of something else that needs to be done Let others know when you need quiet time and can't be interrupted Don't answer the phone, texts, or emails while you are working on another task  Put aside distracting thoughts If you find your mind wandering, refocus your attention on the speaker Avoid off-topic comments or responses that may divert your attention  If something important comes to mind, let the speaker know and pause to write yourself a note: "Do you mind holding on a minute, I have to write something down." Put thoughts on hold and focus on salient information  Limit distractions in your environment Think about the environment around you Limit background noise by turning off the TV or music, putting your phone away Close the door and work in quiet  Use active listening Actively participate in the conversation to stay focused Paraphrase what you have heard to include the most important details Adding some associations may help you remember Ask questions to clarify certain points Summarize the speaker's comments periodically Avoid nodding your head and using "mhm" responses as these are more passive and don't help  your attention  Alert the other person/people It may be helpful to alert your listener to the fact that you may need reminders to keep on track Tell the speaker in advance that you may need to stop them and have them repeat salient information If you lose focus, interject and let the person know, "I'm sorry, I lost you, can you tell me again?"  Write down information Write down pertinent information as it comes up, such as telephone numbers, names of people, addresses, details from appointments and conversations, etc.

## 2021-04-29 ENCOUNTER — Ambulatory Visit: Payer: Medicare HMO | Admitting: Occupational Therapy

## 2021-04-29 ENCOUNTER — Other Ambulatory Visit: Payer: Self-pay

## 2021-04-29 ENCOUNTER — Encounter: Payer: Self-pay | Admitting: Occupational Therapy

## 2021-04-29 DIAGNOSIS — M6281 Muscle weakness (generalized): Secondary | ICD-10-CM

## 2021-04-29 DIAGNOSIS — R41840 Attention and concentration deficit: Secondary | ICD-10-CM | POA: Diagnosis not present

## 2021-04-29 DIAGNOSIS — R4701 Aphasia: Secondary | ICD-10-CM | POA: Diagnosis not present

## 2021-04-29 DIAGNOSIS — R41844 Frontal lobe and executive function deficit: Secondary | ICD-10-CM | POA: Diagnosis not present

## 2021-04-29 DIAGNOSIS — R2689 Other abnormalities of gait and mobility: Secondary | ICD-10-CM

## 2021-04-29 DIAGNOSIS — R2681 Unsteadiness on feet: Secondary | ICD-10-CM

## 2021-04-29 DIAGNOSIS — R41841 Cognitive communication deficit: Secondary | ICD-10-CM | POA: Diagnosis not present

## 2021-04-29 DIAGNOSIS — R41842 Visuospatial deficit: Secondary | ICD-10-CM

## 2021-04-29 NOTE — Therapy (Signed)
[facility] [address] [place], [place], [address] Phone: [phone]   Fax:  [phone]  Occupational Therapy Treatment  Patient Details  Name: Barbara Kim MRN: [id number] Date of Birth: October 31, 1955 Referring Provider (OT): Sherron Ales   Encounter Date: 04/29/2021   OT End of Session - 04/29/21 1021     Visit Number 8    Number of Visits 17   8 weeks plan of care - anticipate discharge earlier   Date for OT Re-Evaluation 05/07/21    Authorization Type [employer]    Authorization Time Period $35 copay per day    Authorization - Visit Number 7   corrected number   Authorization - Number of Visits 10    OT Start Time 1018    OT Stop Time 1100    OT Time Calculation (min) 42 min    Activity Tolerance Patient tolerated treatment well    Behavior During Therapy [facility] for tasks assessed/performed             Past Medical History:  Diagnosis Date   Chicken pox    Hypertension    Hyperthyroidism    25 years ago    Past Surgical History:  Procedure Laterality Date   APPLICATION OF CRANIAL NAVIGATION Left 02/20/2021   Procedure: APPLICATION OF CRANIAL NAVIGATION;  Surgeon: Consuella Lose, MD;  Location: [facility];  Service: Neurosurgery;  Laterality: Left;   CRANIOTOMY Left 02/20/2021   Procedure: Stereotactic LEFT pterional craniotomy for resection of meningioma;  Surgeon: Consuella Lose, MD;  Location: [facility];  Service: Neurosurgery;  Laterality: Left;   TONSILLECTOMY  1983    There were no vitals filed for this visit.   Subjective Assessment - 04/29/21 1020     Subjective  "I'm doing good, I'm glad we have this. My car is getting a physical and then after that I am good to drive. Dr. Carroll Kinds said I am good to go"    Pertinent History PMH: hyperthyroidism, HTN    Limitations Fall Risk. No driving.    Patient Stated Goals "go back and enjoy reading again"    Currently in Pain? No/denies                           OT Treatments/Exercises (OP) - 04/29/21 1041       Visual/Perceptual Exercises   Copy this Image Other    Other Tanagrams - directly on top of pattern and with near point copying with increased ease and skill as activity went on. Pt req'd mod A at beginning of task with directly placing on pattern and did not req assistance    Scanning Environmental    Scanning - Environmental 12/16 75% accuracy with first pass. Pt required increased time for second pass but with greater attention able to locate remaining 4                      OT Short Term Goals - 04/15/21 1150       OT SHORT TERM GOAL #1   Title Pt/caregiver will verbalize understanding of memory compensation strategies    Time 4    Period Weeks    Status Achieved    Target Date 04/10/21      OT SHORT TERM GOAL #2   Title Pt will attend to a novel cognitive task for 5 minutes with no cue for redirection    Time 4  Period Weeks    Status Achieved      OT SHORT TERM GOAL #3   Title Pt will be independent with diplopia HEP    Time 4    Period Weeks    Status Achieved      OT SHORT TERM GOAL #4   Title Pt will complete environmental scanning with 90% accuracy or greater    Time 4    Period Weeks    Status On-going   87% on 03/23/21              OT Long Term Goals - 04/29/21 1022       OT LONG TERM GOAL #1   Title Pt will report significant decrease in diplopia with watching TV and reading and other daily activities.    Time 8    Period Weeks    Status Achieved   only reports it at night/end of the day. 03/25/21     OT LONG TERM GOAL #2   Title Pt will perform physical and cognitive task simultaneously with 90% accuracy or greater    Time 8    Period Weeks    Status On-going   approx 80% accuracy (min to mod questioning cues/prompts for cognitive task)     OT LONG TERM GOAL #3   Title Pt will plan and execute a meal for her spouse and herself with  supervision and good safety awareness    Time 8    Period Weeks    Status Achieved      OT LONG TERM GOAL #4   Title Pt will verbalize understanding of cognitive and adapted strategies and equipment PRN for increased safety and independence with ADLs and IADLs.    Time 8    Period Weeks    Status On-going                   Plan - 04/29/21 1053     Clinical Impression Statement Pt reports getting back to cooking at home. Pt continues to have decreased attention with environmental scanning but improved.    OT Occupational Profile and History Problem Focused Assessment - Including review of records relating to presenting problem    Occupational performance deficits (Please refer to evaluation for details): IADL's;ADL's;Leisure;Work    Marketing executive / Function / Physical Skills ADL;Decreased knowledge of use of DME;FMC;Vision;IADL;ROM;UE functional use;GMC;Balance;Strength    Cognitive Skills Attention;Safety Awareness;Problem Solve    Rehab Potential Good    Clinical Decision Making Limited treatment options, no task modification necessary    Comorbidities Affecting Occupational Performance: None    Modification or Assistance to Complete Evaluation  No modification of tasks or assist necessary to complete eval    OT Frequency 2x / week    OT Duration 8 weeks   plan of care for 8 week - anticipate d/c earlier.   OT Treatment/Interventions Self-care/ADL training;Energy conservation;Patient/family education;Visual/perceptual remediation/compensation;Functional Mobility Training;Neuromuscular education;Therapeutic exercise;Cognitive remediation/compensation;Therapeutic activities;DME and/or AE instruction    Plan continue to work on environmental scanning and attention, renewal next session    Consulted and Agree with Plan of Care Patient             Patient will benefit from skilled therapeutic intervention in order to improve the following deficits and impairments:   Body  Structure / Function / Physical Skills: ADL, Decreased knowledge of use of DME, FMC, Vision, IADL, ROM, UE functional use, GMC, Balance, Strength Cognitive Skills: Attention, Safety Awareness, Problem Solve  Visit Diagnosis: Other abnormalities of gait and mobility  Unsteadiness on feet  Visuospatial deficit  Muscle weakness (generalized)  Frontal lobe and executive function deficit  Attention and concentration deficit    Problem List Patient Active Problem List   Diagnosis Date Noted   H/O meningioma of the brain 04/21/2021   Brain tumor ([place]) 02/20/2021   Essential hypertension, benign 03/25/2014    Zachery Conch MOT, OTR/L  04/29/2021, 1:25 PM  [facility] [address] [place], [place], [address] Phone: [phone]   Fax:  [phone]  Name: Barbara Kim MRN: [id number] Date of Birth: 22-Nov-1955

## 2021-05-04 ENCOUNTER — Other Ambulatory Visit: Payer: Self-pay

## 2021-05-04 ENCOUNTER — Ambulatory Visit: Payer: Medicare HMO

## 2021-05-04 DIAGNOSIS — R41841 Cognitive communication deficit: Secondary | ICD-10-CM | POA: Diagnosis not present

## 2021-05-04 DIAGNOSIS — R41840 Attention and concentration deficit: Secondary | ICD-10-CM | POA: Diagnosis not present

## 2021-05-04 DIAGNOSIS — R4701 Aphasia: Secondary | ICD-10-CM | POA: Diagnosis not present

## 2021-05-04 DIAGNOSIS — R41844 Frontal lobe and executive function deficit: Secondary | ICD-10-CM | POA: Diagnosis not present

## 2021-05-04 DIAGNOSIS — R41842 Visuospatial deficit: Secondary | ICD-10-CM | POA: Diagnosis not present

## 2021-05-04 DIAGNOSIS — M6281 Muscle weakness (generalized): Secondary | ICD-10-CM | POA: Diagnosis not present

## 2021-05-04 DIAGNOSIS — R2689 Other abnormalities of gait and mobility: Secondary | ICD-10-CM | POA: Diagnosis not present

## 2021-05-04 DIAGNOSIS — R2681 Unsteadiness on feet: Secondary | ICD-10-CM | POA: Diagnosis not present

## 2021-05-04 NOTE — Therapy (Signed)
[facility] [address] [place], [place], [address] Phone: [phone]   Fax:  [phone]  Speech Language Pathology Treatment/Recertification/Progress Note  Patient Details  Name: Barbara Kim MRN: [id number] Date of Birth: [birth year] Referring Provider (SLP): Consuella Lose, MD   Encounter Date: 05/04/2021   End of Session - 05/04/21 1531     Visit Number 10    Number of Visits 18   re-cert for 2x/week for 4 more weeks   Date for SLP Re-Evaluation 06/01/21    Authorization Type [employer] Medicare    SLP Start Time 1530    SLP Stop Time  1615    SLP Time Calculation (min) 45 min    Activity Tolerance Patient tolerated treatment well             Past Medical History:  Diagnosis Date   Chicken pox    Hypertension    Hyperthyroidism    25 years ago    Past Surgical History:  Procedure Laterality Date   APPLICATION OF CRANIAL NAVIGATION Left 02/20/2021   Procedure: APPLICATION OF CRANIAL NAVIGATION;  Surgeon: Consuella Lose, MD;  Location: [facility];  Service: Neurosurgery;  Laterality: Left;   CRANIOTOMY Left 02/20/2021   Procedure: Stereotactic LEFT pterional craniotomy for resection of meningioma;  Surgeon: Consuella Lose, MD;  Location: [facility];  Service: Neurosurgery;  Laterality: Left;   TONSILLECTOMY  1983    There were no vitals filed for this visit.   Speech Therapy Progress Note  Dates of Reporting Period: 03-11-21 to current  Objective Reports of Subjective Statement: Pt reports improvements in mild expressive and receptive aphasia.   Objective Measurements: Pt presents will less frequent anomia/dysnomia on structured speech tasks and in conversation. Pt continues to present with reduced auditory and written language comprehension and will continue to be targeted in upcoming ST sessions.   Goal Update: see revised goals below  Plan: continue per POC  Reason Skilled Services are  Required: pt has not yet met rehab potential at this time       ADULT SLP TREATMENT - 05/04/21 1530       General Information   Behavior/Cognition Alert;Cooperative;Pleasant mood      Treatment Provided   Treatment provided Cognitive-Linquistic      Cognitive-Linquistic Treatment   Treatment focused on Aphasia;Cognition;Patient/family/caregiver education    Skilled Treatment Pt completed HEP; however, pt misunderstood instructions requiring SLP clarification and modeling. With occasional  min A, pt used targeted words in short paragraphs. Pt exhibited inconsistent awareness of missing words in written story and incorrect syntax; however, syntax may be impacted by primary language. No overt anomia or dysnomia exhibited this session in conversation. SLP to continue to target expressive and receptive aphasia.      Assessment / Recommendations / Plan   Plan Continue with current plan of care;Goals updated      Progression Toward Goals   Progression toward goals Progressing toward goals              SLP Education - 05/04/21 1606     Education Details double check, mental flexability    Person(s) Educated Patient    Methods Explanation;Demonstration    Comprehension Verbalized understanding;Returned demonstration;Need further instruction              SLP Short Term Goals - 04/27/21 1146       SLP SHORT TERM GOAL #1   Title Pt will complete aphasia HEP with occasional min A over 2 sessions  Baseline 03-23-21, 04-15-21    Status Achieved      SLP SHORT TERM GOAL #2   Title Pt will verbally ID and correct word finding in paragraph level reading or picture description tasks with min A over 2 sessions    Baseline 04-20-21, 04-22-21    Status Achieved      SLP SHORT TERM GOAL #3   Title Pt will use word finding compensations in 10 minute mod complex conversation with occasional min A over 2 sessions    Baseline 04-15-21, 04-27-21    Status Achieved      SLP SHORT TERM  GOAL #4   Title Pt will answer (yes/no/WH-) questions regarding paragraph length information with 80% accuracy given occasional cues over 2 sessions    Baseline 04-15-21, 04-22-21    Status Achieved      SLP SHORT TERM GOAL #5   Title Pt will complete communication PROM in first 1-2 sessions    Baseline Communicative Participation Item Bank= 6    Status Achieved              SLP Long Term Goals - 05/04/21 1533       SLP LONG TERM GOAL #1   Title Pt will complete aphasia HEP with rare min A over 2 sessions    Baseline 04-22-21    Time 3    Period Weeks    Status On-going      SLP LONG TERM GOAL #2   Title Pt will use word finding compensations in 15+ minute mod complex conversation with rare min A over 2 sessions    Baseline 05-04-21    Time 3    Period Weeks    Status On-going      SLP LONG TERM GOAL #3   Title Pt will demonstrate comprehension of multi-paragraph length information, written instructions, and auditory information within conversation with 80% accuracy over 2 sessions    Baseline 04-15-21    Time 4    Period Weeks    Status Revised      SLP LONG TERM GOAL #4   Title Pt will report improved communication effectiveness via communication PROM by 2 points at last ST session    Time 4    Period Weeks    Status Revised              Plan - 05/04/21 1610     Clinical Impression Statement Barbara Mola "Bett" presents with improving mild expressive and receptive aphasia secondary to resection of meingioma. Surgery completed 02-20-21. Pt is continuing to present with reduced comprehension and occasional anomia/dysnomia. SLP recommended re-reading instructions and double checking to ensure comprehension. POC updated to continue 2x/week for 4 more weeks as pt is continuing to make progress. See "skilled treatment" for additional details. Skilled ST is warranted to address mild aphasia impacting pt's communication effectiveness and to facilitate with return to PLOF.     Speech Therapy Frequency 2x / week    Duration 4 weeks    Treatment/Interventions Functional tasks;Patient/family education;Language facilitation;Compensatory techniques;Internal/external aids;SLP instruction and feedback;Multimodal communcation approach;Compensatory strategies    Potential to Achieve Goals Good    SLP Home Exercise Plan provided    Consulted and Agree with Plan of Care Patient             Patient will benefit from skilled therapeutic intervention in order to improve the following deficits and impairments:   Aphasia  Cognitive communication deficit    Problem List Patient Active Problem List  Diagnosis Date Noted   H/O meningioma of the brain 04/21/2021   Brain tumor ([place]) 02/20/2021   Essential hypertension, benign 03/25/2014    Alinda Deem, MA CCC-SLP 05/04/2021, 4:36 PM  [facility] [address] [place], [place], [address] Phone: [phone]   Fax:  [phone]   Name: Meosha Castanon MRN: [id number] Date of Birth: 03/21/1956

## 2021-05-06 ENCOUNTER — Ambulatory Visit: Payer: Medicare HMO

## 2021-05-06 ENCOUNTER — Other Ambulatory Visit: Payer: Self-pay

## 2021-05-06 DIAGNOSIS — R4701 Aphasia: Secondary | ICD-10-CM

## 2021-05-06 DIAGNOSIS — R2681 Unsteadiness on feet: Secondary | ICD-10-CM | POA: Diagnosis not present

## 2021-05-06 DIAGNOSIS — R41840 Attention and concentration deficit: Secondary | ICD-10-CM | POA: Diagnosis not present

## 2021-05-06 DIAGNOSIS — R41842 Visuospatial deficit: Secondary | ICD-10-CM | POA: Diagnosis not present

## 2021-05-06 DIAGNOSIS — M6281 Muscle weakness (generalized): Secondary | ICD-10-CM | POA: Diagnosis not present

## 2021-05-06 DIAGNOSIS — R41841 Cognitive communication deficit: Secondary | ICD-10-CM | POA: Diagnosis not present

## 2021-05-06 DIAGNOSIS — R41844 Frontal lobe and executive function deficit: Secondary | ICD-10-CM | POA: Diagnosis not present

## 2021-05-06 DIAGNOSIS — R2689 Other abnormalities of gait and mobility: Secondary | ICD-10-CM | POA: Diagnosis not present

## 2021-05-06 NOTE — Therapy (Signed)
[facility] [address] [place], [place], [address] Phone: [phone]   Fax:  [phone]  Speech Language Pathology Treatment  Patient Details  Name: Barbara Kim MRN: [id number] Date of Birth: 1956-04-10 Referring Provider (SLP): Consuella Lose, MD   Encounter Date: 05/06/2021   End of Session - 05/06/21 2122     Visit Number 11    Number of Visits 18    Date for SLP Re-Evaluation 06/01/21    Authorization Type [employer] Medicare    SLP Start Time 1533    SLP Stop Time  1615    SLP Time Calculation (min) 42 min    Activity Tolerance Patient tolerated treatment well             Past Medical History:  Diagnosis Date   Chicken pox    Hypertension    Hyperthyroidism    25 years ago    Past Surgical History:  Procedure Laterality Date   APPLICATION OF CRANIAL NAVIGATION Left 02/20/2021   Procedure: APPLICATION OF CRANIAL NAVIGATION;  Surgeon: Consuella Lose, MD;  Location: [facility];  Service: Neurosurgery;  Laterality: Left;   CRANIOTOMY Left 02/20/2021   Procedure: Stereotactic LEFT pterional craniotomy for resection of meningioma;  Surgeon: Consuella Lose, MD;  Location: [facility];  Service: Neurosurgery;  Laterality: Left;   TONSILLECTOMY  1983    There were no vitals filed for this visit.   Subjective Assessment - 05/06/21 1535     Subjective "I finished my homework the right way"    Currently in Pain? No/denies                   ADULT SLP TREATMENT - 05/06/21 1556       General Information   Behavior/Cognition Alert;Cooperative;Pleasant mood      Treatment Provided   Treatment provided Cognitive-Linquistic      Cognitive-Linquistic Treatment   Treatment focused on Aphasia;Cognition;Patient/family/caregiver education    Skilled Treatment Pt returned with HEP completed as instructed. SLP reviewed HEP, in which pt missed rare words x3 on task. Pt able to generate sentences  using targeted words with rare min A. Pt appreciated complexity of task targeting expressive language skills. Rare anomia exhibited x2 this session, which improved with extended processing time x1.      Assessment / Recommendations / Plan   Plan Continue with current plan of care      Progression Toward Goals   Progression toward goals Progressing toward goals              SLP Education - 05/06/21 2122     Education Details HEP, expressive and receptive language    Person(s) Educated Patient    Methods Explanation;Demonstration;Handout    Comprehension Verbalized understanding;Returned demonstration              SLP Short Term Goals - 04/27/21 1146       SLP SHORT TERM GOAL #1   Title Pt will complete aphasia HEP with occasional min A over 2 sessions    Baseline 03-23-21, 04-15-21    Status Achieved      SLP SHORT TERM GOAL #2   Title Pt will verbally ID and correct word finding in paragraph level reading or picture description tasks with min A over 2 sessions    Baseline 04-20-21, 04-22-21    Status Achieved      SLP SHORT TERM GOAL #3   Title Pt will use word finding compensations in 10 minute mod complex  conversation with occasional min A over 2 sessions    Baseline 04-15-21, 04-27-21    Status Achieved      SLP SHORT TERM GOAL #4   Title Pt will answer (yes/no/WH-) questions regarding paragraph length information with 80% accuracy given occasional cues over 2 sessions    Baseline 04-15-21, 04-22-21    Status Achieved      SLP SHORT TERM GOAL #5   Title Pt will complete communication PROM in first 1-2 sessions    Baseline Communicative Participation Item Bank= 6    Status Achieved              SLP Long Term Goals - 05/06/21 1548       SLP LONG TERM GOAL #1   Title Pt will complete aphasia HEP with rare min A over 2 sessions    Baseline 04-22-21, 05-06-21    Status Achieved      SLP LONG TERM GOAL #2   Title Pt will use word finding compensations in 15+  minute mod complex conversation with rare min A over 2 sessions    Baseline 05-04-21    Time 3    Period Weeks    Status On-going      SLP LONG TERM GOAL #3   Title Pt will demonstrate comprehension of multi-paragraph length information, written instructions, and auditory information within conversation with 80% accuracy over 2 sessions    Baseline 04-15-21    Time 4    Period Weeks    Status On-going      SLP LONG TERM GOAL #4   Title Pt will report improved communication effectiveness via communication PROM by 2 points at last ST session    Time 4    Period Weeks    Status On-going              Plan - 05/06/21 2122     Clinical Impression Statement Barbara Mola "Bett" presents with improving mild expressive and receptive aphasia secondary to resection of meingioma. Surgery completed 02-20-21. Pt exhibits intermittent reduced comprehension and rare anomia/dysnomia. Pt benefits from intermittent extended processing time to aid naming and re-reading instructions to improve comprehension. See "skilled treatment" for additional details. Skilled ST is warranted to address mild aphasia impacting pt's communication effectiveness and to facilitate with return to PLOF.    Speech Therapy Frequency 2x / week    Duration 4 weeks    Treatment/Interventions Functional tasks;Patient/family education;Language facilitation;Compensatory techniques;Internal/external aids;SLP instruction and feedback;Multimodal communcation approach;Compensatory strategies    Potential to Achieve Goals Good    SLP Home Exercise Plan provided    Consulted and Agree with Plan of Care Patient             Patient will benefit from skilled therapeutic intervention in order to improve the following deficits and impairments:   Aphasia  Cognitive communication deficit    Problem List Patient Active Problem List   Diagnosis Date Noted   H/O meningioma of the brain 04/21/2021   Brain tumor ([place]) 02/20/2021    Essential hypertension, benign 03/25/2014    Alinda Deem, MA CCC-SLP 05/06/2021, 9:25 PM  [facility] [address] [place], [place], [address] Phone: [phone]   Fax:  [phone]   Name: Barbara Kim MRN: [id number] Date of Birth: Feb 11, 1956

## 2021-05-13 ENCOUNTER — Ambulatory Visit: Payer: Medicare HMO | Admitting: Physical Therapy

## 2021-05-13 ENCOUNTER — Encounter: Payer: Self-pay | Admitting: Physical Therapy

## 2021-05-13 ENCOUNTER — Ambulatory Visit: Payer: Medicare HMO

## 2021-05-13 ENCOUNTER — Other Ambulatory Visit: Payer: Self-pay

## 2021-05-13 DIAGNOSIS — R41844 Frontal lobe and executive function deficit: Secondary | ICD-10-CM | POA: Diagnosis not present

## 2021-05-13 DIAGNOSIS — R2689 Other abnormalities of gait and mobility: Secondary | ICD-10-CM | POA: Diagnosis not present

## 2021-05-13 DIAGNOSIS — M6281 Muscle weakness (generalized): Secondary | ICD-10-CM | POA: Diagnosis not present

## 2021-05-13 DIAGNOSIS — R41840 Attention and concentration deficit: Secondary | ICD-10-CM | POA: Diagnosis not present

## 2021-05-13 DIAGNOSIS — R4701 Aphasia: Secondary | ICD-10-CM

## 2021-05-13 DIAGNOSIS — R41842 Visuospatial deficit: Secondary | ICD-10-CM | POA: Diagnosis not present

## 2021-05-13 DIAGNOSIS — R41841 Cognitive communication deficit: Secondary | ICD-10-CM | POA: Diagnosis not present

## 2021-05-13 DIAGNOSIS — R2681 Unsteadiness on feet: Secondary | ICD-10-CM | POA: Diagnosis not present

## 2021-05-13 NOTE — Patient Instructions (Addendum)
  Select 1-2 more topics to present on Friday   Only write bullet points to summarize your talking points so it can work your memory and word finding in the moment when you presenting

## 2021-05-13 NOTE — Therapy (Signed)
[facility] [address] [place], [place], [address] Phone: [phone]   Fax:  [phone]  Speech Language Pathology Treatment  Patient Details  Name: Barbara Kim MRN: [id number] Date of Birth: Mar 17, 1956 Referring Provider (SLP): Consuella Lose, MD   Encounter Date: 05/13/2021   End of Session - 05/13/21 1011     Visit Number 12    Number of Visits 18    Date for SLP Re-Evaluation 06/01/21    Authorization Type [employer]    SLP Start Time 1100    SLP Stop Time  1145    SLP Time Calculation (min) 45 min    Activity Tolerance Patient tolerated treatment well             Past Medical History:  Diagnosis Date   Chicken pox    Hypertension    Hyperthyroidism    25 years ago    Past Surgical History:  Procedure Laterality Date   APPLICATION OF CRANIAL NAVIGATION Left 02/20/2021   Procedure: APPLICATION OF CRANIAL NAVIGATION;  Surgeon: Consuella Lose, MD;  Location: [facility];  Service: Neurosurgery;  Laterality: Left;   CRANIOTOMY Left 02/20/2021   Procedure: Stereotactic LEFT pterional craniotomy for resection of meningioma;  Surgeon: Consuella Lose, MD;  Location: [facility];  Service: Neurosurgery;  Laterality: Left;   TONSILLECTOMY  1983    There were no vitals filed for this visit.   Subjective Assessment - 05/13/21 1105     Subjective "I didn't sleep well last night"    Currently in Pain? No/denies                   ADULT SLP TREATMENT - 05/13/21 1010       General Information   Behavior/Cognition Alert;Cooperative;Pleasant mood      Treatment Provided   Treatment provided Cognitive-Linquistic      Cognitive-Linquistic Treatment   Treatment focused on Aphasia;Cognition;Patient/family/caregiver education    Skilled Treatment Pt reports poor sleep last night and losing several people in her life recently which has impacted her cognitive functioning. SLP educated patient  on how emotions can impact cognitive functioning, with recommendations provided to aid attention. Pt verbalized understanding. Pt reports easing back into work and driving with supervision from husband. Pt plans to return to part employment beginning in September. SLP targeted 1-minute presentations, in which pt completed with no overt anomia or dysnomia as pt read presentation. SLP recommended patient use bullet points to structure thinking to target word finding and recall versus putting speech onto paper.      Assessment / Recommendations / Plan   Plan Continue with current plan of care      Progression Toward Goals   Progression toward goals Progressing toward goals              SLP Education - 05/13/21 1124     Education Details impact of emotions on cognitive functioning, bullet points, advocate for self and ask for repetition    Person(s) Educated Patient    Methods Explanation;Demonstration;Handout    Comprehension Verbalized understanding;Returned demonstration;Need further instruction              SLP Short Term Goals - 04/27/21 1146       SLP SHORT TERM GOAL #1   Title Pt will complete aphasia HEP with occasional min A over 2 sessions    Baseline 03-23-21, 04-15-21    Status Achieved      SLP SHORT TERM GOAL #2   Title  Pt will verbally ID and correct word finding in paragraph level reading or picture description tasks with min A over 2 sessions    Baseline 04-20-21, 04-22-21    Status Achieved      SLP SHORT TERM GOAL #3   Title Pt will use word finding compensations in 10 minute mod complex conversation with occasional min A over 2 sessions    Baseline 04-15-21, 04-27-21    Status Achieved      SLP SHORT TERM GOAL #4   Title Pt will answer (yes/no/WH-) questions regarding paragraph length information with 80% accuracy given occasional cues over 2 sessions    Baseline 04-15-21, 04-22-21    Status Achieved      SLP SHORT TERM GOAL #5   Title Pt will complete  communication PROM in first 1-2 sessions    Baseline Communicative Participation Item Bank= 6    Status Achieved              SLP Long Term Goals - 05/13/21 1011       SLP LONG TERM GOAL #1   Title Pt will complete aphasia HEP with rare min A over 2 sessions    Baseline 04-22-21, 05-06-21    Status Achieved      SLP LONG TERM GOAL #2   Title Pt will use word finding compensations in 15+ minute mod complex conversation with rare min A over 2 sessions    Baseline 05-04-21, 05-13-21    Time --    Period --    Status Achieved      SLP LONG TERM GOAL #3   Title Pt will demonstrate comprehension of multi-paragraph length information, written instructions, and auditory information within conversation with 80% accuracy over 2 sessions    Baseline 04-15-21    Time 3    Period Weeks    Status On-going      SLP LONG TERM GOAL #4   Title Pt will report improved communication effectiveness via communication PROM by 2 points at last ST session    Time 4    Period Weeks    Status On-going              Plan - 05/13/21 1012     Clinical Impression Statement [name] "Bett" presents with improving mild expressive and receptive aphasia secondary to resection of meingioma. Surgery completed 02-20-21. No overt anomia/dysnomia exhibited in conversation and structured speech tasks this session. Pt benefits from re-reading instructions to improve comprehension. See "skilled treatment" for additional details. Skilled ST is warranted to address mild aphasia impacting pt's communication effectiveness and to facilitate with return to PLOF.    Speech Therapy Frequency 2x / week    Duration 4 weeks    Treatment/Interventions Functional tasks;Patient/family education;Language facilitation;Compensatory techniques;Internal/external aids;SLP instruction and feedback;Multimodal communcation approach;Compensatory strategies    Potential to Achieve Goals Good    SLP Home Exercise Plan provided    Consulted  and Agree with Plan of Care Patient             Patient will benefit from skilled therapeutic intervention in order to improve the following deficits and impairments:   Aphasia  Cognitive communication deficit    Problem List Patient Active Problem List   Diagnosis Date Noted   H/O meningioma of the brain 04/21/2021   Brain tumor ([place]) 02/20/2021   Essential hypertension, benign 03/25/2014    Alinda Deem, MA CCC-SLP 05/13/2021, 11:50 AM  [facility] [address]  [place], [place], [address] Phone: [phone]   Fax:  [phone]   Name: Kellye Samp MRN: [id number] Date of Birth: 06/07/1956

## 2021-05-13 NOTE — Therapy (Signed)
[facility] [address] [place], [place], [address] Phone: [phone]   Fax:  [phone]  Physical Therapy Treatment  Patient Details  Name: Barbara Kim MRN: [id number] Date of Birth: 08-16-1956 Referring Provider (PT): Kathyrn Sheriff   Encounter Date: 05/13/2021   PT End of Session - 05/13/21 0901     Visit Number 10    Number of Visits 17    Date for PT Re-Evaluation 05/22/21    Authorization Type Aet[employer] [place] required (Codes [phone] and [phone] not covered)    Progress Note Due on Visit 42    PT Start Time [phone]   pt running late due to transportation   PT Stop Time 0929    PT Time Calculation (min) 33 min    Activity Tolerance Patient tolerated treatment well    Behavior During Therapy [facility] for tasks assessed/performed             Past Medical History:  Diagnosis Date   Chicken pox    Hypertension    Hyperthyroidism    25 years ago    Past Surgical History:  Procedure Laterality Date   APPLICATION OF CRANIAL NAVIGATION Left 02/20/2021   Procedure: APPLICATION OF CRANIAL NAVIGATION;  Surgeon: Consuella Lose, MD;  Location: [facility];  Service: Neurosurgery;  Laterality: Left;   CRANIOTOMY Left 02/20/2021   Procedure: Stereotactic LEFT pterional craniotomy for resection of meningioma;  Surgeon: Consuella Lose, MD;  Location: [facility];  Service: Neurosurgery;  Laterality: Left;   TONSILLECTOMY  1983    There were no vitals filed for this visit.   Subjective Assessment - 05/13/21 0858     Subjective No new complaints. 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 No falls. Has been doing the Yoga at home with videos, has not returned to classes as yet. Has also started walking with friends, they are thinking about doing the 5K in October.    Pertinent History hyperthyroidism, HTN    Patient  Stated Goals Pt's goals for physical therapy are to improve balance.    Currently in Pain? No/denies    Pain Score 0-No pain                   [facility] Adult PT Treatment/Exercise - 05/13/21 0902       Transfers   Transfers Sit to Stand;Stand to Sit    Sit to Stand 6: Modified independent (Device/Increase time);Without upper extremity assist;From chair/3-in-1    Stand to Sit 6: Modified independent (Device/Increase time);Without upper extremity assist;To chair/3-in-1      Ambulation/Gait   Ambulation/Gait Yes    Ambulation/Gait Assistance 6: Modified independent (Device/Increase time)    Ambulation/Gait Assistance Details around the clinic with session    Assistive device None    Gait Pattern Step-through pattern;Decreased arm swing - right;Decreased arm swing - left;Wide base of support    Ambulation Surface Level;Indoor      Neuro Re-ed    Neuro Re-ed Details  for balance/NMR: gait outdoors while self tossing ball x 1000 feet while also engaging in conversation. 3 times loosing ball with cues not to chase ball for safety, no balance issues noted.                 Balance Exercises - 05/13/21 0903       Balance Exercises: Standing  SLS Eyes open;Foam/compliant surface;Other reps (comment);Limitations    SLS Limitations on balance board in anterior/posterior direction with 2 tall cones on floor in front of it, no UE support: alternating forward foot taps, cross foot taps, forward double foot taps, and cross double foot taps for ~10 reps each side. min guard assist for balance with cues on posutre and weight shifting.    Rockerboard Anterior/posterior;Lateral;EO;EC;30 seconds;Other reps (comment);Limitations;Head turns    Rockerboard Limitations performed both ways on balance board with no UE support: rocking the board with emphasis on tall posture with EO, progressing to EC with min guard to min assist for balance; then holding the board steady for EC 30 sec's x 3 reps  with min guard assist.                 PT Short Term Goals - 04/27/21 1200       PT SHORT TERM GOAL #1   Title STG = LTG               PT Long Term Goals - 04/27/21 1201       PT LONG TERM GOAL #1   Title Pt will be independent with final progression of HEP for improved strength, balance, transfers, and gait.  TARGET 05/22/2021    Time 2    Period Weeks    Status On-going      PT LONG TERM GOAL #2   Title Pt will report at least 50% improvement of community/ongoing fitness activities back to baseline (walking, yoga, etc)    Time 2    Period Weeks    Status New                   Plan - 05/13/21 0902     Clinical Impression Statement Today's skilled session continued to focus on high level balance training with emphasis on single leg stance activities. No issues noted or reported in session. The is agreeable to discharge at next visit.    Personal Factors and Comorbidities Comorbidity 2    Comorbidities PMH: hyperthyroidism, HTN    Examination-Activity Limitations Locomotion Level;Stand    Examination-Participation Restrictions Community Activity;Occupation   Insurance account manager Stable/Uncomplicated    Rehab Potential Good    PT Frequency 2x / week    PT Duration 8 weeks   plus eval   PT Treatment/Interventions ADLs/Self Care Home Management;Gait training;Functional mobility training;Stair training;Therapeutic activities;Therapeutic exercise;Balance training;Neuromuscular re-education;Patient/family education    PT Next Visit Plan Review/upgrade HEP and discharge at next session    Consulted and Agree with Plan of Care Patient             Patient will benefit from skilled therapeutic intervention in order to improve the following deficits and impairments:  Abnormal gait, Difficulty walking, Decreased endurance, Decreased balance, Decreased mobility  Visit Diagnosis: Other abnormalities of gait and mobility  Unsteadiness  on feet     Problem List Patient Active Problem List   Diagnosis Date Noted   H/O meningioma of the brain 04/21/2021   Brain tumor ([place]) 02/20/2021   Essential hypertension, benign 03/25/2014    Willow Ora, PTA, [facility] Outpatient Neuro [facility] [address] [place], [place] [address] [phone] 05/13/21, 10:27 AM   Name: Barbara Kim MRN: [id number] Date of Birth: 06-27-1956

## 2021-05-15 ENCOUNTER — Other Ambulatory Visit: Payer: Self-pay

## 2021-05-15 ENCOUNTER — Ambulatory Visit: Payer: Medicare HMO

## 2021-05-15 ENCOUNTER — Ambulatory Visit: Payer: Medicare HMO | Admitting: Physical Therapy

## 2021-05-15 ENCOUNTER — Encounter: Payer: Self-pay | Admitting: Physical Therapy

## 2021-05-15 DIAGNOSIS — R2681 Unsteadiness on feet: Secondary | ICD-10-CM

## 2021-05-15 DIAGNOSIS — R41840 Attention and concentration deficit: Secondary | ICD-10-CM | POA: Diagnosis not present

## 2021-05-15 DIAGNOSIS — R4701 Aphasia: Secondary | ICD-10-CM

## 2021-05-15 DIAGNOSIS — R2689 Other abnormalities of gait and mobility: Secondary | ICD-10-CM | POA: Diagnosis not present

## 2021-05-15 DIAGNOSIS — R41841 Cognitive communication deficit: Secondary | ICD-10-CM

## 2021-05-15 DIAGNOSIS — R41842 Visuospatial deficit: Secondary | ICD-10-CM | POA: Diagnosis not present

## 2021-05-15 DIAGNOSIS — R41844 Frontal lobe and executive function deficit: Secondary | ICD-10-CM | POA: Diagnosis not present

## 2021-05-15 DIAGNOSIS — M6281 Muscle weakness (generalized): Secondary | ICD-10-CM | POA: Diagnosis not present

## 2021-05-15 NOTE — Therapy (Signed)
[facility] [address] [place], [place], [address] Phone: [phone]   Fax:  [phone]  Physical Therapy Treatment  Patient Details  Name: Barbara Kim MRN: [id number] Date of Birth: [birth year] Referring Provider (PT): Kathyrn Sheriff   Encounter Date: 05/15/2021   PT End of Session - 05/15/21 0803     Visit Number 11    Number of Visits 17    Date for PT Re-Evaluation 05/22/21    Authorization Type Aetna [place] required (Codes [phone] and [phone] not covered)    Progress Note Due on Visit 61    PT Start Time 0801    PT Stop Time 0836   discharge visit, not all time was needed   PT Time Calculation (min) 35 min    Activity Tolerance Patient tolerated treatment well    Behavior During Therapy [facility] for tasks assessed/performed             Past Medical History:  Diagnosis Date   Chicken pox    Hypertension    Hyperthyroidism    25 years ago    Past Surgical History:  Procedure Laterality Date   APPLICATION OF CRANIAL NAVIGATION Left 02/20/2021   Procedure: APPLICATION OF CRANIAL NAVIGATION;  Surgeon: Consuella Lose, MD;  Location: [facility];  Service: Neurosurgery;  Laterality: Left;   CRANIOTOMY Left 02/20/2021   Procedure: Stereotactic LEFT pterional craniotomy for resection of meningioma;  Surgeon: Consuella Lose, MD;  Location: [facility];  Service: Neurosurgery;  Laterality: Left;   TONSILLECTOMY  1983    There were no vitals filed for this visit.   Subjective Assessment - 05/15/21 0802     Subjective No new complaints. No falls or pain to report. 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    Patient is accompained by: Family member   spouse in car   Pertinent History hyperthyroidism, HTN    Currently in Pain? No/denies    Pain Score 0-No pain                    [facility] Adult PT Treatment/Exercise - 05/15/21 0805       Transfers   Transfers Sit to Stand;Stand to Sit    Sit to  Stand 6: Modified independent (Device/Increase time);Without upper extremity assist;From chair/3-in-1    Stand to Sit 6: Modified independent (Device/Increase time);Without upper extremity assist;To chair/3-in-1      Ambulation/Gait   Ambulation/Gait Yes    Ambulation/Gait Assistance 6: Modified independent (Device/Increase time)    Ambulation/Gait Assistance Details no balance issues noted. occasional foot scuffing on paved surface noted.    Ambulation Distance (Feet) 1000 Feet   x1, plus around clinic with session   Assistive device None    Gait Pattern Step-through pattern;Decreased arm swing - right;Decreased arm swing - left;Wide base of support    Ambulation Surface Level;Unlevel;Indoor;Outdoor;Paved      Exercises   Exercises Other Exercises    Other Exercises  reviewed HEP. Adanced program with no issues noted or reported in session. Refer to [facility] for full details.             Issued to HEP this session:  Access Code: [email or web address] URL: [email or web address] Date: 05/15/2021 Prepared by: Willow Ora  Exercises Forward Step Down with Counter Support at Side - 1 x daily - 5 x weekly - 2 sets - 10 reps  Added this session: Tandem Walking with Counter Support - 1 x daily - 5 x weekly - 1 sets - 3  reps Toe Walking with Counter Support - 1 x daily - 5 x weekly - 1 sets - 3 reps Heel Walking with Counter Support - 1 x daily - 5 x weekly - 1 sets - 3 reps Romberg Stance Eyes Closed on Foam Pad - 1 x daily - 5 x weekly - 1 sets - 3 reps - 30 hold Wide Stance with Eyes Closed and Head Nods on Foam Pad - 1 x daily - 5 x weekly - 1 sets - 10 reps      PT Education - 05/15/21 0830     Education Details progress toward goals, updated HEP and plan for discharge today    Person(s) Educated Patient    Methods Explanation;Demonstration;Verbal cues;Handout    Comprehension Verbalized understanding;Returned demonstration              PT Short Term Goals -  04/27/21 1200       PT SHORT TERM GOAL #1   Title STG = LTG               PT Long Term Goals - 05/15/21 0803       PT LONG TERM GOAL #1   Title Pt will be independent with final progression of HEP for improved strength, balance, transfers, and gait.  TARGET 05/22/2021    Baseline 05/15/21: met with advancement of HEP this session.    Time --    Period Weeks    Status Achieved      PT LONG TERM GOAL #2   Title Pt will report at least 50% improvement of community/ongoing fitness activities back to baseline (walking, yoga, etc)    Baseline 05/15/21: met. Pt has started walking several days a week with friends and doing modified yoga at home. She has plans to return to yoga classes soon as well.    Time --    Period --    Status Achieved                   Plan - 05/15/21 0803     Clinical Impression Statement Today's skilled session focused on progress toward LTGs for discharge today. Pt has started back with community fitness with walking with friends and returning to Yoga at home. She has plans to start back with Yoga classes as well. Also focused on review and advancement of pt's HEP with no issues noted or reported in session today. Pt in agreement for discharge.    Personal Factors and Comorbidities Comorbidity 2    Comorbidities PMH: hyperthyroidism, HTN    Examination-Activity Limitations Locomotion Level;Stand    Examination-Participation Restrictions Community Activity;Occupation   Insurance account manager Stable/Uncomplicated    Rehab Potential Good    PT Frequency 2x / week    PT Duration 8 weeks   plus eval   PT Treatment/Interventions ADLs/Self Care Home Management;Gait training;Functional mobility training;Stair training;Therapeutic activities;Therapeutic exercise;Balance training;Neuromuscular re-education;Patient/family education    PT Next Visit Plan discharge today    Consulted and Agree with Plan of Care Patient              Patient will benefit from skilled therapeutic intervention in order to improve the following deficits and impairments:  Abnormal gait, Difficulty walking, Decreased endurance, Decreased balance, Decreased mobility  Visit Diagnosis: Other abnormalities of gait and mobility  Unsteadiness on feet     Problem List Patient Active Problem List   Diagnosis Date Noted   H/O meningioma of the brain 04/21/2021  Brain tumor ([place]) 02/20/2021   Essential hypertension, benign 03/25/2014    Willow Ora, PTA, [facility] Outpatient Neuro [facility] [address] [place], [place] [address] [phone] 05/15/21, 8:41 AM   Name: Barbara Kim MRN: [id number] Date of Birth: 06-Sep-1956

## 2021-05-15 NOTE — Therapy (Signed)
[facility] [address] [place], [place], [address] Phone: [phone]   Fax:  [phone]  Speech Language Pathology Treatment/Discharge Summary  Patient Details  Name: Barbara Kim MRN: [id number] Date of Birth: 1956-07-31 Referring Provider (SLP): Consuella Lose, MD   Encounter Date: 05/15/2021   End of Session - 05/15/21 0943     Visit Number 13    Number of Visits 18    Date for SLP Re-Evaluation 06/01/21    Authorization Type [employer]    SLP Start Time [phone]    SLP Stop Time  1015    SLP Time Calculation (min) 41 min    Activity Tolerance Patient tolerated treatment well             Past Medical History:  Diagnosis Date   Chicken pox    Hypertension    Hyperthyroidism    25 years ago    Past Surgical History:  Procedure Laterality Date   APPLICATION OF CRANIAL NAVIGATION Left 02/20/2021   Procedure: APPLICATION OF CRANIAL NAVIGATION;  Surgeon: Consuella Lose, MD;  Location: [place] [facility];  Service: Neurosurgery;  Laterality: Left;   CRANIOTOMY Left 02/20/2021   Procedure: Stereotactic LEFT pterional craniotomy for resection of meningioma;  Surgeon: Consuella Lose, MD;  Location: [place];  Service: Neurosurgery;  Laterality: Left;   TONSILLECTOMY  1983    There were no vitals filed for this visit.   Subjective Assessment - 05/15/21 0936     Subjective "I'm doing great"    Currently in Pain? No/denies             SPEECH THERAPY DISCHARGE SUMMARY  Visits from Start of Care: 13  Current functional level related to goals / functional outcomes: "Bett" presents with improvements in expressive and receptive language skills and pt has met all ST goals. Rare anomia/dysnomia exhibited in mod complex conversations, with pt able effectively compensate as needed. Pt exhibited improved comprehension of written and auditory information, although pt occasionally benefits from repetition and  rephrasing. Pt is agreeable to ST discharge and declined further questions.    Remaining deficits: Mild inattention and reduced recall   Education / Equipment: Anomia compensations, word finding tasks, functional cognitive communication tasks   Patient agrees to discharge. Patient goals were met. Patient is being discharged due to meeting the stated rehab goals..         ADULT SLP TREATMENT - 05/15/21 0937       General Information   Behavior/Cognition Alert;Cooperative;Pleasant mood      Treatment Provided   Treatment provided Cognitive-Linquistic      Cognitive-Linquistic Treatment   Treatment focused on Aphasia;Cognition;Patient/family/caregiver education    Skilled Treatment Pt completed HEP to present 1-2 minute presentations, in which pt able to verbalize and recall without need for visual aids. No overt anomia exhibited during presentation. Good comprehension of topics exhibited. Communication Participation Item Bank completed this sesssion, with a score of 26 (20 point increase). Pt has met all ST goals and pt is agreeable to ST discharge.      Assessment / Recommendations / Plan   Plan All goals met;Discharge SLP treatment due to (comment)   ST goals met     Progression Toward Goals   Progression toward goals Goals met, education completed, patient discharged from [facility] - 05/15/21 0943     Education Details functional activities to complete at home  Person(s) Educated Patient    Methods Explanation;Demonstration    Comprehension Verbalized understanding;Returned demonstration              SLP Short Term Goals - 04/27/21 1146       SLP SHORT TERM GOAL #1   Title Pt will complete aphasia HEP with occasional min A over 2 sessions    Baseline 03-23-21, 04-15-21    Status Achieved      SLP SHORT TERM GOAL #2   Title Pt will verbally ID and correct word finding in paragraph level reading or picture description tasks with min A  over 2 sessions    Baseline 04-20-21, 04-22-21    Status Achieved      SLP SHORT TERM GOAL #3   Title Pt will use word finding compensations in 10 minute mod complex conversation with occasional min A over 2 sessions    Baseline 04-15-21, 04-27-21    Status Achieved      SLP SHORT TERM GOAL #4   Title Pt will answer (yes/no/WH-) questions regarding paragraph length information with 80% accuracy given occasional cues over 2 sessions    Baseline 04-15-21, 04-22-21    Status Achieved      SLP SHORT TERM GOAL #5   Title Pt will complete communication PROM in first 1-2 sessions    Baseline Communicative Participation Item Bank= 6    Status Achieved              SLP Long Term Goals - 05/15/21 0944       SLP LONG TERM GOAL #1   Title Pt will complete aphasia HEP with rare min A over 2 sessions    Baseline 04-22-21, 05-06-21    Status Achieved      SLP LONG TERM GOAL #2   Title Pt will use word finding compensations in 15+ minute mod complex conversation with rare min A over 2 sessions    Baseline 05-04-21, 05-13-21    Status Achieved      SLP LONG TERM GOAL #3   Title Pt will demonstrate comprehension of multi-paragraph length information, written instructions, and auditory information within conversation with 80% accuracy over 2 sessions    Baseline 04-15-21, 05-15-21    Status Achieved      SLP LONG TERM GOAL #4   Title Pt will report improved communication effectiveness via communication PROM by 2 points at last ST session    Baseline Communication Participation Item Bank=26    Status Achieved              Plan - 05/15/21 0945     Clinical Impression Statement Barbara Kim "Bett" presents with improvements in mild expressive and receptive aphasia secondary to resection of meingioma. Surgery completed 02-20-21. No overt anomia/dysnomia exhibited in conversation and druing presentations this session. Pt has met all ST goals, in which pt is agreeable to ST discharge at this times. Pt  declined further questions.    Treatment/Interventions Functional tasks;Patient/family education;Language facilitation;Compensatory techniques;Internal/external aids;SLP instruction and feedback;Multimodal communcation approach;Compensatory strategies    Potential to Achieve Goals Good    Consulted and Agree with Plan of Care Patient             Patient will benefit from skilled therapeutic intervention in order to improve the following deficits and impairments:   Aphasia  Cognitive communication deficit    Problem List Patient Active Problem List   Diagnosis Date Noted   H/O meningioma of the brain 04/21/2021   Brain tumor ([place])  02/20/2021   Essential hypertension, benign 03/25/2014    Alinda Deem, MA CCC-SLP 05/15/2021, 10:08 AM  [facility] [address] [place], [place], [address] Phone: [phone]   Fax:  [phone]   Name: Rashad Obeid MRN: [id number] Date of Birth: 19-Jul-1956

## 2021-05-15 NOTE — Patient Instructions (Signed)
Access Code: [email or web address] URL: [email or web address] Date: 05/15/2021 Prepared by: Willow Ora  Exercises Forward Step Down with Counter Support at Side - 1 x daily - 5 x weekly - 2 sets - 10 reps  Added this session: Tandem Walking with Counter Support - 1 x daily - 5 x weekly - 1 sets - 3 reps Toe Walking with Counter Support - 1 x daily - 5 x weekly - 1 sets - 3 reps Heel Walking with Counter Support - 1 x daily - 5 x weekly - 1 sets - 3 reps Romberg Stance Eyes Closed on Foam Pad - 1 x daily - 5 x weekly - 1 sets - 3 reps - 30 hold Wide Stance with Eyes Closed and Head Nods on Foam Pad - 1 x daily - 5 x weekly - 1 sets - 10 reps

## 2021-05-18 ENCOUNTER — Encounter: Payer: Self-pay | Admitting: Physical Therapy

## 2021-05-18 NOTE — Therapy (Signed)
[facility] [address] [name], [place], [address] Phone: [phone]   Fax:  [phone]  Patient Details  Name: Barbara Kim MRN: [id number] Date of Birth: 10/02/1955 Referring Provider:  No ref. provider found  Encounter Date: 05/18/2021   PHYSICAL THERAPY DISCHARGE SUMMARY  Visits from Start of Care: 11  Current functional level related to goals / functional outcomes:  PT Long Term Goals - 05/15/21 0803       PT LONG TERM GOAL #1   Title Pt will be independent with final progression of HEP for improved strength, balance, transfers, and gait.  TARGET 05/22/2021    Baseline 05/15/21: met with advancement of HEP this session.    Time --    Period Weeks    Status Achieved      PT LONG TERM GOAL #2   Title Pt will report at least 50% improvement of community/ongoing fitness activities back to baseline (walking, yoga, etc)    Baseline 05/15/21: met. Pt has started walking several days a week with friends and doing modified yoga at home. She has plans to return to yoga classes soon as well.    Time --    Period --    Status Achieved            Pt has met both LTGs.   Remaining deficits: High level balance, strength   Education / Equipment: Educated in HEP and progression towards community fitness   Patient agrees to discharge. Patient goals were met. Patient is being discharged due to meeting the stated rehab goals.   MARRIOTT,AMY W. 05/18/2021, 2:55 PM MARRIOTT,AMY W., PT  [facility] [address] , [place], [address] Phone: [phone]   Fax:  [phone] 

## 2021-07-27 DIAGNOSIS — D329 Benign neoplasm of meninges, unspecified: Secondary | ICD-10-CM | POA: Diagnosis not present

## 2021-07-27 DIAGNOSIS — I10 Essential (primary) hypertension: Secondary | ICD-10-CM | POA: Diagnosis not present

## 2021-07-30 ENCOUNTER — Other Ambulatory Visit: Payer: Self-pay | Admitting: Neurosurgery

## 2021-07-30 DIAGNOSIS — D329 Benign neoplasm of meninges, unspecified: Secondary | ICD-10-CM

## 2021-08-03 ENCOUNTER — Ambulatory Visit
Admission: RE | Admit: 2021-08-03 | Discharge: 2021-08-03 | Disposition: A | Discharge status: Home / Self Care | Payer: Medicare HMO | Source: Ambulatory Visit | Attending: Neurosurgery | Admitting: Neurosurgery

## 2021-08-03 ENCOUNTER — Other Ambulatory Visit: Payer: Self-pay

## 2021-08-03 DIAGNOSIS — D329 Benign neoplasm of meninges, unspecified: Secondary | ICD-10-CM | POA: Diagnosis not present

## 2021-08-03 DIAGNOSIS — Z9889 Other specified postprocedural states: Secondary | ICD-10-CM | POA: Diagnosis not present

## 2021-08-03 IMAGING — MR MR HEAD WO/W CM
13 series · 48 of 48 positions shown · IV contrast (multihance)
Comparison: [DATE]

CLINICAL DATA: Follow-up meningioma, history of craniotomy

EXAM:
MRI HEAD WITHOUT AND WITH CONTRAST
TECHNIQUE: Multiplanar, multiecho pulse sequences of the brain and surrounding
structures were obtained without and with intravenous contrast.
CONTRAST:  10mL MULTIHANCE GADOBENATE DIMEGLUMINE 529 MG/ML IV SOLN

[Series 2: T1 · sagittal · 5.0mm · 0.45mm/px · 1 of 21 slices shown]
[im 1/21]
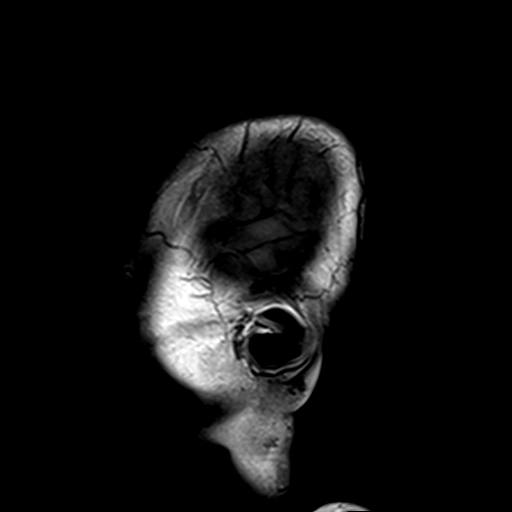

[Series 3: DWI · axial · 3.0mm · 1.80mm/px · z∈[-68,+79]mm · 6 of 99 slices shown (1 of 4)]
[im 1/99]
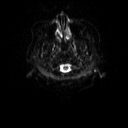
[im 20/99]
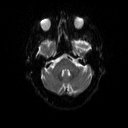
[im 40/99]
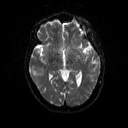
[im 59/99]
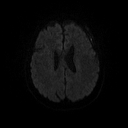
[im 79/99]
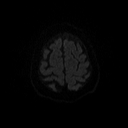
[im 99/99]
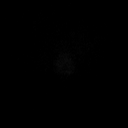

[Series 4: DWI · axial · 3.0mm · 1.80mm/px · z∈[-68,+79]mm · 3 of 44 slices shown (2 of 4)]
[im 1/44]
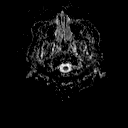
[im 22/44]
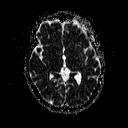
[im 44/44]
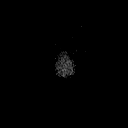

[Series 5: DWI · coronal · 5.0mm · 1.80mm/px · 5 of 68 slices shown (3 of 4)]
[im 1/68]
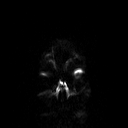
[im 17/68]
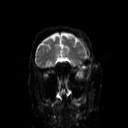
[im 34/68]
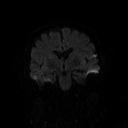
[im 51/68]
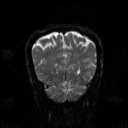
[im 68/68]
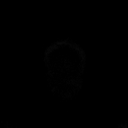

[Series 6: DWI · coronal · 5.0mm · 1.80mm/px · 2 of 34 slices shown (4 of 4)]
[im 1/34]
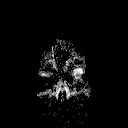
[im 34/34]
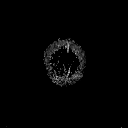

[Series 7: T2 · axial · 5.0mm · 0.60mm/px · 1 of 22 slices shown (1 of 2)]
[im 1/22]
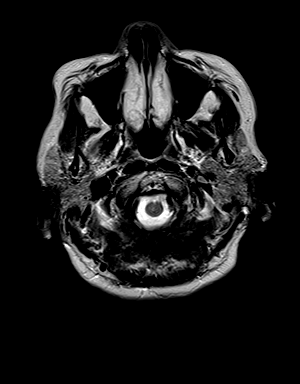

[Series 8: FLAIR · axial · 3.0mm · 0.45mm/px · z∈[-62,+73]mm · 2 of 30 slices shown]
[im 1/30]
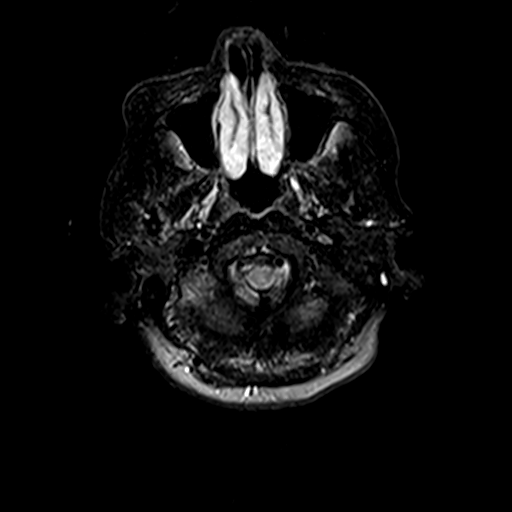
[im 30/30]
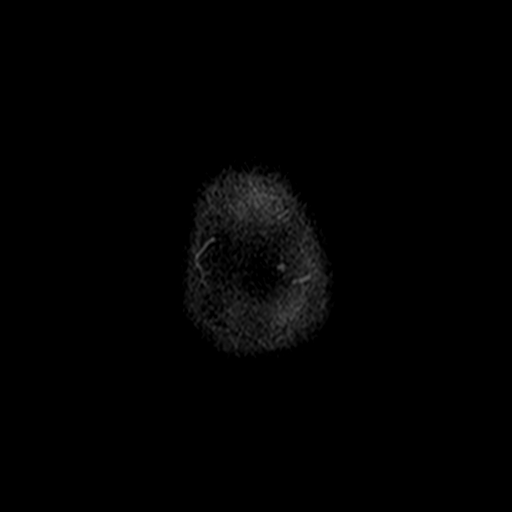

[Series 10: swi_images · axial · 4.0mm · 0.90mm/px · z∈[-72,+84]mm · 3 of 40 slices shown]
[im 1/40]
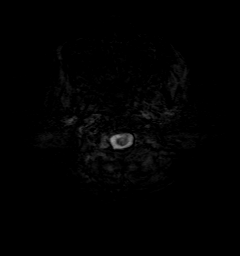
[im 20/40]
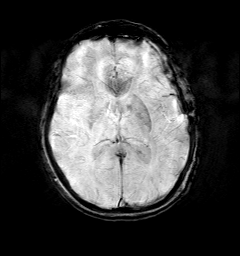
[im 40/40]
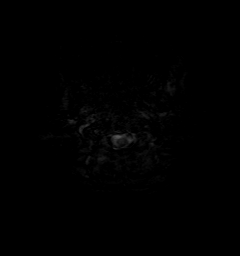

[Series 11: t1_mpr_tra · axial · 1.0mm · 0.75mm/px · z∈[-65,+78]mm · 10 of 144 slices shown]
[im 1/144]
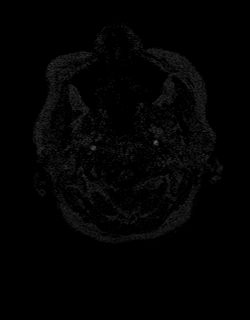
[im 16/144]
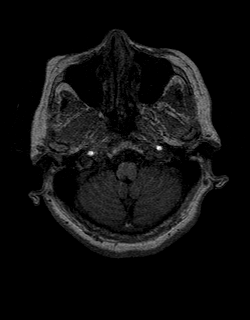
[im 32/144]
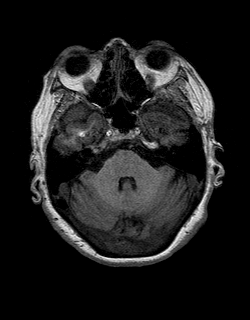
[im 48/144]
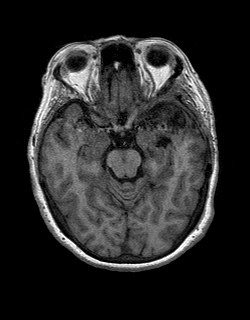
[im 64/144]
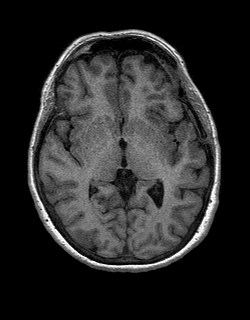
[im 80/144]
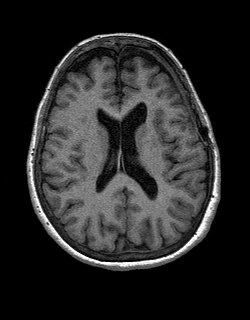
[im 96/144]
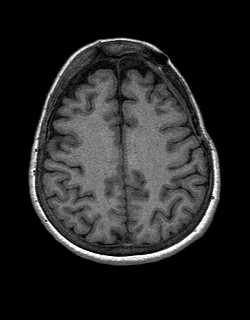
[im 112/144]
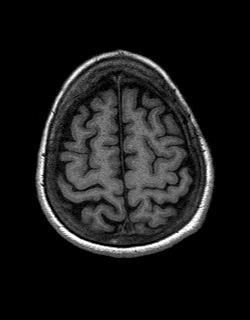
[im 128/144]
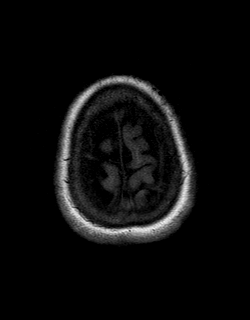
[im 144/144]
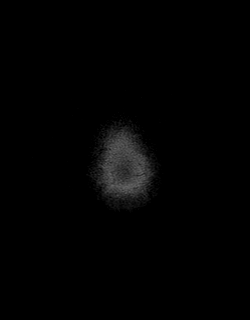

[Series 12: T2 · coronal · 5.0mm · 0.45mm/px · 2 of 25 slices shown (2 of 2)]
[im 1/25]
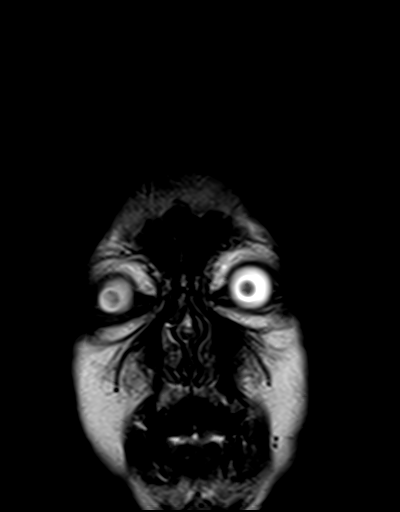
[im 25/25]
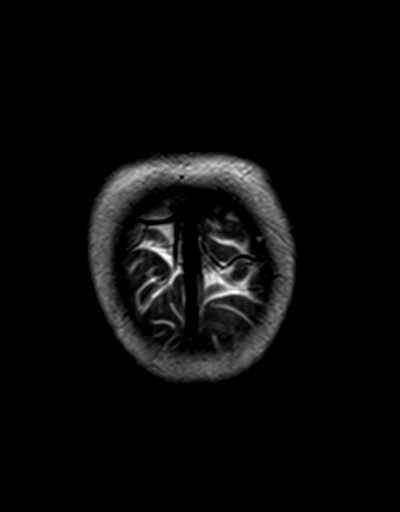

[Series 13: t1_mpr_tra post · axial · 1.0mm · 0.75mm/px · z∈[-65,+78]mm · 10 of 144 slices shown]
[im 1/144]
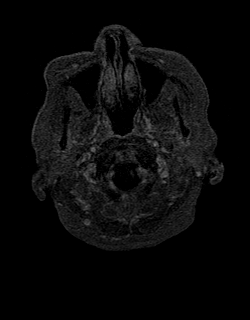
[im 16/144]
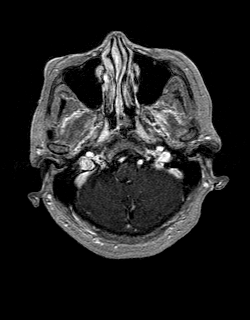
[im 32/144]
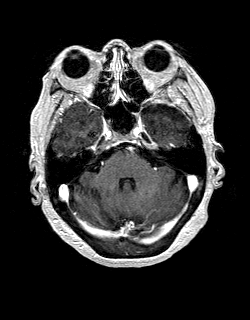
[im 48/144]
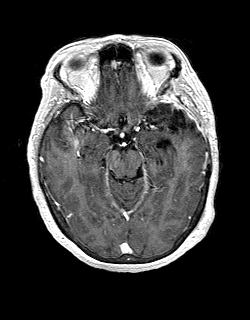
[im 64/144]
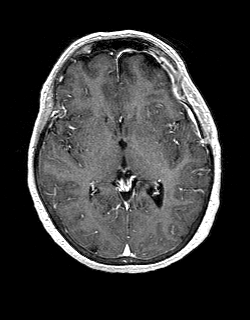
[im 80/144]
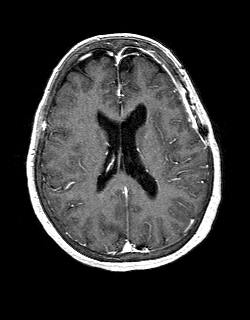
[im 96/144]
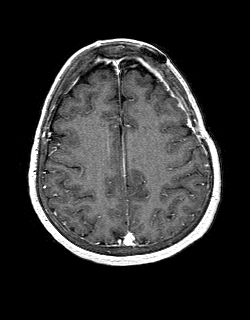
[im 112/144]
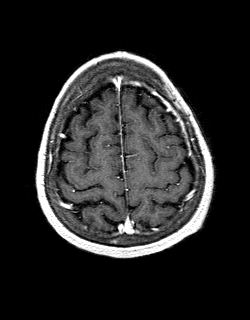
[im 128/144]
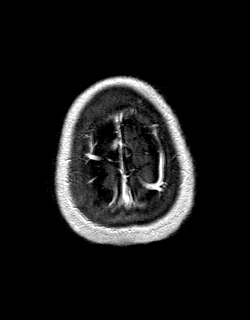
[im 144/144]
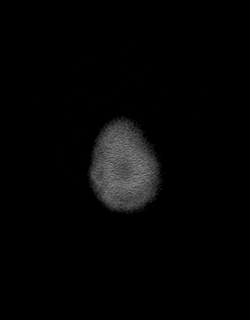

[Series 14: post cor · coronal · 5.0mm · 0.45mm/px · 2 of 25 slices shown]
[im 1/25]
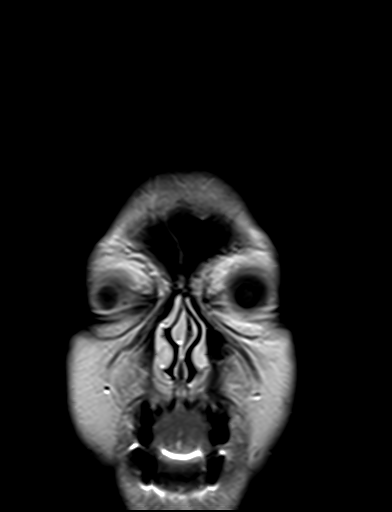
[im 25/25]
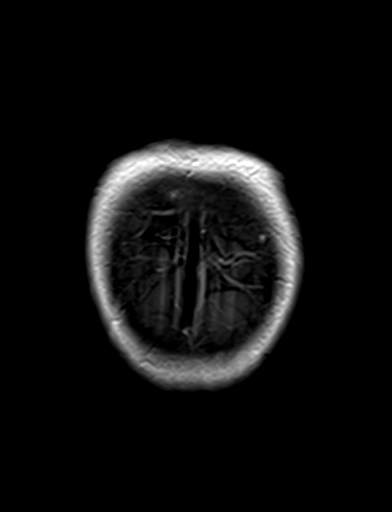

[Series 15: T1 post-contrast · sagittal · 5.0mm · 0.45mm/px · 1 of 21 slices shown]
[im 1/21]
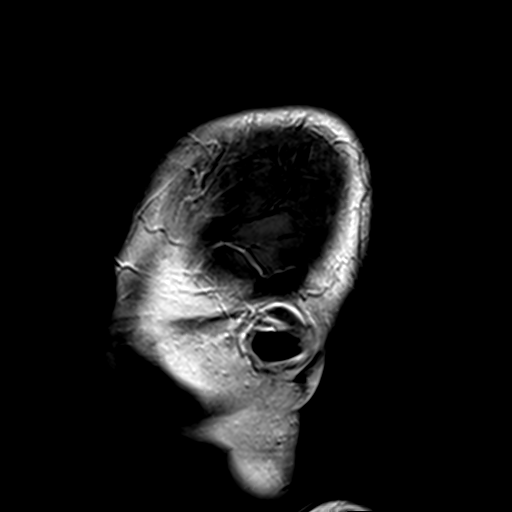

[48 of 48 positions shown; findings below may reference images not displayed]

FINDINGS: Brain: Status post left frontal craniotomy with subjacent resection
cavity in the left temporal lobe from prior left sphenoid wing
meningioma resection. Interval decrease in the size of the resection
cavity, with some T1 hyperintense material within it, likely
postsurgical blood products. No abnormal enhancement within the
resection cavity. There is mild dural thickening and enhancement,
without nodularity to suggest residual or recurrent meningioma.
Improvement in previously noted edema in the anterior left temporal
lobe, with increased T2 and decreased T1 signal corresponding to an
area of previously noted restricted diffusion at the lateral aspect
of the resection cavity, consistent with encephalomalacia.

Interval resolution of previously noted pneumocephalus and
associated mass effect, with decreased in the size of the left
frontal convexity extra-axial collection, now measuring 4 mm and
consistent with chronic blood products.

No acute infarction or parenchymal hemorrhage. Resolution of
previously noted mass effect and midline shift. No hydrocephalus.

Vascular: Normal flow voids.

Skull and upper cervical spine: Status post left frontal craniotomy.

Sinuses/Orbits: Negative.

Other: The mastoids are well aerated.
IMPRESSION: 1. Status post left frontal craniotomy and resection of a left
sphenoid wing meningioma, with no definite residual or recurrent
meningioma.
2. Improvement in previously noted postsurgical changes including
resolution of pneumocephalus, mass effect, and midline shift, and
decreased left extra-axial fluid collection.

## 2021-08-03 MED ORDER — GADOBENATE DIMEGLUMINE 529 MG/ML IV SOLN
10.0000 mL | Freq: Once | INTRAVENOUS | Status: AC | PRN
  Administered 2021-08-03: 10 mL via INTRAVENOUS

## 2021-08-05 DIAGNOSIS — I10 Essential (primary) hypertension: Secondary | ICD-10-CM | POA: Diagnosis not present

## 2021-08-05 DIAGNOSIS — D329 Benign neoplasm of meninges, unspecified: Secondary | ICD-10-CM | POA: Diagnosis not present

## 2021-09-17 DIAGNOSIS — Z6826 Body mass index (BMI) 26.0-26.9, adult: Secondary | ICD-10-CM | POA: Diagnosis not present

## 2021-09-17 DIAGNOSIS — M816 Localized osteoporosis [Lequesne]: Secondary | ICD-10-CM | POA: Diagnosis not present

## 2021-09-17 DIAGNOSIS — R29890 Loss of height: Secondary | ICD-10-CM | POA: Diagnosis not present

## 2021-09-17 DIAGNOSIS — Z1231 Encounter for screening mammogram for malignant neoplasm of breast: Secondary | ICD-10-CM | POA: Diagnosis not present

## 2021-09-17 DIAGNOSIS — N958 Other specified menopausal and perimenopausal disorders: Secondary | ICD-10-CM | POA: Diagnosis not present

## 2021-09-17 DIAGNOSIS — Z01419 Encounter for gynecological examination (general) (routine) without abnormal findings: Secondary | ICD-10-CM | POA: Diagnosis not present

## 2021-11-25 ENCOUNTER — Telehealth: Payer: Self-pay | Admitting: Family Medicine

## 2021-11-25 NOTE — Telephone Encounter (Signed)
Left message for patient to call back and schedule Medicare Annual Wellness Visit (AWV) either virtually or in office. Left  my Barbara Kim number [phone] ? ? ?awvi 09/27/21 per [facility] ?please schedule at anytime with [facility] Nurse Health Advisor 1 or 2 ? ? ?This should be a 45 minute visit.  ?

## 2021-12-21 ENCOUNTER — Encounter: Payer: Self-pay | Admitting: Family Medicine

## 2021-12-21 ENCOUNTER — Ambulatory Visit (INDEPENDENT_AMBULATORY_CARE_PROVIDER_SITE_OTHER): Payer: No Typology Code available for payment source | Admitting: Family Medicine

## 2021-12-21 VITALS — BP 170/70 | HR 59 | Temp 97.4°F | Ht <= 58 in | Wt 117.2 lb

## 2021-12-21 DIAGNOSIS — I10 Essential (primary) hypertension: Secondary | ICD-10-CM | POA: Diagnosis not present

## 2021-12-21 DIAGNOSIS — L309 Dermatitis, unspecified: Secondary | ICD-10-CM

## 2021-12-21 MED ORDER — TRIAMCINOLONE ACETONIDE 0.1 % EX CREA: 1.0000 "application " | TOPICAL_CREAM | Freq: Two times a day (BID) | CUTANEOUS | 2 refills | Status: DC | PRN

## 2021-12-21 NOTE — Progress Notes (Signed)
? ?Established Patient Office Visit ? ?Subjective:  ?Patient ID: Barbara Kim, female    DOB: 28-Jun-1956  Age: 66 y.o. MRN: [id number] ? ?CC:  ?Chief Complaint  ?Patient presents with  ? Allergic Reaction  ? ? ?HPI ?Barbara Kim presents for eczema type rash right forearm mostly dorsal surface.  She apparently had similar rash in the past previously evaluated by dermatologist and treated with some type of topical.  First noticed flareup about a month ago.  She had some hydrocortisone 2.5% cream which helped slightly but insufficiently.  She has been scratching this frequently.  Minimal left-sided involvement.  Denies any rash on her lower extremities.  No facial involvement. ? ?History of hypertension.  She takes amlodipine 5 mg daily but apparently did not take her medication past 2 days.  No headaches.  Benign meningioma excision about a year ago.  She has done extremely well since then. ? ?Past Medical History:  ?Diagnosis Date  ? Chicken pox   ? Hypertension   ? Hyperthyroidism   ? 25 years ago  ? ? ?Past Surgical History:  ?Procedure Laterality Date  ? APPLICATION OF CRANIAL NAVIGATION Left 02/20/2021  ? Procedure: APPLICATION OF CRANIAL NAVIGATION;  Surgeon: Consuella Lose, MD;  Location: [facility];  Service: Neurosurgery;  Laterality: Left;  ? CRANIOTOMY Left 02/20/2021  ? Procedure: Stereotactic LEFT pterional craniotomy for resection of meningioma;  Surgeon: Consuella Lose, MD;  Location: [facility];  Service: Neurosurgery;  Laterality: Left;  ? TONSILLECTOMY  1983  ? ? ?Family History  ?Adopted: Yes  ? ? ?Social History  ? ?Socioeconomic History  ? Marital status: Unknown  ?  Spouse name: Not on file  ? Number of children: Not on file  ? Years of education: Not on file  ? Highest education level: Not on file  ?Occupational History  ? Not on file  ?Tobacco Use  ? Smoking status: Never  ? Smokeless tobacco: Never  ?Vaping Use  ? Vaping Use: Never used  ?Substance and Sexual Activity  ? Alcohol use: No  ?  Drug use: No  ? Sexual activity: Not on file  ?Other Topics Concern  ? Not on file  ?Social History Narrative  ? Not on file  ? ?Social Determinants of Health  ? ?Financial Resource Strain: Not on file  ?Food Insecurity: Not on file  ?Transportation Needs: Not on file  ?Physical Activity: Not on file  ?Stress: Not on file  ?Social Connections: Not on file  ?Intimate Partner Violence: Not on file  ? ? ?Outpatient Medications Prior to Visit  ?Medication Sig Dispense Refill  ? amLODipine (NORVASC) 5 MG tablet Take 1 tablet (5 mg total) by mouth daily. 90 tablet 3  ? HYDROcodone-acetaminophen (NORCO/VICODIN) 5-325 MG tablet Take 1 tablet by mouth every 4 (four) hours as needed for moderate pain. 30 tablet 0  ? levETIRAcetam (KEPPRA) 500 MG tablet Take 500 mg by mouth 2 (two) times daily.    ? Vitamin D, Ergocalciferol, (DRISDOL) 50000 units CAPS capsule TAKE 1 CAPSULE (50,000 UNITS TOTAL) BY MOUTH EVERY 7 (SEVEN) DAYS. 4 capsule 0  ? ?No facility-administered medications prior to visit.  ? ? ?No Known Allergies ? ?ROS ?Review of Systems  ?Constitutional:  Negative for chills and fever.  ?Respiratory:  Negative for shortness of breath.   ?Cardiovascular:  Negative for chest pain.  ?Skin:  Positive for rash.  ?Neurological:  Negative for dizziness and headaches.  ? ?  ?Objective:  ?  ?Physical Exam ?Vitals reviewed.  ?  Constitutional:   ?   Appearance: Normal appearance.  ?Cardiovascular:  ?   Rate and Rhythm: Normal rate and regular rhythm.  ?Pulmonary:  ?   Effort: Pulmonary effort is normal.  ?   Breath sounds: Normal breath sounds.  ?Skin: ?   Findings: Rash present.  ?   Comments: She has somewhat dry slightly scaly irregular rash dorsum right forearm.  No pustules.  No vesicles.  No antecubital involvement  ?Neurological:  ?   Mental Status: She is alert.  ? ? ?BP (!) 170/70 (BP Location: Left Arm, Patient Position: Sitting, Cuff Size: Normal)   Pulse (!) 59   Temp (!) 97.4 ?F (36.3 ?C) (Oral)   Ht '4\' 8"'$  (1.422 m)    Wt 117 lb 3.2 oz (53.2 kg)   SpO2 96%   BMI 26.28 kg/m?  ?Wt Readings from Last 3 Encounters:  ?12/21/21 117 lb 3.2 oz (53.2 kg)  ?04/21/21 109 lb 3.2 oz (49.5 kg)  ?02/20/21 98 lb 15.8 oz (44.9 kg)  ? ? ? ?Health Maintenance Due  ?Topic Date Due  ? COLONOSCOPY (Pts 45-73yr Insurance coverage will need to be confirmed)  Never done  ? Zoster Vaccines- Shingrix (1 of 2) Never done  ? COVID-19 Vaccine (3 - Booster for Moderna series) 02/19/2020  ? DEXA SCAN  Never done  ? INFLUENZA VACCINE  04/27/2021  ? ? ?There are no preventive care reminders to display for this patient. ? ?Lab Results  ?Component Value Date  ? TSH 0.89 04/21/2021  ? ?Lab Results  ?Component Value Date  ? WBC 5.9 04/21/2021  ? HGB 13.4 04/21/2021  ? HCT 40.2 04/21/2021  ? MCV 92.3 04/21/2021  ? PLT 207.0 04/21/2021  ? ?Lab Results  ?Component Value Date  ? NA 139 04/21/2021  ? K 3.9 04/21/2021  ? CO2 27 04/21/2021  ? GLUCOSE 99 04/21/2021  ? BUN 22 04/21/2021  ? CREATININE 0.63 04/21/2021  ? BILITOT 0.3 04/21/2021  ? ALKPHOS 87 04/21/2021  ? AST 13 04/21/2021  ? ALT 10 04/21/2021  ? PROT 7.2 04/21/2021  ? ALBUMIN 4.0 04/21/2021  ? CALCIUM 9.1 04/21/2021  ? ANIONGAP 8 02/23/2021  ? GFR 93.05 04/21/2021  ? ?Lab Results  ?Component Value Date  ? CHOL 226 (H) 04/21/2021  ? ?Lab Results  ?Component Value Date  ? HDL 53.00 04/21/2021  ? ?Lab Results  ?Component Value Date  ? [place]142 (H) 04/21/2021  ? ?Lab Results  ?Component Value Date  ? TRIG 159.0 (H) 04/21/2021  ? ?Lab Results  ?Component Value Date  ? CHOLHDL 4 04/21/2021  ? ?Lab Results  ?Component Value Date  ? HGBA1C 5.6 04/21/2021  ? ? ?  ?Assessment & Plan:  ? ?#1 eczematous rash right forearm.  She had similar rash in the past.  Recommend triamcinolone 0.1% cream with prescription sent.  Use twice daily as needed.  Touch base if not improving next few weeks.  Avoid scratching as much as possible ? ?#2 hypertension.  Poorly controlled today reflecting lack of medication past couple of  days.  Get back on amlodipine 5 mg daily and monitor and be in touch if not consistently 130/80 or less ? ? ?Meds ordered this encounter  ?Medications  ? triamcinolone cream (KENALOG) 0.1 %  ?  Sig: Apply 1 application. topically 2 (two) times daily as needed.  ?  Dispense:  30 g  ?  Refill:  2  ? ? ?Follow-up: No follow-ups on file.  ? ? ?  Carolann Littler, MD ?

## 2021-12-21 NOTE — Patient Instructions (Signed)
Get back on regular use of Amlodipine.  ?

## 2021-12-28 ENCOUNTER — Ambulatory Visit: Payer: No Typology Code available for payment source

## 2022-01-11 ENCOUNTER — Ambulatory Visit (INDEPENDENT_AMBULATORY_CARE_PROVIDER_SITE_OTHER): Payer: No Typology Code available for payment source

## 2022-01-11 VITALS — BP 120/64 | HR 56 | Temp 97.8°F | Ht <= 58 in | Wt 117.9 lb

## 2022-01-11 DIAGNOSIS — Z0000 Encounter for general adult medical examination without abnormal findings: Secondary | ICD-10-CM | POA: Diagnosis not present

## 2022-01-11 NOTE — Patient Instructions (Addendum)
?Ms. Houdek , ?Thank you for taking time to come for your Medicare Wellness Visit. I appreciate your ongoing commitment to your health goals. Please review the following plan we discussed and let me know if I can assist you in the future.  ? ?These are the goals we discussed: ? Goals   ? ?   Increase physical activity (pt-stated)   ?   Continue to walk and run everyday. ?  ? ?  ?  ?This is a list of the screening recommended for you and due dates:  ?Health Maintenance  ?Topic Date Due  ? DEXA scan (bone density measurement)  Never done  ? COVID-19 Vaccine (3 - Booster for Moderna series) 01/27/2022*  ? Zoster (Shingles) Vaccine (1 of 2) 04/12/2022*  ? Colon Cancer Screening  01/12/2023*  ? Flu Shot  04/27/2022  ? Mammogram  08/08/2022  ? Tetanus Vaccine  03/25/2024  ? Pneumonia Vaccine  Completed  ? Hepatitis C Screening: USPSTF Recommendation to screen - Ages 13-79 yo.  Completed  ? HPV Vaccine  Aged Out  ?*Topic was postponed. The date shown is not the original due date.  ? ?Opioid Pain Medicine Management ?Opioids are powerful medicines that are used to treat moderate to severe pain. When used for short periods of time, they can help you to: ?Sleep better. ?Do better in physical or occupational therapy. ?Feel better in the first few days after an injury. ?Recover from surgery. ?Opioids should be taken with the supervision of a trained health care provider. They should be taken for the shortest period of time possible. This is because opioids can be addictive, and the longer you take opioids, the greater your risk of addiction. This addiction can also be called opioid use disorder. ?What are the risks? ?Using opioid pain medicines for longer than 3 days increases your risk of side effects. Side effects include: ?Constipation. ?Nausea and vomiting. ?Breathing difficulties (respiratory depression). ?Drowsiness. ?Confusion. ?Opioid use disorder. ?Itching. ?Taking opioid pain medicine for a long period of time can  affect your ability to do daily tasks. It also puts you at risk for: ?Motor vehicle crashes. ?Depression. ?Suicide. ?Heart attack. ?Overdose, which can be life-threatening. ?What is a pain treatment plan? ?A pain treatment plan is an agreement between you and your health care provider. Pain is unique to each person, and treatments vary depending on your condition. To manage your pain, you and your health care provider need to work together. To help you do this: ?Discuss the goals of your treatment, including how much pain you might expect to have and how you will manage the pain. ?Review the risks and benefits of taking opioid medicines. ?Remember that a good treatment plan uses more than one approach and minimizes the chance of side effects. ?Be honest about the amount of medicines you take and about any drug or alcohol use. ?Get pain medicine prescriptions from only one health care provider. ?Pain can be managed with many types of alternative treatments. Ask your health care provider to refer you to one or more specialists who can help you manage pain through: ?Physical or occupational therapy. ?Counseling (cognitive behavioral therapy). ?Good nutrition. ?Biofeedback. ?Massage. ?Meditation. ?Non-opioid medicine. ?Following a gentle exercise program. ?How to use opioid pain medicine ?Taking medicine ?Take your pain medicine exactly as told by your health care provider. Take it only when you need it. ?If your pain gets less severe, you may take less than your prescribed dose if your health care provider approves. ?If  you are not having pain, do nottake pain medicine unless your health care provider tells you to take it. ?If your pain is severe, do nottry to treat it yourself by taking more pills than instructed on your prescription. Contact your health care provider for help. ?Write down the times when you take your pain medicine. It is easy to become confused while on pain medicine. Writing the time can help you  avoid overdose. ?Take other over-the-counter or prescription medicines only as told by your health care provider. ?Keeping yourself and others safe ? ?While you are taking opioid pain medicine: ?Do not drive, use machinery, or power tools. ?Do not sign legal documents. ?Do not drink alcohol. ?Do not take sleeping pills. ?Do not supervise children by yourself. ?Do not do activities that require climbing or being in high places. ?Do not go to a lake, river, ocean, spa, or swimming pool. ?Do not share your pain medicine with anyone. ?Keep pain medicine in a locked cabinet or in a secure area where pets and children cannot reach it. ?Stopping your use of opioids ?If you have been taking opioid medicine for more than a few weeks, you may need to slowly decrease (taper) how much you take until you stop completely. Tapering your use of opioids can decrease your risk of symptoms of withdrawal, such as: ?Pain and cramping in the abdomen. ?Nausea. ?Sweating. ?Sleepiness. ?Restlessness. ?Uncontrollable shaking (tremors). ?Cravings for the medicine. ?Do not attempt to taper your use of opioids on your own. Talk with your health care provider about how to do this. Your health care provider may prescribe a step-down schedule based on how much medicine you are taking and how long you have been taking it. ?Getting rid of leftover pills ?Do not save any leftover pills. Get rid of leftover pills safely by: ?Taking the medicine to a prescription take-back program. This is usually offered by the county or law enforcement. ?Bringing them to a pharmacy that has a drug disposal container. ?Flushing them down the toilet. Check the label or package insert of your medicine to see whether this is safe to do. ?Throwing them out in the trash. Check the label or package insert of your medicine to see whether this is safe to do. ?If it is safe to throw it out, remove the medicine from the original container, put it into a sealable bag or  container, and mix it with used coffee grounds, food scraps, dirt, or cat litter before putting it in the trash. ?Follow these instructions at home: ?Activity ?Do exercises as told by your health care provider. ?Avoid activities that make your pain worse. ?Return to your normal activities as told by your health care provider. Ask your health care provider what activities are safe for you. ?General instructions ?You may need to take these actions to prevent or treat constipation: ?Drink enough fluid to keep your urine pale yellow. ?Take over-the-counter or prescription medicines. ?Eat foods that are high in fiber, such as beans, whole grains, and fresh fruits and vegetables. ?Limit foods that are high in fat and processed sugars, such as fried or sweet foods. ?Keep all follow-up visits. This is important. ?Where to find support ?If you have been taking opioids for a long time, you may benefit from receiving support for quitting from a local support group or counselor. Ask your health care provider for a referral to these resources in your area. ?Where to find more information ?Centers for Disease Control and Prevention (CDC): http://www.wolf.info/ ?[place] Food  and Drug Administration (FDA): [place].ch ?Get help right away if: ?You may have taken too much of an opioid (overdosed). Common symptoms of an overdose: ?Your breathing is slower or more shallow than normal. ?You have a very slow heartbeat (pulse). ?You have slurred speech. ?You have nausea and vomiting. ?Your pupils become very small. ?You have other potential symptoms: ?You are very confused. ?You faint or feel like you will faint. ?You have cold, clammy skin. ?You have blue lips or fingernails. ?You have thoughts of harming yourself or harming others. ?These symptoms may represent a serious problem that is an emergency. Do not wait to see if the symptoms will go away. Get medical help right away. Call your local emergency services (911 in the [place] Do not drive  yourself to the hospital.  ?If you ever feel like you may hurt yourself or others, or have thoughts about taking your own life, get help right away. Go to your nearest emergency department or: ?Call your local emergen

## 2022-01-11 NOTE — Progress Notes (Signed)
? ?Subjective:  ? Barbara Kim is a 66 y.o. female who presents for Medicare Annual (Subsequent) preventive examination. ? ?Review of Systems    ?Cardiac Risk Factors include: advanced age (>80mn, >>34women);hypertension ? ?   ?Objective:  ?  ?Today's Vitals  ? 01/11/22 1007  ?BP: 120/64  ?Pulse: (!) 56  ?Temp: 97.8 ?F (36.6 ?C)  ?TempSrc: Oral  ?Weight: 117 lb 14.4 oz (53.5 kg)  ?Height: '4\' 8"'$  (1.422 m)  ? ?Body mass index is 26.43 kg/m?. ? ? ?  01/11/2022  ? 10:16 AM 03/12/2021  ?  2:03 PM 03/11/2021  ?  5:13 PM 02/17/2021  ?  8:20 AM  ?Advanced Directives  ?Does Patient Have a Medical Advance Directive? No No No No  ?Would patient like information on creating a medical advance directive? No - Patient declined No - Patient declined No - Patient declined   ? ? ?Current Medications (verified) ?Outpatient Encounter Medications as of 01/11/2022  ?Medication Sig  ? amLODipine (NORVASC) 5 MG tablet Take 1 tablet (5 mg total) by mouth daily.  ? HYDROcodone-acetaminophen (NORCO/VICODIN) 5-325 MG tablet Take 1 tablet by mouth every 4 (four) hours as needed for moderate pain.  ? levETIRAcetam (KEPPRA) 500 MG tablet Take 500 mg by mouth 2 (two) times daily.  ? triamcinolone cream (KENALOG) 0.1 % Apply 1 application. topically 2 (two) times daily as needed.  ? Vitamin D, Ergocalciferol, (DRISDOL) 50000 units CAPS capsule TAKE 1 CAPSULE (50,000 UNITS TOTAL) BY MOUTH EVERY 7 (SEVEN) DAYS.  ? ?No facility-administered encounter medications on file as of 01/11/2022.  ? ? ?Allergies (verified) ?Patient has no known allergies.  ? ?History: ?Past Medical History:  ?Diagnosis Date  ? Chicken pox   ? Hypertension   ? Hyperthyroidism   ? 25 years ago  ? ?Past Surgical History:  ?Procedure Laterality Date  ? APPLICATION OF CRANIAL NAVIGATION Left 02/20/2021  ? Procedure: APPLICATION OF CRANIAL NAVIGATION;  Surgeon: NConsuella Lose MD;  [facility]  Service: Neurosurgery;  Laterality: Left;  ? CRANIOTOMY Left 02/20/2021  ?  Procedure: Stereotactic LEFT pterional craniotomy for resection of meningioma;  Surgeon: NConsuella Lose MD;  [facility]  Service: Neurosurgery;  Laterality: Left;  ? TONSILLECTOMY  1983  ? ?Family History  ?Adopted: Yes  ? ?Social History  ? ?Socioeconomic History  ? Marital status: Unknown  ?  Spouse name: Not on file  ? Number of children: Not on file  ? Years of education: Not on file  ? Highest education level: Not on file  ?Occupational History  ? Not on file  ?Tobacco Use  ? Smoking status: Never  ? Smokeless tobacco: Never  ?Vaping Use  ? Vaping Use: Never used  ?Substance and Sexual Activity  ? Alcohol use: No  ? Drug use: No  ? Sexual activity: Not on file  ?Other Topics Concern  ? Not on file  ?Social History Narrative  ? Not on file  ? ?Social Determinants of Health  ? ?Financial Resource Strain: Low Risk   ? Difficulty of Paying Living Expenses: Not hard at all  ?Food Insecurity: No Food Insecurity  ? Worried About RCharity fundraiserin the Last Year: Never true  ? Ran Out of Food in the Last Year: Never true  ?Transportation Needs: No Transportation Needs  ? Lack of Transportation (Medical): No  ? Lack of Transportation (Non-Medical): No  ?Physical Activity: Sufficiently Active  ? Days of Exercise per Week: 6 days  ? Minutes  of Exercise per Session: 60 min  ?Stress: No Stress Concern Present  ? Feeling of Stress : Not at all  ?Social Connections: Socially Integrated  ? Frequency of Communication with Friends and Family: More than three times a week  ? Frequency of Social Gatherings with Friends and Family: More than three times a week  ? Attends Religious Services: More than 4 times per year  ? Active Member of Clubs or Organizations: Yes  ? Attends Archivist Meetings: More than 4 times per year  ? Marital Status: Married  ? ? ?Tobacco Counseling ?Counseling given: Not Answered ? ? ?Clinical Intake: ? ?Pre-visit preparation completed: No ? ?Diabetic?  No ? ?  ?Activities of Daily  Living ? ?  01/11/2022  ? 10:14 AM 02/17/2021  ?  8:22 AM  ?In your present state of health, do you have any difficulty performing the following activities:  ?Hearing? 0   ?Vision? 0   ?Difficulty concentrating or making decisions? 0   ?Walking or climbing stairs? 0   ?Dressing or bathing? 0   ?Doing errands, shopping? 0 0  ?Preparing Food and eating ? N   ?Using the Toilet? N   ?In the past six months, have you accidently leaked urine? N   ?Do you have problems with loss of bowel control? N   ?Managing your Medications? N   ?Managing your Finances? N   ?Housekeeping or managing your Housekeeping? N   ? ? ?Patient Care Team: ?Eulas Post, MD as PCP - General (Family Medicine) ?Molli Posey, MD as Consulting Physician (Obstetrics and Gynecology) ? ?Indicate any recent Medical Services you may have received from other than [facility] providers in the past year (date may be approximate). ? ?   ?Assessment:  ? This is a routine wellness examination for [name]. ? ?Hearing/Vision screen ?Hearing Screening - Comments:: No Hearing Difficulty ?Vision Screening - Comments:: Wears reading glasses ? ?Dietary issues and exercise activities discussed: ?Exercise limited by: None identified ? ? Goals Addressed   ? ?  ?  ?  ?  ?  ? This Visit's Progress  ?   Increase physical activity (pt-stated)     ?   Continue to walk and run everyday. ?  ? ?  ? ?Depression Screen ? ?  01/11/2022  ? 10:12 AM 12/21/2021  ?  9:51 AM 06/22/2019  ?  7:47 AM  ?PHQ 2/9 Scores  ?PHQ - 2 Score 0 0 0  ?PHQ- 9 Score   0  ?  ?Fall Risk ? ?  01/11/2022  ? 10:15 AM 12/21/2021  ?  9:52 AM  ?Fall Risk   ?Falls in the past year? 0 0  ?Number falls in past yr: 0 0  ?Injury with Fall? 0 0  ?Risk for fall due to : No Fall Risks No Fall Risks  ?Follow up  Falls evaluation completed  ? ? ?FALL RISK PREVENTION PERTAINING TO THE HOME: ? ?Any stairs in or around the home? Yes  ?If so, are there any without handrails? No  ?Home free of loose throw rugs in walkways, pet  beds, electrical cords, etc? Yes  ?Adequate lighting in your home to reduce risk of falls? Yes  ? ?ASSISTIVE DEVICES UTILIZED TO PREVENT FALLS: ? ?Life alert? No  ?Use of a cane, walker or w/c? No  ?Grab bars in the bathroom? No  ?Shower chair or bench in shower? No  ?Elevated toilet seat or a handicapped toilet? No  ? ?TIMED UP  AND GO: ? ?Was the test performed? Yes .  ?Length of time to ambulate 10 feet: 5 sec.  ? ?Gait steady and fast without use of assistive device ? ?Cognitive Function: ?  ?  ? ?  01/11/2022  ? 10:16 AM  ?6CIT Screen  ?What Year? 0 points  ?What month? 0 points  ?What time? 0 points  ?Count back from 20 0 points  ?Months in reverse 0 points  ?Repeat phrase 0 points  ?Total Score 0 points  ? ? ?Immunizations ?Immunization History  ?Administered Date(s) Administered  ? Influenza,inj,Quad PF,6+ Mos 06/22/2019  ? Moderna Sars-Covid-2 Vaccination 11/23/2019, 12/25/2019  ? PNEUMOCOCCAL CONJUGATE-20 04/21/2021  ? Tdap 03/25/2014  ? ? ?TDAP status: Up to date ? ?Flu Vaccine status: Up to date ? ?Pneumococcal vaccine status: Up to date ? ?Covid-19 vaccine status: Information provided on how to obtain vaccines.  ? ?Qualifies for Shingles Vaccine? Yes   ?Zostavax completed No   ?Shingrix Completed?: No.    Education has been provided regarding the importance of this vaccine. Patient has been advised to call insurance company to determine out of pocket expense if they have not yet received this vaccine. Advised may also receive vaccine at local pharmacy or Health Dept. Verbalized acceptance and understanding. ? ?Screening Tests ?Health Maintenance  ?Topic Date Due  ? DEXA SCAN  Never done  ? COVID-19 Vaccine (3 - Booster for Moderna series) 01/27/2022 (Originally 02/19/2020)  ? Zoster Vaccines- Shingrix (1 of 2) 04/12/2022 (Originally 10/28/2005)  ? COLONOSCOPY (Pts 45-14yr Insurance coverage will need to be confirmed)  01/12/2023 (Originally 10/28/2000)  ? INFLUENZA VACCINE  04/27/2022  ? MAMMOGRAM  08/08/2022   ? TETANUS/TDAP  03/25/2024  ? Pneumonia Vaccine 66 Years old  Completed  ? Hepatitis C Screening  Completed  ? HPV VACCINES  Aged Out  ? ? ?Health Maintenance ? ?Health Maintenance Due  ?Topic Date Due  ?

## 2022-02-08 ENCOUNTER — Other Ambulatory Visit: Payer: Self-pay | Admitting: Neurosurgery

## 2022-02-08 DIAGNOSIS — D329 Benign neoplasm of meninges, unspecified: Secondary | ICD-10-CM

## 2022-02-21 ENCOUNTER — Ambulatory Visit
Admission: RE | Admit: 2022-02-21 | Discharge: 2022-02-21 | Disposition: A | Discharge status: Home / Self Care | Payer: No Typology Code available for payment source | Source: Ambulatory Visit | Attending: Neurosurgery | Admitting: Neurosurgery

## 2022-02-21 DIAGNOSIS — Z9889 Other specified postprocedural states: Secondary | ICD-10-CM | POA: Diagnosis not present

## 2022-02-21 DIAGNOSIS — D329 Benign neoplasm of meninges, unspecified: Secondary | ICD-10-CM

## 2022-02-21 DIAGNOSIS — G9389 Other specified disorders of brain: Secondary | ICD-10-CM | POA: Diagnosis not present

## 2022-02-21 IMAGING — MR MR HEAD WO/W CM
14 series · 48 of 48 positions shown · IV contrast (multihance)
Comparison: MRI of the brain [DATE].

CLINICAL DATA: Meningioma (HCC) [XC] ([XC]-CM).

EXAM:
MRI HEAD WITHOUT AND WITH CONTRAST
TECHNIQUE: Multiplanar, multiecho pulse sequences of the brain and surrounding
structures were obtained without and with intravenous contrast.
CONTRAST:  10mL MULTIHANCE GADOBENATE DIMEGLUMINE 529 MG/ML IV SOLN

[Series 5: T1 · sagittal · 4.0mm · 0.75mm/px · 1 of 31 slices shown (1 of 3)]
[im 1/31]
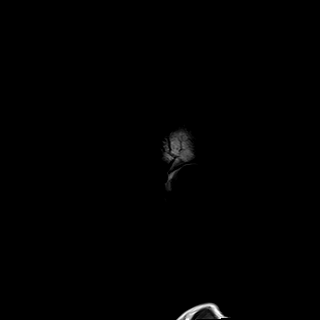

[Series 6: DWI · axial · 3.0mm · 0.94mm/px · z∈[-44,+97]mm · 8 of 160 slices shown (1 of 3)]
[im 1/160]
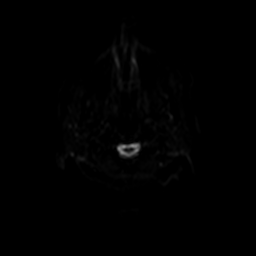
[im 23/160]
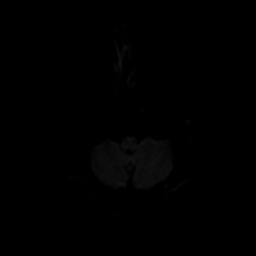
[im 46/160]
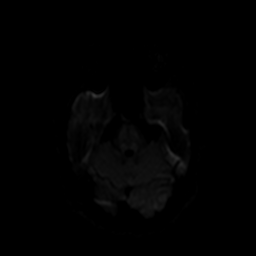
[im 69/160]
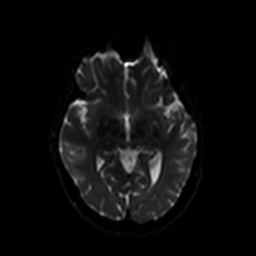
[im 91/160]
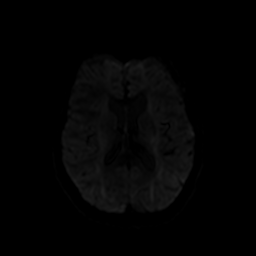
[im 114/160]
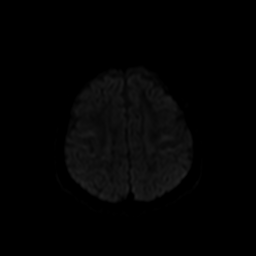
[im 137/160]
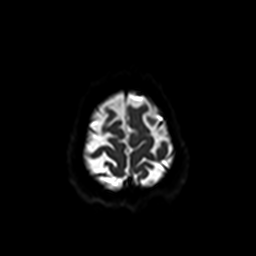
[im 160/160]
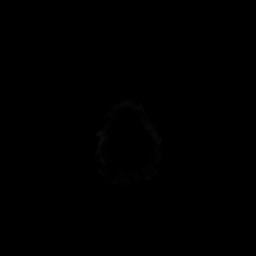

[Series 7: ax dwi_tracew · axial · 3.0mm · 0.94mm/px · z∈[-44,+97]mm · 4 of 80 slices shown]
[im 1/80]
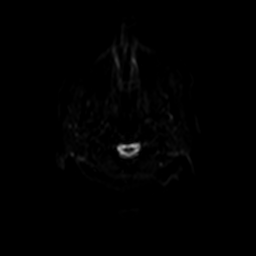
[im 27/80]
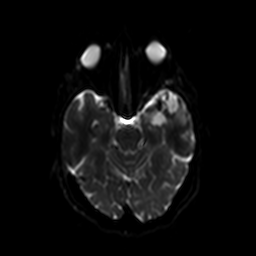
[im 53/80]
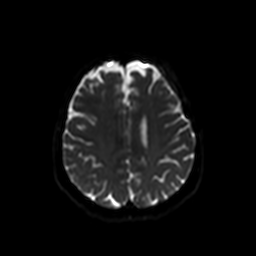
[im 80/80]
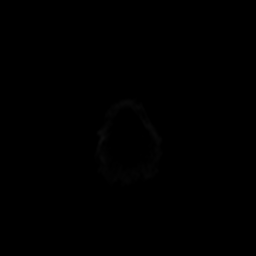

[Series 8: ax dwi_adc · axial · 3.0mm · 0.94mm/px · z∈[-44,+97]mm · 2 of 38 slices shown]
[im 1/38]
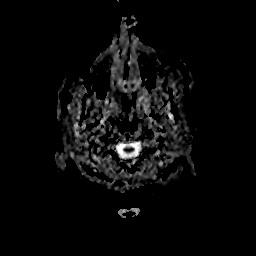
[im 38/38]
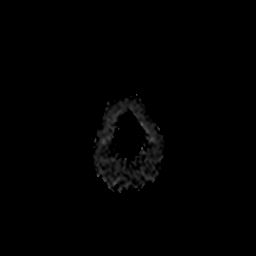

[Series 9: DWI · coronal · 5.0mm · 1.44mm/px · 3 of 60 slices shown (2 of 3)]
[im 1/60]
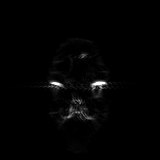
[im 30/60]
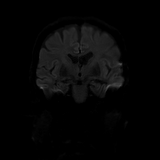
[im 60/60]
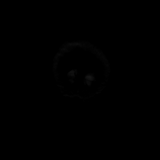

[Series 10: DWI · coronal · 5.0mm · 1.44mm/px · 1 of 30 slices shown (3 of 3)]
[im 1/30]
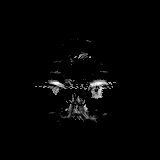

[Series 11: T2 · axial · 4.0mm · 0.36mm/px · 1 of 27 slices shown]
[im 1/27]
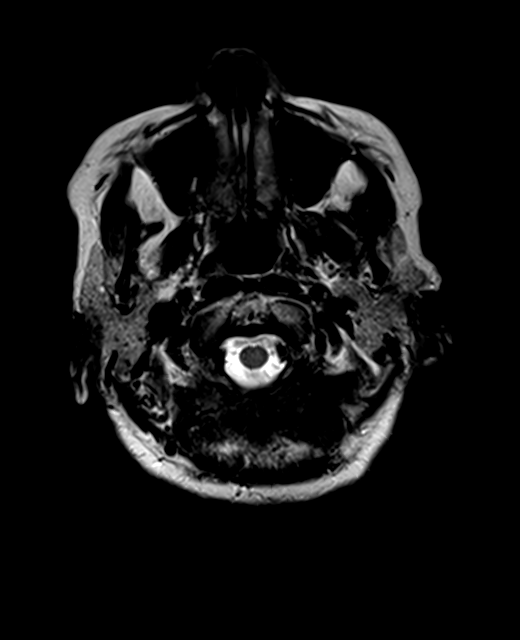

[Series 12: FLAIR · axial · 3.0mm · 0.72mm/px · 1 of 26 slices shown]
[im 1/26]
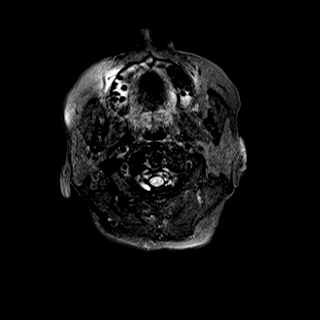

[Series 14: swi_images · axial · 1.5mm · 0.90mm/px · z∈[-48,+95]mm · 5 of 96 slices shown]
[im 1/96]
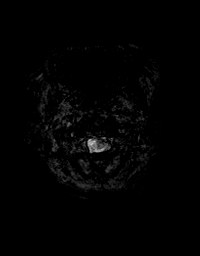
[im 24/96]
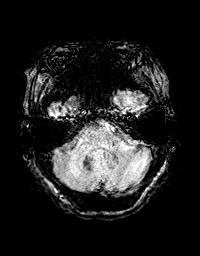
[im 48/96]
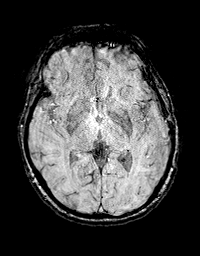
[im 72/96]
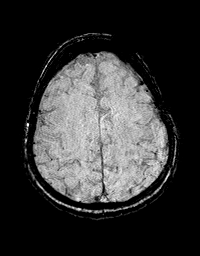
[im 96/96]
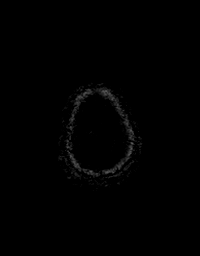

[Series 15: T1 · axial · 1.0mm · 0.94mm/px · z∈[-67,+91]mm · 8 of 160 slices shown (2 of 3)]
[im 1/160]
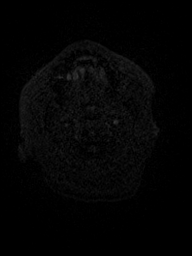
[im 23/160]
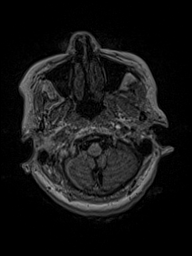
[im 46/160]
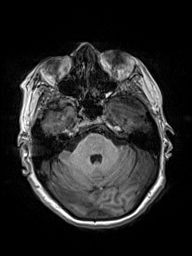
[im 69/160]
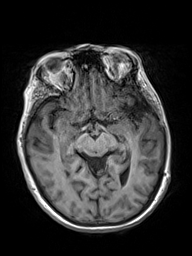
[im 91/160]
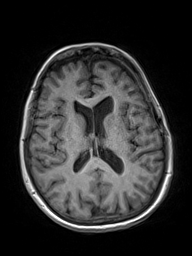
[im 114/160]
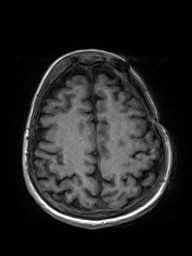
[im 137/160]
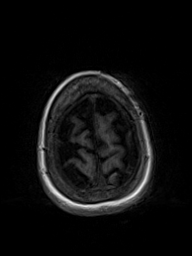
[im 160/160]
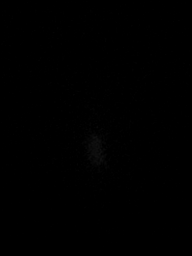

[Series 16: T2 post-contrast · coronal · 4.0mm · 0.36mm/px · 2 of 32 slices shown]
[im 1/32]
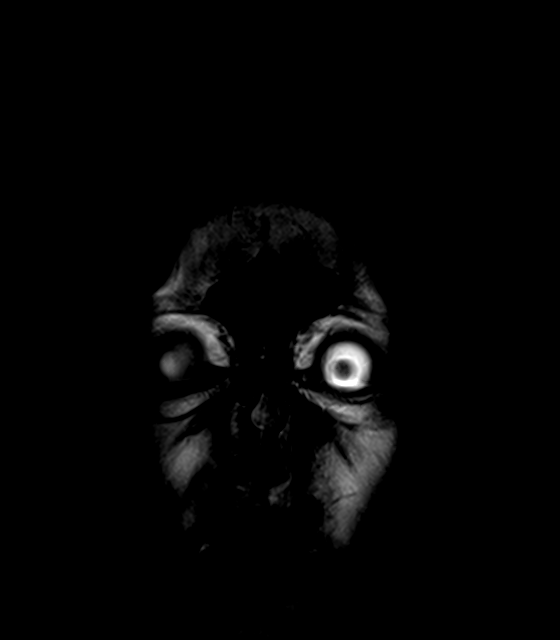
[im 32/32]
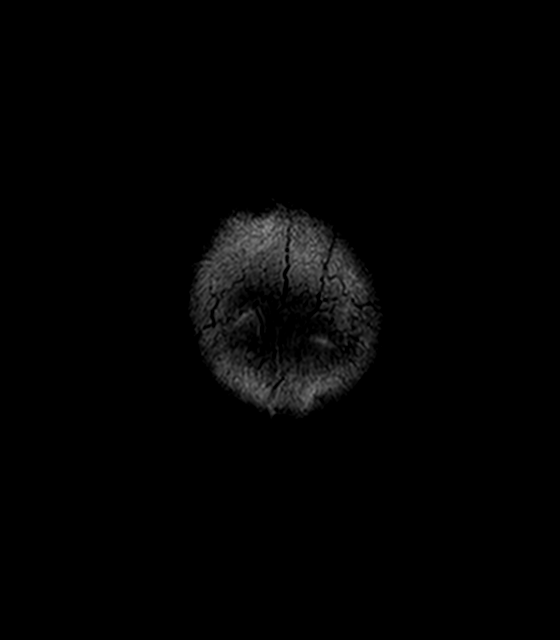

[Series 17: T1 post-contrast · coronal · 4.0mm · 0.72mm/px · 2 of 32 slices shown (1 of 2)]
[im 1/32]
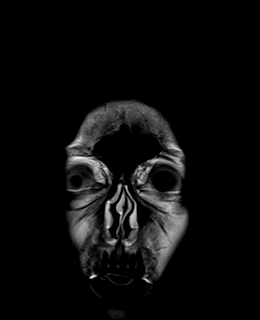
[im 32/32]
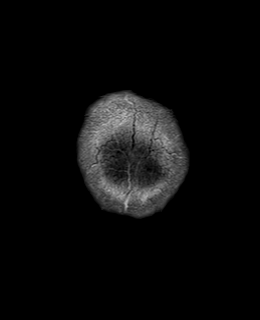

[Series 18: T1 · axial · 1.0mm · 0.94mm/px · z∈[-67,+91]mm · 8 of 160 slices shown (3 of 3)]
[im 1/160]
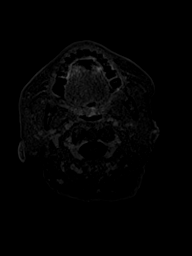
[im 23/160]
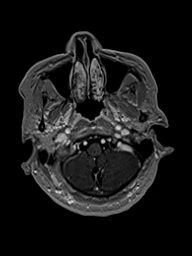
[im 46/160]
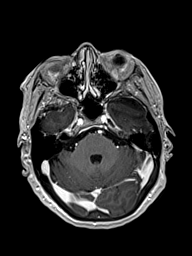
[im 69/160]
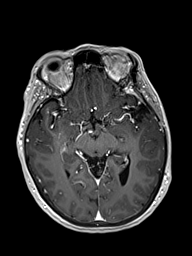
[im 91/160]
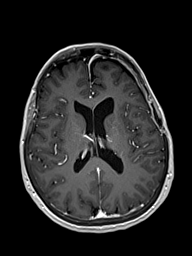
[im 114/160]
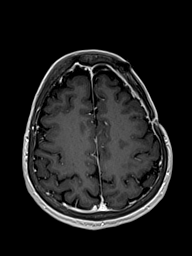
[im 137/160]
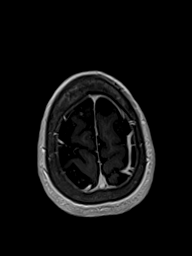
[im 160/160]
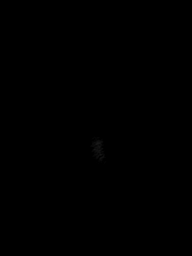

[Series 19: T1 post-contrast · sagittal · 4.0mm · 0.75mm/px · 2 of 31 slices shown (2 of 2)]
[im 1/31]
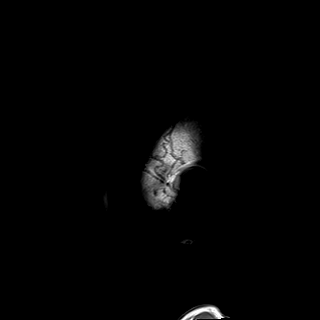
[im 31/31]
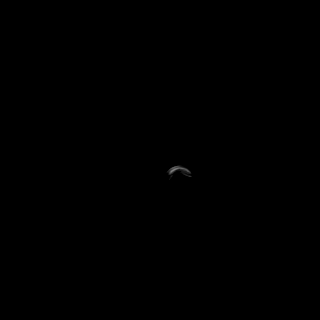

[48 of 48 positions shown; findings below may reference images not displayed]

FINDINGS: Brain: No acute infarction, hemorrhage or hydrocephalus.

Small residual left frontal subdural fluid collection has diminished
in size when compared to prior MRI, now measuring up to 4 mm in
thickness compared to 6.5 mm on prior. Dural thickening and contrast
enhancement in the left frontal region is also slightly less
evident. No nodular area of contrast enhancement to suggest residual
or recurrent tumor.

Area of encephalomalacia and gliosis with hemosiderin deposit in the
anterior left temporal lobe is unchanged. Remainder of the brain
parenchyma has signal characteristics.

Vascular: Normal flow voids.

Skull and upper cervical spine: Postsurgical changes from prior left
pterional craniotomy. No focal marrow lesion identified.

Sinuses/Orbits: Negative.

Other: None.
IMPRESSION: 1. Postsurgical changes from large left middle cranial fossa
meningioma resection with unchanged area of encephalomalacia and
gliosis in the left temporal lobe.
2. Interval decrease in size of the small anterior left frontal
subdural fluid collection and dural thickening.
3. No focus of nodular contrast enhancement to suggest residual or
recurrent tumor.

## 2022-02-21 MED ORDER — GADOBENATE DIMEGLUMINE 529 MG/ML IV SOLN
10.0000 mL | Freq: Once | INTRAVENOUS | Status: AC | PRN
  Administered 2022-02-21: 10 mL via INTRAVENOUS

## 2022-02-24 DIAGNOSIS — D329 Benign neoplasm of meninges, unspecified: Secondary | ICD-10-CM | POA: Diagnosis not present

## 2022-02-24 DIAGNOSIS — R4586 Emotional lability: Secondary | ICD-10-CM | POA: Diagnosis not present

## 2022-03-11 ENCOUNTER — Telehealth: Payer: Self-pay | Admitting: Family Medicine

## 2022-03-11 DIAGNOSIS — F4320 Adjustment disorder, unspecified: Secondary | ICD-10-CM

## 2022-03-11 NOTE — Telephone Encounter (Signed)
Neurosurgeon referred her out and they don't have availability. Asking if we can get her an appointment with a neuropsychologist Ilean Skill). Feels she is going through a lot of emotions right now and needs assistance in getting this scheduled sooner

## 2022-03-12 NOTE — Telephone Encounter (Signed)
Referral placed and pt aware   

## 2022-04-14 ENCOUNTER — Other Ambulatory Visit: Payer: Self-pay | Admitting: Family Medicine

## 2022-04-15 ENCOUNTER — Telehealth: Payer: Self-pay | Admitting: Family Medicine

## 2022-04-15 NOTE — Telephone Encounter (Signed)
Pt called to inform us that Pharmacy refused to fill her Rx for:   amLODipine (NORVASC) 5 MG tablet  Pt was very concerned & very worried because she does not want to miss taking her medication, as she is afraid of having a stroke.   As per Pt: Please authorize request and send [facility]:  [id number]:  [phone] Fax:  [phone]   As soon as possible.  Pt's new # [phone]

## 2022-04-15 NOTE — Telephone Encounter (Signed)
Last OV 3/2/723 Last CPE 04/21/21  Pt notified that CPE is due. Appt scheduled for July 24. 1 month refill given per [facility] refill protocol.

## 2022-04-19 ENCOUNTER — Ambulatory Visit (INDEPENDENT_AMBULATORY_CARE_PROVIDER_SITE_OTHER): Payer: No Typology Code available for payment source | Admitting: Family Medicine

## 2022-04-19 ENCOUNTER — Encounter: Payer: Self-pay | Admitting: Family Medicine

## 2022-04-19 VITALS — BP 134/68 | HR 60 | Temp 98.0°F | Ht <= 58 in | Wt 105.7 lb

## 2022-04-19 DIAGNOSIS — Z0000 Encounter for general adult medical examination without abnormal findings: Secondary | ICD-10-CM

## 2022-04-19 DIAGNOSIS — Z780 Asymptomatic menopausal state: Secondary | ICD-10-CM | POA: Diagnosis not present

## 2022-04-19 LAB — CBC WITH DIFFERENTIAL/PLATELET
Basophils Absolute: 0 10*3/uL (ref 0.0–0.1)
Basophils Relative: 0.6 % (ref 0.0–3.0)
Eosinophils Absolute: 0.1 10*3/uL (ref 0.0–0.7)
Eosinophils Relative: 1.3 % (ref 0.0–5.0)
HCT: 41.2 % (ref 36.0–46.0)
Hemoglobin: 13.6 g/dL (ref 12.0–15.0)
Lymphocytes Relative: 31.2 % (ref 12.0–46.0)
Lymphs Abs: 1.8 10*3/uL (ref 0.7–4.0)
MCHC: 32.9 g/dL (ref 30.0–36.0)
MCV: 95.3 fl (ref 78.0–100.0)
Monocytes Absolute: 0.4 10*3/uL (ref 0.1–1.0)
Monocytes Relative: 6.5 % (ref 3.0–12.0)
Neutro Abs: 3.4 10*3/uL (ref 1.4–7.7)
Neutrophils Relative %: 60.4 % (ref 43.0–77.0)
Platelets: 163 10*3/uL (ref 150.0–400.0)
RBC: 4.32 Mil/uL (ref 3.87–5.11)
RDW: 13.9 % (ref 11.5–15.5)
WBC: 5.7 10*3/uL (ref 4.0–10.5)

## 2022-04-19 LAB — BASIC METABOLIC PANEL
BUN: 16 mg/dL (ref 6–23)
CO2: 30 mEq/L (ref 19–32)
Calcium: 9.3 mg/dL (ref 8.4–10.5)
Chloride: 104 mEq/L (ref 96–112)
Creatinine, Ser: 0.68 mg/dL (ref 0.40–1.20)
GFR: 90.72 mL/min (ref >60.00)
Glucose, Bld: 108 mg/dL — ABNORMAL HIGH (ref 70–99)
Potassium: 4.1 mEq/L (ref 3.5–5.1)
Sodium: 139 mEq/L (ref 135–145)

## 2022-04-19 LAB — LIPID PANEL
Cholesterol: 217 mg/dL — ABNORMAL HIGH (ref 0–200)
HDL: 56.5 mg/dL (ref >39.00)
LDL Cholesterol: 126 mg/dL — ABNORMAL HIGH (ref 0–99)
NonHDL: 160.43
Total CHOL/HDL Ratio: 4
Triglycerides: 171 mg/dL — ABNORMAL HIGH (ref 0.0–149.0)
VLDL: 34.2 mg/dL (ref 0.0–40.0)

## 2022-04-19 LAB — HEPATIC FUNCTION PANEL
ALT: 13 U/L (ref 0–35)
AST: 17 U/L (ref 0–37)
Albumin: 4.1 g/dL (ref 3.5–5.2)
Alkaline Phosphatase: 49 U/L (ref 39–117)
Bilirubin, Direct: 0.1 mg/dL (ref 0.0–0.3)
Total Bilirubin: 0.5 mg/dL (ref 0.2–1.2)
Total Protein: 6.6 g/dL (ref 6.0–8.3)

## 2022-04-19 LAB — TSH: TSH: 1.15 u[IU]/mL (ref 0.35–5.50)

## 2022-04-19 MED ORDER — AMLODIPINE BESYLATE 5 MG PO TABS: 5.0000 mg | ORAL_TABLET | Freq: Every day | ORAL | 3 refills | Status: DC

## 2022-04-19 NOTE — Patient Instructions (Addendum)
Consider Shingrix vaccine. Can get this at pharmacy.    Set up bone density exam.

## 2022-04-19 NOTE — Progress Notes (Signed)
Established Patient Office Visit  Subjective   Patient ID: Barbara Kim, female    DOB: Aug 02, 1956  Age: 66 y.o. MRN: [id number]  Chief Complaint  Patient presents with   Annual Exam    HPI   Here for physical exam.  She still sees gynecologist and is not sure but thinks she may have had recent DEXA scan.  Mammograms up-to-date.  She picked up her exercise since March and has lost about 12 pounds due to her efforts.  Feels good overall.  She had recent follow-up with neurosurgeon following meningioma excision the year before.  MRI scan in May showed no residual tumor or recurrence.  There had been some question from family about personality changes and she has pending follow-up with neuropsychologist for January.  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  Overall feels well.  Health maintenance reviewed  -Prevnar 20 already given -No history of Shingrix -Possible DEXA scan per GYN but we have no record -Mammogram up-to-date -She has not had colonoscopy but does agree with getting this set up  Family history unknown as she is adopted.  Social history-married.  She has a daughter.  Non-smoker.  No regular alcohol.  Works as a Geophysicist/field seismologist  Past Medical History:  Diagnosis Date   Chicken pox    Hypertension    Hyperthyroidism    25 years ago   Past Surgical History:  Procedure Laterality Date   APPLICATION OF CRANIAL NAVIGATION Left 02/20/2021   Procedure: APPLICATION OF CRANIAL NAVIGATION;  Surgeon: Consuella Lose, MD;  Location: [facility];  Service: Neurosurgery;  Laterality: Left;   CRANIOTOMY Left 02/20/2021   Procedure: Stereotactic LEFT pterional craniotomy for resection of meningioma;  Surgeon: Consuella Lose, MD;  Location: [place];  Service: Neurosurgery;  Laterality: Left;   TONSILLECTOMY  1983    reports that she has never smoked. She has never used smokeless tobacco. She reports that she does not drink alcohol and does  not use drugs. family history is not on file. She was adopted. No Known Allergies  Review of Systems  Constitutional:  Negative for chills, fever and malaise/fatigue.  HENT:  Negative for hearing loss.   Eyes:  Negative for blurred vision.  Respiratory:  Negative for shortness of breath.   Cardiovascular:  Negative for chest pain.  Gastrointestinal:  Negative for abdominal pain, nausea and vomiting.  Genitourinary:  Negative for dysuria.  Skin:  Negative for rash.  Neurological:  Negative for dizziness, weakness and headaches.      Objective:     BP 134/68 (BP Location: Left Arm, Patient Position: Sitting, Cuff Size: Normal)   Pulse 60   Temp 98 F (36.7 C) (Oral)   Ht 4' 8.3" (1.43 m)   Wt 105 lb 11.2 oz (47.9 kg)   SpO2 98%   BMI 23.45 kg/m  BP Readings from Last 3 Encounters:  04/19/22 134/68  01/11/22 120/64  12/21/21 (!) 170/70   Wt Readings from Last 3 Encounters:  04/19/22 105 lb 11.2 oz (47.9 kg)  01/11/22 117 lb 14.4 oz (53.5 kg)  12/21/21 117 lb 3.2 oz (53.2 kg)      Physical Exam Constitutional:      Appearance: She is well-developed.  HENT:     Head: Normocephalic and atraumatic.  Eyes:     Pupils: Pupils are equal, round, and reactive to light.  Neck:     Thyroid: No thyromegaly.  Cardiovascular:     Rate and Rhythm:  Normal rate and regular rhythm.     Heart sounds: Normal heart sounds. No murmur heard. Pulmonary:     Effort: No respiratory distress.     Breath sounds: Normal breath sounds. No wheezing or rales.  Abdominal:     General: Bowel sounds are normal. There is no distension.     Palpations: Abdomen is soft. There is no mass.     Tenderness: There is no abdominal tenderness. There is no guarding or rebound.  Musculoskeletal:        General: Normal range of motion.     Cervical back: Normal range of motion and neck supple.     Right lower leg: No edema.     Left lower leg: No edema.  Lymphadenopathy:     Cervical: No cervical  adenopathy.  Skin:    Findings: No rash.  Neurological:     Mental Status: She is alert and oriented to person, place, and time.     Cranial Nerves: No cranial nerve deficit.  Psychiatric:        Behavior: Behavior normal.        Thought Content: Thought content normal.        Judgment: Judgment normal.      No results found for any visits on 04/19/22.    The 10-year ASCVD risk score (Arnett DK, et al., 2019) is: 9.7%    Assessment & Plan:   Problem List Items Addressed This Visit   None Visit Diagnoses     Physical exam    -  Primary   Relevant Orders   Ambulatory referral to Gastroenterology   Basic metabolic panel   Lipid panel   CBC with Differential/Platelet   TSH   Hepatic function panel   Postmenopausal       Relevant Orders   DG Bone Density     -We discussed DEXA scan but appears she may have had this through GYN.  She will confirm. -We advised her to consider Shingrix vaccine and she will check at the pharmacy -Set up screening colonoscopy -Continue at least every other year mammogram.  She is getting these through her GYN -Obtain screening labs as above -Continue annual flu vaccine  No follow-ups on file.    Carolann Littler, MD

## 2022-05-24 DIAGNOSIS — R4586 Emotional lability: Secondary | ICD-10-CM | POA: Diagnosis not present

## 2022-05-24 DIAGNOSIS — D329 Benign neoplasm of meninges, unspecified: Secondary | ICD-10-CM | POA: Diagnosis not present

## 2022-06-09 DIAGNOSIS — L815 Leukoderma, not elsewhere classified: Secondary | ICD-10-CM | POA: Diagnosis not present

## 2022-06-09 DIAGNOSIS — L821 Other seborrheic keratosis: Secondary | ICD-10-CM | POA: Diagnosis not present

## 2022-06-09 DIAGNOSIS — L814 Other melanin hyperpigmentation: Secondary | ICD-10-CM | POA: Diagnosis not present

## 2022-06-09 DIAGNOSIS — D2239 Melanocytic nevi of other parts of face: Secondary | ICD-10-CM | POA: Diagnosis not present

## 2022-06-29 ENCOUNTER — Ambulatory Visit (AMBULATORY_SURGERY_CENTER): Payer: Self-pay

## 2022-06-29 ENCOUNTER — Telehealth: Payer: Self-pay

## 2022-06-29 VITALS — Ht <= 58 in | Wt 103.0 lb

## 2022-06-29 DIAGNOSIS — Z1211 Encounter for screening for malignant neoplasm of colon: Secondary | ICD-10-CM

## 2022-06-29 NOTE — Telephone Encounter (Signed)
Telephone PV today at 11am. No answer @ 11, 11:03, 11:07, 11:15. Message left to please call back to reschedule PV. If no call back by 5pm, colonoscopy 10/16 will have to be cancelled.

## 2022-06-29 NOTE — Progress Notes (Signed)
No egg or soy allergy known to patient  No issues known to pt with past sedation with any surgeries or procedures Patient denies ever being told they had issues or difficulty with intubation  No FH of Malignant Hyperthermia Pt is not on diet pills Pt is not on  home 02  Pt is not on blood thinners  Pt denies issues with constipation  No A fib or A flutter Have any cardiac testing pending--denied  PV was conducted over phone. Instructions mailed to verified address and sent via [place].

## 2022-07-04 ENCOUNTER — Encounter: Payer: Self-pay | Admitting: Certified Registered Nurse Anesthetist

## 2022-07-05 ENCOUNTER — Encounter: Payer: Self-pay | Admitting: Internal Medicine

## 2022-07-09 ENCOUNTER — Telehealth: Payer: Self-pay | Admitting: Internal Medicine

## 2022-07-09 ENCOUNTER — Encounter: Payer: Self-pay | Admitting: *Deleted

## 2022-07-09 NOTE — Telephone Encounter (Signed)
Inbound call from patient , she is schedule for a Colonoscopy 07/12/2022 , patient states she dont have her instructions and went to pharmacy to pick up her pre medication and she was told that there was not prescription sent.Please advise

## 2022-07-09 NOTE — Telephone Encounter (Signed)
Returned patient call, patient has not received instructions.  She is coming by today to pick up another set of instructions.

## 2022-07-12 ENCOUNTER — Ambulatory Visit (AMBULATORY_SURGERY_CENTER): Payer: No Typology Code available for payment source | Admitting: Internal Medicine

## 2022-07-12 ENCOUNTER — Encounter: Payer: Self-pay | Admitting: Internal Medicine

## 2022-07-12 VITALS — BP 181/73 | HR 53 | Temp 98.7°F | Resp 10 | Ht <= 58 in | Wt 103.0 lb

## 2022-07-12 DIAGNOSIS — D122 Benign neoplasm of ascending colon: Secondary | ICD-10-CM

## 2022-07-12 DIAGNOSIS — D123 Benign neoplasm of transverse colon: Secondary | ICD-10-CM

## 2022-07-12 DIAGNOSIS — Z1211 Encounter for screening for malignant neoplasm of colon: Secondary | ICD-10-CM

## 2022-07-12 DIAGNOSIS — I10 Essential (primary) hypertension: Secondary | ICD-10-CM | POA: Diagnosis not present

## 2022-07-12 DIAGNOSIS — K635 Polyp of colon: Secondary | ICD-10-CM | POA: Diagnosis not present

## 2022-07-12 DIAGNOSIS — D125 Benign neoplasm of sigmoid colon: Secondary | ICD-10-CM

## 2022-07-12 MED ORDER — SODIUM CHLORIDE 0.9 % IV SOLN: 500.0000 mL | Freq: Once | INTRAVENOUS | Status: DC

## 2022-07-12 NOTE — Patient Instructions (Addendum)
-   Discharge patient to home (with escort). - Await pathology results. - The findings and recommendations were discussed with the patient.  Handout on polyps given.  YOU HAD AN ENDOSCOPIC PROCEDURE TODAY AT THE [facility]:   Refer to the procedure report that was given to you for any specific questions about what was found during the examination.  If the procedure report does not answer your questions, please call your gastroenterologist to clarify.  If you requested that your care partner not be given the details of your procedure findings, then the procedure report has been included in a sealed envelope for you to review at your convenience later.  YOU SHOULD EXPECT: Some feelings of bloating in the abdomen. Passage of more gas than usual.  Walking can help get rid of the air that was put into your GI tract during the procedure and reduce the bloating. If you had a lower endoscopy (such as a colonoscopy or flexible sigmoidoscopy) you may notice spotting of blood in your stool or on the toilet paper. If you underwent a bowel prep for your procedure, you may not have a normal bowel movement for a few days.  Please Note:  You might notice some irritation and congestion in your nose or some drainage.  This is from the oxygen used during your procedure.  There is no need for concern and it should clear up in a day or so.  SYMPTOMS TO REPORT IMMEDIATELY:  Following lower endoscopy (colonoscopy or flexible sigmoidoscopy):  Excessive amounts of blood in the stool  Significant tenderness or worsening of abdominal pains  Swelling of the abdomen that is new, acute  Fever of 100F or higher  For urgent or emergent issues, a gastroenterologist can be reached at any hour by calling [phone]. Do not use MyChart messaging for urgent concerns.    DIET:  We do recommend a small meal at first, but then you may proceed to your regular diet.  Drink plenty of fluids but you should avoid  alcoholic beverages for 24 hours.  ACTIVITY:  You should plan to take it easy for the rest of today and you should NOT DRIVE or use heavy machinery until tomorrow (because of the sedation medicines used during the test).    FOLLOW UP: Our staff will call the number listed on your records the next business day following your procedure.  We will call around 7:15- 8:00 am to check on you and address any questions or concerns that you may have regarding the information given to you following your procedure. If we do not reach you, we will leave a message.     If any biopsies were taken you will be contacted by phone or by letter within the next 1-3 weeks.  Please call us at [phone] if you have not heard about the biopsies in 3 weeks.    SIGNATURES/CONFIDENTIALITY: You and/or your care partner have signed paperwork which will be entered into your electronic medical record.  These signatures attest to the fact that that the information above on your After Visit Summary has been reviewed and is understood.  Full responsibility of the confidentiality of this discharge information lies with you and/or your care-partner. 

## 2022-07-12 NOTE — Progress Notes (Signed)
VS completed by [facility].   Pt's states no medical or surgical changes since previsit or office visit.  

## 2022-07-12 NOTE — Op Note (Signed)
[facility] Patient Name: Ayza Ripoll Procedure Date: 07/12/2022 8:02 AM MRN: [id number] Endoscopist: Sonny Masters "Christia Reading ,  Age: 66 Referring MD:  Date of Birth: November 17, 1955 Gender: Female Account #: [id number] Procedure:                Colonoscopy Indications:              Screening for colorectal malignant neoplasm, This                            is the patient's first colonoscopy Medicines:                Monitored Anesthesia Care Procedure:                Pre-Anesthesia Assessment:                           - Prior to the procedure, a History and Physical                            was performed, and patient medications and                            allergies were reviewed. The patient's tolerance of                            previous anesthesia was also reviewed. The risks                            and benefits of the procedure and the sedation                            options and risks were discussed with the patient.                            All questions were answered, and informed consent                            was obtained. Prior Anticoagulants: The patient has                            taken no previous anticoagulant or antiplatelet                            agents. ASA Grade Assessment: II - A patient with                            mild systemic disease. After reviewing the risks                            and benefits, the patient was deemed in                            satisfactory condition to undergo the procedure.  After obtaining informed consent, the colonoscope                            was passed under direct vision. Throughout the                            procedure, the patient's blood pressure, pulse, and                            oxygen saturations were monitored continuously. The                            PCF-HQ190L Colonoscope was introduced through the                            anus and advanced  to the the terminal ileum. The                            colonoscopy was performed without difficulty. The                            patient tolerated the procedure well. The quality                            of the bowel preparation was good. The terminal                            ileum, ileocecal valve, appendiceal orifice, and                            rectum were photographed. Scope In: 8:21:45 AM Scope Out: 8:44:47 AM Scope Withdrawal Time: 0 hours 18 minutes 37 seconds  Total Procedure Duration: 0 hours 23 minutes 2 seconds  Findings:                 The terminal ileum appeared normal.                           Five sessile polyps were found in the transverse                            colon and ascending colon. The polyps were 3 to 5                            mm in size. These polyps were removed with a cold                            snare. Resection and retrieval were complete.                           Scattered diverticula were found in the sigmoid                            colon and ascending colon.  A 4 mm polyp was found in the sigmoid colon. The                            polyp was sessile. The polyp was removed with a                            cold snare. Resection and retrieval were complete.                           Non-bleeding internal hemorrhoids were found during                            retroflexion. Complications:            No immediate complications. Estimated Blood Loss:     Estimated blood loss was minimal. Impression:               - The examined portion of the ileum was normal.                           - Five 3 to 5 mm polyps in the transverse colon and                            in the ascending colon, removed with a cold snare.                            Resected and retrieved.                           - Diverticulosis in the sigmoid colon and in the                            ascending colon.                            - One 4 mm polyp in the sigmoid colon, removed with                            a cold snare. Resected and retrieved.                           - Non-bleeding internal hemorrhoids. Recommendation:           - Discharge patient to home (with escort).                           - Await pathology results.                           - The findings and recommendations were discussed                            with the patient. Dr Georgian Co "Lyndee Leo" Lorenso Courier,  07/12/2022 8:56:41 AM

## 2022-07-12 NOTE — Progress Notes (Signed)
GASTROENTEROLOGY PROCEDURE H&P NOTE   Primary Care Physician: Eulas Post, MD    Reason for Procedure:   Colon cancer screening  Plan:    Colonoscopy  Patient is appropriate for endoscopic procedure(s) in the ambulatory ([facility]) setting.  The nature of the procedure, as well as the risks, benefits, and alternatives were carefully and thoroughly reviewed with the patient. Ample time for discussion and questions allowed. The patient understood, was satisfied, and agreed to proceed.     HPI: Ahyana Skillin is a 66 y.o. female who presents for colonoscopy for colon cancer screening. Denies blood in stools, changes in bowel habits, or unintentional weight loss. Denies family history of colon cancer.  Past Medical History:  Diagnosis Date   Chicken pox    Hypertension    Hyperthyroidism    25 years ago    Past Surgical History:  Procedure Laterality Date   APPLICATION OF CRANIAL NAVIGATION Left 02/20/2021   Procedure: APPLICATION OF CRANIAL NAVIGATION;  Surgeon: Consuella Lose, MD;  Location: [facility];  Service: Neurosurgery;  Laterality: Left;   CRANIOTOMY Left 02/20/2021   Procedure: Stereotactic LEFT pterional craniotomy for resection of meningioma;  Surgeon: Consuella Lose, MD;  Location: [facility];  Service: Neurosurgery;  Laterality: Left;   TONSILLECTOMY  1983    Prior to Admission medications   Medication Sig Start Date End Date Taking? Authorizing Provider  amLODipine (NORVASC) 5 MG tablet Take 1 tablet (5 mg total) by mouth daily. 04/19/22  Yes Burchette, Alinda Sierras, MD  Vitamin D, Ergocalciferol, (DRISDOL) 50000 units CAPS capsule TAKE 1 CAPSULE (50,000 UNITS TOTAL) BY MOUTH EVERY 7 (SEVEN) DAYS. 06/03/16  Yes Burchette, Alinda Sierras, MD  alendronate (FOSAMAX) 70 MG tablet Take 70 mg by mouth once a week. 06/02/22   [provider]  triamcinolone cream (KENALOG) 0.1 % Apply 1 application. topically 2 (two) times daily as needed. 12/21/21   Burchette, Alinda Sierras, MD     Current Outpatient Medications  Medication Sig Dispense Refill   amLODipine (NORVASC) 5 MG tablet Take 1 tablet (5 mg total) by mouth daily. 90 tablet 3   Vitamin D, Ergocalciferol, (DRISDOL) 50000 units CAPS capsule TAKE 1 CAPSULE (50,000 UNITS TOTAL) BY MOUTH EVERY 7 (SEVEN) DAYS. 4 capsule 0   alendronate (FOSAMAX) 70 MG tablet Take 70 mg by mouth once a week.     triamcinolone cream (KENALOG) 0.1 % Apply 1 application. topically 2 (two) times daily as needed. 30 g 2   Current Facility-Administered Medications  Medication Dose Route Frequency Provider Last Rate Last Admin   0.9 %  sodium chloride infusion  500 mL Intravenous Once Sharyn Creamer, MD        Allergies as of 07/12/2022 - Review Complete 07/12/2022  Allergen Reaction Noted   Corn-containing products Dermatitis 06/29/2022    Family History  Adopted: Yes    Social History   Socioeconomic History   Marital status: Unknown    Spouse name: Not on file   Number of children: Not on file   Years of education: Not on file   Highest education level: Not on file  Occupational History   Not on file  Tobacco Use   Smoking status: Never   Smokeless tobacco: Never  Vaping Use   Vaping Use: Never used  Substance and Sexual Activity   Alcohol use: No   Drug use: No   Sexual activity: Not on file  Other Topics Concern   Not on file  Social History Narrative  Not on file   Social Determinants of Health   Financial Resource Strain: Low Risk  (01/11/2022)   Overall Financial Resource Strain (CARDIA)    Difficulty of Paying Living Expenses: Not hard at all  Food Insecurity: No Food Insecurity (01/11/2022)   Hunger Vital Sign    Worried About Running Out of Food in the Last Year: Never true    Ran Out of Food in the Last Year: Never true  Transportation Needs: No Transportation Needs (01/11/2022)   PRAPARE - Hydrologist (Medical): No    Lack of Transportation (Non-Medical): No   Physical Activity: Sufficiently Active (01/11/2022)   Exercise Vital Sign    Days of Exercise per Week: 6 days    Minutes of Exercise per Session: 60 min  Stress: No Stress Concern Present (01/11/2022)   [place]    Feeling of Stress : Not at all  Social Connections: [place] (01/11/2022)   Social Connection and Isolation Panel [NHANES]    Frequency of Communication with Friends and Family: More than three times a week    Frequency of Social Gatherings with Friends and Family: More than three times a week    Attends Religious Services: More than 4 times per year    Active Member of [employer] or Organizations: Yes    Attends Music therapist: More than 4 times per year    Marital Status: Married  Human resources officer Violence: Not At Risk (01/11/2022)   Humiliation, Afraid, Rape, and Kick questionnaire    Fear of Current or Ex-Partner: No    Emotionally Abused: No    Physically Abused: No    Sexually Abused: No    Physical Exam: Vital signs in last 24 hours: BP (!) 166/76   Pulse (!) 50   Temp 98.7 F (37.1 C) (Temporal)   Resp 15   Ht '4\' 8"'$  (1.422 m)   Wt 103 lb (46.7 kg)   SpO2 99%   BMI 23.09 kg/m  GEN: NAD EYE: Sclerae anicteric ENT: MMM CV: Non-tachycardic Pulm: No increased work of breathing GI: Soft, NT/ND NEURO:  Alert & Oriented   Christia Reading, MD [facility]  07/12/2022 8:20 AM

## 2022-07-12 NOTE — Progress Notes (Signed)
Called to room to assist during endoscopic procedure.  Patient ID and intended procedure confirmed with present staff. Received instructions for my participation in the procedure from the performing physician.  

## 2022-07-12 NOTE — Progress Notes (Signed)
Report given to PACU, vss 

## 2022-07-13 ENCOUNTER — Telehealth: Payer: Self-pay

## 2022-07-13 NOTE — Telephone Encounter (Signed)
Left message on follow up call. 

## 2022-07-14 ENCOUNTER — Encounter: Payer: Self-pay | Admitting: Internal Medicine

## 2022-08-29 ENCOUNTER — Other Ambulatory Visit: Payer: Self-pay | Admitting: Family Medicine

## 2022-08-29 DIAGNOSIS — M810 Age-related osteoporosis without current pathological fracture: Secondary | ICD-10-CM

## 2022-09-08 ENCOUNTER — Encounter: Payer: Self-pay | Admitting: Family Medicine

## 2022-09-08 ENCOUNTER — Ambulatory Visit (INDEPENDENT_AMBULATORY_CARE_PROVIDER_SITE_OTHER): Payer: No Typology Code available for payment source | Admitting: Family Medicine

## 2022-09-08 VITALS — BP 124/60 | HR 56 | Temp 97.9°F | Ht <= 58 in | Wt 104.9 lb

## 2022-09-08 DIAGNOSIS — R062 Wheezing: Secondary | ICD-10-CM

## 2022-09-08 DIAGNOSIS — R051 Acute cough: Secondary | ICD-10-CM

## 2022-09-08 MED ORDER — METHYLPREDNISOLONE ACETATE 80 MG/ML IJ SUSP
80.0000 mg | Freq: Once | INTRAMUSCULAR | Status: AC
  Administered 2022-09-08: 80 mg via INTRAMUSCULAR

## 2022-09-08 NOTE — Progress Notes (Signed)
   Established Patient Office Visit  Subjective   Patient ID: Barbara Kim, female    DOB: Dec 07, 1955  Age: 66 y.o. MRN: [id number]  Chief Complaint  Patient presents with   Cough    Patient complains of cough, Productive cough with clear sputum , x2 weeks   Sore Throat    Patient complains of sore throat, x2 weeks   Nasal Congestion    Patient complains of nasal congestion, x2 weeks    HPI   Ms. Fulginiti is seen with upper respiratory symptoms over the past 9 days.  She started with sore throat which is now resolved.  She now has mostly cough and question of some mild wheezing intermittently.  No fever.  No dyspnea at rest.  Home COVID test negative.  No facial pain.  Non-smoker.  No history of asthma.  Past Medical History:  Diagnosis Date   Chicken pox    Hypertension    Hyperthyroidism    25 years ago   Past Surgical History:  Procedure Laterality Date   APPLICATION OF CRANIAL NAVIGATION Left 02/20/2021   Procedure: APPLICATION OF CRANIAL NAVIGATION;  Surgeon: Consuella Lose, MD;  Location: [facility];  Service: Neurosurgery;  Laterality: Left;   CRANIOTOMY Left 02/20/2021   Procedure: Stereotactic LEFT pterional craniotomy for resection of meningioma;  Surgeon: Consuella Lose, MD;  Location: [facility];  Service: Neurosurgery;  Laterality: Left;   TONSILLECTOMY  1983    reports that she has never smoked. She has never used smokeless tobacco. She reports that she does not drink alcohol and does not use drugs. family history is not on file. She was adopted. Allergies  Allergen Reactions   Corn-Containing Products Dermatitis    Review of Systems  Constitutional:  Negative for chills and fever.  HENT:  Negative for sore throat.   Respiratory:  Positive for cough and wheezing. Negative for hemoptysis.   Cardiovascular:  Negative for chest pain.      Objective:     BP 124/60 (BP Location: Left Arm, Patient Position: Sitting, Cuff Size: Normal)   Pulse (!) 56   Temp  97.9 F (36.6 C) (Oral)   Ht '4\' 8"'$  (1.422 m)   Wt 104 lb 14.4 oz (47.6 kg)   SpO2 98%   BMI 23.52 kg/m    Physical Exam Vitals reviewed.  Constitutional:      Appearance: She is well-developed. She is not toxic-appearing.  HENT:     Head: Atraumatic.  Neck:     Thyroid: No thyromegaly.  Cardiovascular:     Rate and Rhythm: Normal rate and regular rhythm.  Pulmonary:     Effort: Pulmonary effort is normal.     Breath sounds: Wheezing present.     Comments: A few faint scattered expiratory wheezes.  No rales. Musculoskeletal:     Cervical back: Neck supple.  Lymphadenopathy:     Cervical: No cervical adenopathy.  Neurological:     Mental Status: She is alert.      No results found for any visits on 09/08/22.    The 10-year ASCVD risk score (Arnett DK, et al., 2019) is: 8%    Assessment & Plan:   Cough with mild reactive airway component.  Suspect probable viral URI.  Home COVID test negative.  -We discussed possible steroids given her mild wheezing on exam.  Patient prefers IM versus oral.  Depo-Medrol 80 mg IM given. -Follow-up immediately for any fever or increased shortness of breath.  Carolann Littler, MD

## 2022-09-08 NOTE — Patient Instructions (Signed)
Follow up immediately for any fever or increased shortness of breath. 

## 2022-09-08 NOTE — Addendum Note (Signed)
Addended by: Nilda Riggs on: 09/08/2022 03:06 PM   Modules accepted: Orders

## 2022-09-29 ENCOUNTER — Encounter: Payer: Medicare HMO | Attending: Psychology | Admitting: Psychology

## 2022-09-29 DIAGNOSIS — F09 Unspecified mental disorder due to known physiological condition: Secondary | ICD-10-CM | POA: Diagnosis not present

## 2022-09-29 DIAGNOSIS — R69 Illness, unspecified: Secondary | ICD-10-CM | POA: Diagnosis not present

## 2022-09-29 DIAGNOSIS — R4586 Emotional lability: Secondary | ICD-10-CM | POA: Insufficient documentation

## 2022-11-01 DIAGNOSIS — Z6824 Body mass index (BMI) 24.0-24.9, adult: Secondary | ICD-10-CM | POA: Diagnosis not present

## 2022-11-01 DIAGNOSIS — Z01419 Encounter for gynecological examination (general) (routine) without abnormal findings: Secondary | ICD-10-CM | POA: Diagnosis not present

## 2022-11-01 DIAGNOSIS — Z1231 Encounter for screening mammogram for malignant neoplasm of breast: Secondary | ICD-10-CM | POA: Diagnosis not present

## 2022-11-12 ENCOUNTER — Encounter: Payer: Medicare HMO | Attending: Psychology

## 2022-11-12 DIAGNOSIS — R69 Illness, unspecified: Secondary | ICD-10-CM | POA: Diagnosis not present

## 2022-11-12 DIAGNOSIS — F09 Unspecified mental disorder due to known physiological condition: Secondary | ICD-10-CM | POA: Insufficient documentation

## 2022-11-12 NOTE — Progress Notes (Signed)
Behavioral Observations The patient appeared well-groomed and appropriately dressed. Her manners were polite and appropriate to the situation. The patient's attitude towards testing was positive and her effort was good.  Neuropsychology Note  Kynda Daves completed 220 minutes of neuropsychological testing with technician, Wandra Scot, BA, under the supervision of Ilean Skill, PsyD., Clinical Neuropsychologist. The patient did not appear overtly distressed by the testing session, per behavioral observation or via self-report to the technician. Rest breaks were offered.   Clinical Decision Making: In considering the patient's current level of functioning, level of presumed impairment, nature of symptoms, emotional and behavioral responses during clinical interview, level of literacy, and observed level of motivation/effort, a battery of tests was selected by Dr. Sima Matas during initial consultation on 09/29/2022. This was communicated to the technician. Communication between the neuropsychologist and technician was ongoing throughout the testing session and changes were made as deemed necessary based on patient performance on testing, technician observations and additional pertinent factors such as those listed above.  Tests Administered: Controlled Oral Word Association Test (COWAT; FAS & Animals)  Finger Tapping Test (FTT) Grooved Pegboard Wechsler Adult Intelligence Scale, 4th Edition (WAIS-IV) Wechsler Memory Scale, 4th Edition (WMS-IV); Adult Battery  Results:  COWAT:  FAS total= 33 Z= -.40  Animals total= 21 Z= .72  Finger Tapping Test (FTT):  R (DH) Average= 62  Percentile Rank= 82nd  L (NDH) Average= 47.4  Percentile Rank= 64th   Grooved Pegboard:  R (DH) time= 127s  Percentile Rank= 25th  L (NDH) time= 141s  Percentile Rank= 30th   WAIS-IV:  Composite Score Summary  Scale Sum of Scaled Scores Composite Score Percentile Rank 95% Conf. Interval  Qualitative Description  Verbal Comprehension 30 VCI 100 50 94-106 Average  Perceptual Reasoning 31 PRI 102 55 96-108 Average  Working Memory 13 WMI 80 9 74-88 Low Average  Processing Speed 19 PSI 97 42 89-106 Average  Full Scale 93 FSIQ 95 37 91-99 Average  General Ability 61 GAI 101 53 96-106 Average   Verbal Comprehension Subtests Summary  Subtest Raw Score Scaled Score Percentile Rank Reference Group Scaled Score SEM  Similarities 24 10 50 10 1.04  Vocabulary 33 9 37 9 0.67  Information 17 11 63 12 0.73   Perceptual Reasoning Subtests Summary  Subtest Raw Score Scaled Score Percentile Rank Reference Group Scaled Score SEM  Block Design 36 11 63 8 1.08  Matrix Reasoning 13 10 50 7 0.90  Visual Puzzles 11 10 50 7 0.85   Working Doctor, general practice Raw Score Scaled Score Percentile Rank Reference Group Scaled Score SEM  Digit Span 20 7 16 6  0.79  Arithmetic 9 6 9 6  0.99   Processing Speed Subtests Summary  Subtest Raw Score Scaled Score Percentile Rank Reference Group Scaled Score SEM  Symbol Search 24 9 37 7 1.31  Coding 56 10 50 7 0.99   WMS-IV:   Index Score Summary  Index Sum of Scaled Scores Index Score Percentile Rank 95% Confidence Interval Qualitative Descriptor  Auditory Memory (AMI) 39 98 45 92-104 Average  Visual Memory (VMI) 38 96 39 91-102 Average  Visual Working Memory (VWMI) 16 88 21 82-96 Low Average  Immediate Memory (IMI) 36 93 32 87-100 Average  Delayed Memory (DMI) 41 102 55 95-109 Average   Primary Subtest Scaled Score Summary  Subtest Domain Raw Score Scaled Score Percentile Rank  Logical Memory I AM 23 9 37  Logical Memory II AM 21 10 50  Verbal  Paired Associates I AM 20 8 25   Verbal Paired Associates II AM 11 12 75  Designs I VM 55 9 37  Designs II VM 44 9 37  Visual Reproduction I VM 31 10 50  Visual Reproduction II VM 21 10 50  Spatial Addition VWM 8 9 37  Symbol Span VWM 11 7 16    Auditory Memory Process Score Summary   Process Score Raw Score Scaled Score Percentile Rank Cumulative Percentage (Base Rate)  LM II Recognition 18 - - 3-9%  VPA II Recognition 40 - - >75%  VPA II Word Recall 22 15 95 -   Visual Memory Process Score Summary  Process Score Raw Score Scaled Score Percentile Rank Cumulative Percentage (Base Rate)  DE I Content 41 15 95 -  DE I Spatial 14 9 37 -  DE II Content 34 12 75 -  DE II Spatial 10 9 37 -  DE II Recognition 14 - - 51-75%  VR II Recognition 5 - - 26-50%  VR II Copy 37 - - 3-9%   ABILITY-MEMORY ANALYSIS  Ability Score:  VCI: 100 Date of Testing:  WAIS-IV; WMS-IV 2022/11/12  Predicted Difference Method   Index Predicted WMS-IV Index Score Actual WMS-IV Index Score Difference Critical Value  Significant Difference Y/N Base Rate  Auditory Memory 100 98 2 10.15 N   Visual Memory 100 96 4 9.30 N   Visual Working Memory 100 88 12 11.68 Y 15-20%  Immediate Memory 100 93 7 11.15 N   Delayed Memory 100 102 -2 11.49 N     Feedback to Patient: Ezariah Weltman will return on 05/17/2023 or earlier for an interactive feedback session with Dr. Sima Matas at which time her test performances, clinical impressions and treatment recommendations will be reviewed in detail. The patient understands she can contact our office should she require our assistance before this time.  220 minutes spent face-to-face with patient administering standardized tests, 30 minutes spent scoring Environmental education officer). [CPT [id number], [id number]  Full report to follow.

## 2022-12-20 NOTE — Progress Notes (Signed)
Neuropsychological Consultation   Patient:   Barbara Kim   DOB:   06-26-1956  MR Number:  [id number]  Location:  [facility] [place], [place] [id number] [place] [address] Dept: [phone]           Date of Service:   09/29/2022  Location of Service and Individuals present: Today's visit was conducted in my outpatient clinic office with the patient her friend Barbara Kim and myself present.  Start Time:   8 AM End Time:   9 AM  Patient Consent and Confidentiality: The patient was informed as to the limits of confidentiality regarding her evaluation and care being made available in the patient's EMR for her referring physician and in general EMR records.  Patient consented to receive care and to understanding the limits of confidentiality.  Consent for Evaluation and Treatment:  Signed:  Yes Explanation of Privacy Policies:  Signed:  Yes Discussion of Confidentiality Limits:  Yes  Provider/Observer:  Barbara Kim, Psy.D.       Clinical Neuropsychologist       Billing Code/Service: [phone]  Chief Complaint:    No chief complaint on file.   Reason for Service:    Barbara Kim is a 67 year old female referred for neuropsychological consultation and evaluation by her treating neurosurgeon Barbara Lose, MD due to difficulties with memory and cognitive efficiency.  The patient has a past medical history that includes hypertension and a history of meningioma of the brain in 2022.  Patient has had recent MRI brain that showed no residual tumor or recurrence but family have noted concerns about changes in personality and cognitive changes.  The patient reports that she in general is a very busy person and gets easily distracted when doing things and has been noting more difficulty with forgetting objects such as her keys or phone.  Patient reports that she has had a  lifelong issue with forgetfulness remembers long-term events.  The patient reports that her memory are more episodic difficulties than semantic memory deficits.  Patient notes that the referral for neuropsychological assessment was requested by her daughter/family.  The patient notes only occasional or infrequent difficulties with remembering or maintaining affective cognitive functioning.  The patient reports some word finding difficulties and that cueing helps with recall.  Patient reports that after her surgery for her meningioma in May 2022 that she has noted some changes in personality and has become more outgoing and impulsive and that she is essentially more extroverted than before.  The patient reports that she is experienced some tremors with her left hand, which have been noted by her nurse as well as friends and noted to have been present in April 2022.  Patient notes that she has been working on trying to improve her memory functions.  The patient's friend Barbara Kim notes that the friend is noted a slight tremor and changes in memory functions.  There is an extended friends group that talk about the patient having more episodic memory difficulties.  These are noted to have developed after her surgery and have improved some with rehabilitation efforts.  The patient is very physically active with running and biking activities and displays a good joy for life as noted by her friend.  Patient is noted to have had physical, occupational and speech therapy postsurgery.  Medical History:   Past Medical History:  Diagnosis Date   Chicken pox    Hypertension  Hyperthyroidism    25 years ago         Patient Active Problem List   Diagnosis Date Noted   H/O meningioma of the brain 04/21/2021   Brain tumor ([place]) 02/20/2021   Essential hypertension, benign 03/25/2014    Onset and Duration of Symptoms: Patient developed changes post surgical intervention for meningioma in May 2022.  Patient has  participated in OT/PT/speech therapies after surgery with significant improvement but continued memory, personality change and mild tremor.  Progression of Symptoms: Symptoms have improved from initial difficulties postsurgery but continues with memory and other symptoms.  Triggering Factors: Status post meningioma surgery with left frontal craniotomy and resection of left sphenoid wing meningioma  Associated Symptoms (e.g., cognitive, emotional, behavioral): Changes in memory, personality style with more impulsiveness and extraversion type behavioral styles.  Additional Tests and Measures from other records:  Neuroimaging Results: pneumocephalus, mass effect, and midline shift, and decreased left extra-axial fluid collection.  Current Typical Mood State:  Positive and Euthymic  Sleep: Patient reports that she goes to bed at 8 PM at night and wakes up at 4 AM in the morning and generally feels well rested.  Patient reports occasional snoring if she sleeps flat on her back.  Diet Pattern: Patient describes a good appetite and reports that she eats a healthy diet.  Behavioral Observation/Mental Status:   Barbara Kim  presents as a 67 y.o.-year-old Right handed Asian Female who appeared her stated age. her dress was Appropriate and she was Well Groomed and her manners were Appropriate to the situation.  her participation was indicative of Appropriate and Redirectable behaviors.  There were not physical disabilities noted.  she displayed an appropriate level of cooperation and motivation.    Interactions:    Active Appropriate  Attention:   abnormal and attention span appeared shorter than expected for age  Memory:   abnormal; remote memory intact, recent memory impaired  Visuo-spatial:   not examined  Speech (Volume):  normal  Speech:   normal; some word finding issues noted  Thought Process:  Coherent and Relevant  Coherent, Directed, and Logical  Though Content:  WNL; not  suicidal and not homicidal  Orientation:   person, place, time/date, and situation  Judgment:   Good  Planning:   Good  Affect:    Excited  Mood:    Euthymic  Insight:   Good  Intelligence:   high  Marital Status/Living:  Patient was born and raised in [place].  Patient was adopted and does not know if any issues with family members.  Patient continues to live with her husband of 78 years and has 3 adult children all of whom are in good medical and psychological health.  Educational and Occupational History:     Highest Level of Education:   Patient graduated from college at [employer] and always did well in [place] and science classes and had some relative difficulties with math.  Educational and Career Achievements: Patient had further training and massage and body work therapy done in the [place].  Current Occupation:    Patient is self-employed Financial trader.  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  Hobbies and Interests: Patient reports that she loves outdoor activity and water activities including snorkeling, paddle boarding and kayaking.  The patient runs regularly and does light weightlifting.  Psychiatric History:  No  prior psychiatric history  Family Med/Psych History:  Family History  Adopted: Yes    Impression/DX:   Barbara Kim is a 67 year old female referred for neuropsychological consultation and evaluation by her treating neurosurgeon Barbara Lose, MD due to difficulties with memory and cognitive efficiency.  The patient has a past medical history that includes hypertension and a history of meningioma of the brain in 2022.  Patient has had recent MRI brain that showed no residual tumor or recurrence but family have noted concerns about changes in personality and cognitive changes.  The patient reports  that she in general is a very busy person and gets easily distracted when doing things and has been noting more difficulty with forgetting objects such as her keys or phone.  Patient reports that she has had a lifelong issue with forgetfulness remembers long-term events.  The patient reports that her memory are more episodic difficulties than semantic memory deficits.  Patient notes that the referral for neuropsychological assessment was requested by her daughter/family.  Disposition/Plan:  We have set the patient up for formal neuropsychological assessment and she will be administered a foundational battery of the Wechsler Adult Intelligence Scale's, Timoteo Expose memory scales, the control or word association test, finger tapping test and grooved pegboard test.  Once these are completed a determination will be made as to any potential need for further psychometric testing.  After the assessment is completed a formal report will be made with recommendations for her health providers as well as providing feedback to the patient with specific recommendations for her.  Diagnosis:    Cognitive and neurobehavioral dysfunction        Note: This document was prepared using Dragon voice recognition software and may include unintentional dictation errors.   Electronically Signed   _______________________ Barbara Kim, Psy.D. Clinical Neuropsychologist

## 2023-01-10 ENCOUNTER — Telehealth: Payer: Self-pay | Admitting: Family Medicine

## 2023-01-10 NOTE — Telephone Encounter (Signed)
Contacted [facility] to schedule their annual wellness visit. Appointment made for 01/26/23.  [name] AWV direct phone # [phone]    Due to schedule change  need to r/s 4/18 appt to 01/26/23 Pt aware

## 2023-01-10 NOTE — Telephone Encounter (Signed)
Called patient to schedule Medicare Annual Wellness Visit (AWV). Left message for patient to call back and schedule Medicare Annual Wellness Visit (AWV).  Last date of AWV: 01/11/22  Lm asking pt to call  need to r/s 4/18 AWV appt  If any questions, please contact me at [phone].  Thank you ,  [name] AWV direct phone # [phone]

## 2023-01-10 NOTE — Telephone Encounter (Signed)
Contacted Barbara Kim to schedule their annual wellness visit. Call back at later date: 01/10/23 @ 2  [name] AWV direct phone # [phone]   Spoke with patient to r/s her 4/15 appt   patient wanted a call back 4/15 @ 2

## 2023-01-26 ENCOUNTER — Ambulatory Visit (INDEPENDENT_AMBULATORY_CARE_PROVIDER_SITE_OTHER): Payer: Medicare HMO

## 2023-01-26 VITALS — Ht <= 58 in | Wt 107.0 lb

## 2023-01-26 DIAGNOSIS — Z0000 Encounter for general adult medical examination without abnormal findings: Secondary | ICD-10-CM

## 2023-01-26 NOTE — Progress Notes (Signed)
I connected with  Barbara Kim on 01/26/23 by a audio enabled telemedicine application and verified that I am speaking with the correct person using two identifiers.  Patient Location: Home  Provider Location: Office/Clinic  I discussed the limitations of evaluation and management by telemedicine. The patient expressed understanding and agreed to proceed.  Subjective:   Barbara Kim is a 67 y.o. female who presents for Medicare Annual (Subsequent) preventive examination.  Review of Systems     Cardiac Risk Factors include: advanced age (>67men, >34 women);hypertension     Objective:    Today's Vitals   01/26/23 0813  Weight: 107 lb (48.5 kg)  Height: 4\' 9"  (1.448 m)   Body mass index is 23.15 kg/m.     01/26/2023    8:18 AM 01/11/2022   10:16 AM 03/12/2021    2:03 PM 03/11/2021    5:13 PM 02/17/2021    8:20 AM  Advanced Directives  Does Patient Have a Medical Advance Directive? No No No No No  Would patient like information on creating a medical advance directive?  No - Patient declined No - Patient declined No - Patient declined     Current Medications (verified) Outpatient Encounter Medications as of 01/26/2023  Medication Sig   alendronate (FOSAMAX) 70 MG tablet TAKE 1 TABLET BY MOUTH EVERY WEEK   amLODipine (NORVASC) 5 MG tablet Take 1 tablet (5 mg total) by mouth daily.   Vitamin D, Ergocalciferol, (DRISDOL) 50000 units CAPS capsule TAKE 1 CAPSULE (50,000 UNITS TOTAL) BY MOUTH EVERY 7 (SEVEN) DAYS.   triamcinolone cream (KENALOG) 0.1 % Apply 1 application. topically 2 (two) times daily as needed. (Patient not taking: Reported on 01/26/2023)   No facility-administered encounter medications on file as of 01/26/2023.    Allergies (verified) Corn-containing products   History: Past Medical History:  Diagnosis Date   Chicken pox    Hypertension    Hyperthyroidism    25 years ago   Past Surgical History:  Procedure Laterality Date   APPLICATION OF CRANIAL  NAVIGATION Left 02/20/2021   Procedure: APPLICATION OF CRANIAL NAVIGATION;  Surgeon: Lisbeth Renshaw, MD;  Location: [facility] OR;  Service: Neurosurgery;  Laterality: Left;   CRANIOTOMY Left 02/20/2021   Procedure: Stereotactic LEFT pterional craniotomy for resection of meningioma;  Surgeon: Lisbeth Renshaw, MD;  Location: [facility] OR;  Service: Neurosurgery;  Laterality: Left;   TONSILLECTOMY  1983   Family History  Adopted: Yes   Social History   Socioeconomic History   Marital status: Unknown    Spouse name: Not on file   Number of children: Not on file   Years of education: Not on file   Highest education level: Not on file  Occupational History   Not on file  Tobacco Use   Smoking status: Never   Smokeless tobacco: Never  Vaping Use   Vaping Use: Never used  Substance and Sexual Activity   Alcohol use: No   Drug use: No   Sexual activity: Not on file  Other Topics Concern   Not on file  Social History Narrative   Not on file   Social Determinants of Health   Financial Resource Strain: Low Risk  (01/26/2023)   Overall Financial Resource Strain (CARDIA)    Difficulty of Paying Living Expenses: Not hard at all  Food Insecurity: No Food Insecurity (01/26/2023)   Hunger Vital Sign    Worried About Running Out of Food in the Last Year: Never true    Ran Out of Food in  the Last Year: Never true  Transportation Needs: No Transportation Needs (01/26/2023)   PRAPARE - Administrator, Civil Service (Medical): No    Lack of Transportation (Non-Medical): No  Physical Activity: Sufficiently Active (01/26/2023)   Exercise Vital Sign    Days of Exercise per Week: 5 days    Minutes of Exercise per Session: 120 min  Stress: No Stress Concern Present (01/26/2023)   [employer] of Occupational Health - Occupational Stress Questionnaire    Feeling of Stress : Not at all  Social Connections: Socially Integrated (01/11/2022)   Social Connection and Isolation Panel [NHANES]     Frequency of Communication with Friends and Family: More than three times a week    Frequency of Social Gatherings with Friends and Family: More than three times a week    Attends Religious Services: More than 4 times per year    Active Member of [employer] or Organizations: Yes    Attends Engineer, structural: More than 4 times per year    Marital Status: Married    Tobacco Counseling Counseling given: Not Answered   Clinical Intake:  Pre-visit preparation completed: Yes  Pain : No/denies pain     Nutritional Status: BMI of 19-24  Normal Nutritional Risks: None Diabetes: No  How often do you need to have someone help you when you read instructions, pamphlets, or other written materials from your doctor or pharmacy?: 1 - Never  Diabetic? no  Interpreter Needed?: No  Information entered by :: NAllen LPN   Activities of Daily Living    01/26/2023    8:20 AM  In your present state of health, do you have any difficulty performing the following activities:  Hearing? 0  Vision? 0  Difficulty concentrating or making decisions? 0  Walking or climbing stairs? 0  Dressing or bathing? 0  Doing errands, shopping? 0  Preparing Food and eating ? N  Using the Toilet? N  In the past six months, have you accidently leaked urine? N  Do you have problems with loss of bowel control? N  Managing your Medications? N  Managing your Finances? N  Housekeeping or managing your Housekeeping? N    Patient Care Team: Kristian Covey, MD as PCP - General (Family Medicine) Richarda Overlie, MD as Consulting Physician (Obstetrics and Gynecology)  Indicate any recent Medical Services you may have received from other than [facility] providers in the past year (date may be approximate).     Assessment:   This is a routine wellness examination for [name].  Hearing/Vision screen Vision Screening - Comments:: No regular eye exams  Dietary issues and exercise activities  discussed: Current Exercise Habits: Structured exercise class, Type of exercise: yoga;strength training/weights;stretching (running), Time (Minutes): > 60, Frequency (Times/Week): 5, Weekly Exercise (Minutes/Week): 0   Goals Addressed             This Visit's Progress    Patient Stated       01/26/2023, wants to strengthen core       Depression Screen    01/26/2023    8:20 AM 09/08/2022    2:17 PM 01/11/2022   10:12 AM 12/21/2021    9:51 AM 06/22/2019    7:47 AM  PHQ 2/9 Scores  PHQ - 2 Score 0 0 0 0 0  PHQ- 9 Score     0    Fall Risk    01/26/2023    8:19 AM 09/08/2022    2:16 PM 01/11/2022  10:15 AM 12/21/2021    9:52 AM  Fall Risk   Falls in the past year? 0 0 0 0  Number falls in past yr: 0 0 0 0  Injury with Fall? 0 0 0 0  Risk for fall due to : Medication side effect No Fall Risks No Fall Risks No Fall Risks  Follow up Falls prevention discussed;Education provided;Falls evaluation completed Falls evaluation completed  Falls evaluation completed    FALL RISK PREVENTION PERTAINING TO THE HOME:  Any stairs in or around the home? Yes  If so, are there any without handrails? No  Home free of loose throw rugs in walkways, pet beds, electrical cords, etc? Yes  Adequate lighting in your home to reduce risk of falls? Yes   ASSISTIVE DEVICES UTILIZED TO PREVENT FALLS:  Life alert? No  Use of a cane, walker or w/c? No  Grab bars in the bathroom? No  Shower chair or bench in shower? Yes  Elevated toilet seat or a handicapped toilet? No   TIMED UP AND GO:  Was the test performed? No .       Cognitive Function:        01/26/2023    8:22 AM 01/11/2022   10:16 AM  6CIT Screen  What Year? 0 points 0 points  What month? 0 points 0 points  What time? 0 points 0 points  Count back from 20 0 points 0 points  Months in reverse 0 points 0 points  Repeat phrase 2 points 0 points  Total Score 2 points 0 points    Immunizations Immunization History  Administered  Date(s) Administered   Influenza,inj,Quad PF,6+ Mos 06/22/2019   Moderna Sars-Covid-2 Vaccination 11/23/2019, 12/25/2019   PNEUMOCOCCAL CONJUGATE-20 04/21/2021   Tdap 03/25/2014    TDAP status: Up to date  Flu Vaccine status: Up to date  Pneumococcal vaccine status: Up to date  Covid-19 vaccine status: Completed vaccines  Qualifies for Shingles Vaccine? Yes   Zostavax completed No   Shingrix Completed?: No.    Education has been provided regarding the importance of this vaccine. Patient has been advised to call insurance company to determine out of pocket expense if they have not yet received this vaccine. Advised may also receive vaccine at local pharmacy or Health Dept. Verbalized acceptance and understanding.  Screening Tests Health Maintenance  Topic Date Due   Zoster Vaccines- Shingrix (1 of 2) Never done   DEXA SCAN  Never done   COVID-19 Vaccine (3 - 2023-24 season) 05/28/2022   MAMMOGRAM  08/08/2022   Medicare Annual Wellness (AWV)  01/12/2023   INFLUENZA VACCINE  04/28/2023   DTaP/Tdap/Td (2 - Td or Tdap) 03/25/2024   COLONOSCOPY (Pts 45-72yrs Insurance coverage will need to be confirmed)  07/12/2029   Pneumonia Vaccine 29+ Years old  Completed   Hepatitis C Screening  Completed   HPV VACCINES  Aged Out    Health Maintenance  Health Maintenance Due  Topic Date Due   Zoster Vaccines- Shingrix (1 of 2) Never done   DEXA SCAN  Never done   COVID-19 Vaccine (3 - 2023-24 season) 05/28/2022   MAMMOGRAM  08/08/2022   Medicare Annual Wellness (AWV)  01/12/2023    Colorectal cancer screening: Type of screening: Colonoscopy. Completed 07/12/2022. Repeat every 7 years  Mammogram status: Completed 2023. Repeat every year  Bone Density status: Ordered 04/19/2022. Pt provided with contact info and advised to call to schedule appt.  Lung Cancer Screening: (Low Dose CT Chest recommended if  Age 83-80 years, 30 pack-year currently smoking OR have quit w/in 15years.) does not  qualify.   Lung Cancer Screening Referral: no  Additional Screening:  Hepatitis C Screening: does qualify; Completed 06/22/2019  Vision Screening: Recommended annual ophthalmology exams for early detection of glaucoma and other disorders of the eye. Is the patient up to date with their annual eye exam?  No  Who is the provider or what is the name of the office in which the patient attends annual eye exams? none If pt is not established with a provider, would they like to be referred to a provider to establish care? No .   Dental Screening: Recommended annual dental exams for proper oral hygiene  Community Resource Referral / Chronic Care Management: CRR required this visit?  No   CCM required this visit?  No      Plan:     I have personally reviewed and noted the following in the patient's chart:   Medical and social history Use of alcohol, tobacco or illicit drugs  Current medications and supplements including opioid prescriptions. Patient is not currently taking opioid prescriptions. Functional ability and status Nutritional status Physical activity Advanced directives List of other physicians Hospitalizations, surgeries, and ER visits in previous 12 months Vitals Screenings to include cognitive, depression, and falls Referrals and appointments  In addition, I have reviewed and discussed with patient certain preventive protocols, quality metrics, and best practice recommendations. A written personalized care plan for preventive services as well as general preventive health recommendations were provided to patient.     Barb Merino, LPN   10/02/1094   Nurse Notes: none  Due to this being a virtual visit, the after visit summary with patients personalized plan was offered to patient via mail or my-chart.  to pick up at office at next visit

## 2023-01-26 NOTE — Patient Instructions (Signed)
Barbara Kim , Thank you for taking time to come for your Medicare Wellness Visit. I appreciate your ongoing commitment to your health goals. Please review the following plan we discussed and let me know if I can assist you in the future.   These are the goals we discussed:  Goals       Increase physical activity (pt-stated)      Continue to walk and run everyday.      Patient Stated      01/26/2023, wants to strengthen core        This is a list of the screening recommended for you and due dates:  Health Maintenance  Topic Date Due   Zoster (Shingles) Vaccine (1 of 2) Never done   DEXA scan (bone density measurement)  Never done   COVID-19 Vaccine (3 - 2023-24 season) 05/28/2022   Mammogram  08/08/2022   Flu Shot  04/28/2023   Medicare Annual Wellness Visit  01/26/2024   DTaP/Tdap/Td vaccine (2 - Td or Tdap) 03/25/2024   Colon Cancer Screening  07/12/2029   Pneumonia Vaccine  Completed   Hepatitis C Screening: USPSTF Recommendation to screen - Ages 18-79 yo.  Completed   HPV Vaccine  Aged Out    Advanced directives: Advance directive discussed with you today.   Conditions/risks identified: none  Next appointment: Follow up in one year for your annual wellness visit    Preventive Care 65 Years and Older, Female Preventive care refers to lifestyle choices and visits with your health care provider that can promote health and wellness. What does preventive care include? A yearly physical exam. This is also called an annual well check. Dental exams once or twice a year. Routine eye exams. Ask your health care provider how often you should have your eyes checked. Personal lifestyle choices, including: Daily care of your teeth and gums. Regular physical activity. Eating a healthy diet. Avoiding tobacco and drug use. Limiting alcohol use. Practicing safe sex. Taking low-dose aspirin every day. Taking vitamin and mineral supplements as recommended by your health care  provider. What happens during an annual well check? The services and screenings done by your health care provider during your annual well check will depend on your age, overall health, lifestyle risk factors, and family history of disease. Counseling  Your health care provider may ask you questions about your: Alcohol use. Tobacco use. Drug use. Emotional well-being. Home and relationship well-being. Sexual activity. Eating habits. History of falls. Memory and ability to understand (cognition). Work and work Astronomer. Reproductive health. Screening  You may have the following tests or measurements: Height, weight, and BMI. Blood pressure. Lipid and cholesterol levels. These may be checked every 5 years, or more frequently if you are over 76 years old. Skin check. Lung cancer screening. You may have this screening every year starting at age 44 if you have a 30-pack-year history of smoking and currently smoke or have quit within the past 15 years. Fecal occult blood test (FOBT) of the stool. You may have this test every year starting at age 7. Flexible sigmoidoscopy or colonoscopy. You may have a sigmoidoscopy every 5 years or a colonoscopy every 10 years starting at age 38. Hepatitis C blood test. Hepatitis B blood test. Sexually transmitted disease (STD) testing. Diabetes screening. This is done by checking your blood sugar (glucose) after you have not eaten for a while (fasting). You may have this done every 1-3 years. Bone density scan. This is done to screen for osteoporosis.  You may have this done starting at age 5. Mammogram. This may be done every 1-2 years. Talk to your health care provider about how often you should have regular mammograms. Talk with your health care provider about your test results, treatment options, and if necessary, the need for more tests. Vaccines  Your health care provider may recommend certain vaccines, such as: Influenza vaccine. This is  recommended every year. Tetanus, diphtheria, and acellular pertussis (Tdap, Td) vaccine. You may need a Td booster every 10 years. Zoster vaccine. You may need this after age 59. Pneumococcal 13-valent conjugate (PCV13) vaccine. One dose is recommended after age 58. Pneumococcal polysaccharide (PPSV23) vaccine. One dose is recommended after age 71. Talk to your health care provider about which screenings and vaccines you need and how often you need them. This information is not intended to replace advice given to you by your health care provider. Make sure you discuss any questions you have with your health care provider. Document Released: 10/10/2015 Document Revised: 06/02/2016 Document Reviewed: 07/15/2015 Elsevier Interactive Patient Education  2017 [place] Prevention in the Home Falls can cause injuries. They can happen to people of all ages. There are many things you can do to make your home safe and to help prevent falls. What can I do on the outside of my home? Regularly fix the edges of walkways and driveways and fix any cracks. Remove anything that might make you trip as you walk through a door, such as a raised step or threshold. Trim any bushes or trees on the path to your home. Use bright outdoor lighting. Clear any walking paths of anything that might make someone trip, such as rocks or tools. Regularly check to see if handrails are loose or broken. Make sure that both sides of any steps have handrails. Any raised decks and porches should have guardrails on the edges. Have any leaves, snow, or ice cleared regularly. Use sand or salt on walking paths during winter. Clean up any spills in your garage right away. This includes oil or grease spills. What can I do in the bathroom? Use night lights. Install grab bars by the toilet and in the tub and shower. Do not use towel bars as grab bars. Use non-skid mats or decals in the tub or shower. If you need to sit down in  the shower, use a plastic, non-slip stool. Keep the floor dry. Clean up any water that spills on the floor as soon as it happens. Remove soap buildup in the tub or shower regularly. Attach bath mats securely with double-sided non-slip rug tape. Do not have throw rugs and other things on the floor that can make you trip. What can I do in the bedroom? Use night lights. Make sure that you have a light by your bed that is easy to reach. Do not use any sheets or blankets that are too big for your bed. They should not hang down onto the floor. Have a firm chair that has side arms. You can use this for support while you get dressed. Do not have throw rugs and other things on the floor that can make you trip. What can I do in the kitchen? Clean up any spills right away. Avoid walking on wet floors. Keep items that you use a lot in easy-to-reach places. If you need to reach something above you, use a strong step stool that has a grab bar. Keep electrical cords out of the way. Do not use  floor polish or wax that makes floors slippery. If you must use wax, use non-skid floor wax. Do not have throw rugs and other things on the floor that can make you trip. What can I do with my stairs? Do not leave any items on the stairs. Make sure that there are handrails on both sides of the stairs and use them. Fix handrails that are broken or loose. Make sure that handrails are as long as the stairways. Check any carpeting to make sure that it is firmly attached to the stairs. Fix any carpet that is loose or worn. Avoid having throw rugs at the top or bottom of the stairs. If you do have throw rugs, attach them to the floor with carpet tape. Make sure that you have a light switch at the top of the stairs and the bottom of the stairs. If you do not have them, ask someone to add them for you. What else can I do to help prevent falls? Wear shoes that: Do not have high heels. Have rubber bottoms. Are comfortable  and fit you well. Are closed at the toe. Do not wear sandals. If you use a stepladder: Make sure that it is fully opened. Do not climb a closed stepladder. Make sure that both sides of the stepladder are locked into place. Ask someone to hold it for you, if possible. Clearly mark and make sure that you can see: Any grab bars or handrails. First and last steps. Where the edge of each step is. Use tools that help you move around (mobility aids) if they are needed. These include: Canes. Walkers. Scooters. Crutches. Turn on the lights when you go into a dark area. Replace any light bulbs as soon as they burn out. Set up your furniture so you have a clear path. Avoid moving your furniture around. If any of your floors are uneven, fix them. If there are any pets around you, be aware of where they are. Review your medicines with your doctor. Some medicines can make you feel dizzy. This can increase your chance of falling. Ask your doctor what other things that you can do to help prevent falls. This information is not intended to replace advice given to you by your health care provider. Make sure you discuss any questions you have with your health care provider. Document Released: 07/10/2009 Document Revised: 02/19/2016 Document Reviewed: 10/18/2014 Elsevier Interactive Patient Education  2017 [employer].

## 2023-01-31 ENCOUNTER — Other Ambulatory Visit: Payer: Self-pay | Admitting: Neurosurgery

## 2023-01-31 DIAGNOSIS — D329 Benign neoplasm of meninges, unspecified: Secondary | ICD-10-CM

## 2023-02-03 ENCOUNTER — Encounter: Payer: Self-pay | Admitting: Neurosurgery

## 2023-02-08 ENCOUNTER — Ambulatory Visit: Payer: Medicare HMO | Admitting: Sports Medicine

## 2023-02-10 ENCOUNTER — Ambulatory Visit: Payer: Medicare HMO | Admitting: Sports Medicine

## 2023-02-10 VITALS — BP 160/82 | Ht <= 58 in | Wt 105.0 lb

## 2023-02-10 DIAGNOSIS — M2012 Hallux valgus (acquired), left foot: Secondary | ICD-10-CM | POA: Diagnosis not present

## 2023-02-10 DIAGNOSIS — M2010 Hallux valgus (acquired), unspecified foot: Secondary | ICD-10-CM | POA: Insufficient documentation

## 2023-02-10 DIAGNOSIS — M21619 Bunion of unspecified foot: Secondary | ICD-10-CM

## 2023-02-10 NOTE — Assessment & Plan Note (Signed)
We are doing a trial with arch support and sports insoles Good use custom orthotics but would require shoe change as well  Trial with this 1 month and reconsider options.

## 2023-02-10 NOTE — Progress Notes (Signed)
  Barbara Kim - 67 y.o. female MRN [id number]  Date of birth: 12-30-1955    CHIEF COMPLAINT:   L bunion    SUBJECTIVE:   HPI:  Pleasant 67 year old female comes to clinic to be evaluated for left foot pain.  She has a history of bilateral bunions.  She had bunion surgery on the right foot about 30 years ago.  She notes very painful bunion on her left foot.  She is an avid runner.  The bunion hurts her when she runs.  She runs in both [employer] and under [employer]our shoes.  She is tried wide toe boxes but this does not seem to help.  ROS:     See HPI  PERTINENT  PMH / PSH FH / / SH:  Past Medical, Surgical, Social, and Family History Reviewed & Updated in the EMR.  Pertinent findings include:  none  OBJECTIVE: BP (!) 160/82   Ht 4\' 9"  (1.448 m)   Wt 105 lb (47.6 kg)   BMI 22.72 kg/m   Physical Exam:  Vital signs are reviewed.  BP repeated.  GEN: Alert and oriented, NAD Pulm: Breathing unlabored PSY: normal mood, congruent affect  MSK: Feet - bilateral collapse of the longitudinal arch.  Bilateral collapse of the transverse arch.  Bilateral hallux valgus, left greater than right.  The left hallux valgus has some erythema and inflammation.  Widened forefoot and in pronation on the left.  Supination of the fourth and fifth toes on the left.   Running gait even with the sports insoles shows she tends toward forefoot pronation and cross over stride pattern.  ASSESSMENT & PLAN:  1.  Left hallux valgus  -Put the patient in green sport insoles today with bilateral scaphoid pads and metatarsal pads in an effort to redistribute some of the pressure off the first ray.  Also gave her a bunion pad to help alleviate some of the pressure off of the bunion itself.  Encouraged her to use Voltaren gel to help relieve some of the inflammation at the bunion.  Also gave her the option of cutting a hole in her shoe at the bunion to relieve pressure as well.  She can follow-up as needed if she wants custom  orthotics.  WE suggested trying altra wide toe box shoes  Arvella Nigh, MD PGY-4, Sports Medicine Fellow [facility]  I observed and examined the patient with the resident and agree with assessment and plan.  Note reviewed and modified by me. Sterling Big, MD

## 2023-03-10 ENCOUNTER — Ambulatory Visit
Admission: RE | Admit: 2023-03-10 | Discharge: 2023-03-10 | Disposition: A | Discharge status: Home / Self Care | Payer: Medicare HMO | Source: Ambulatory Visit | Attending: Neurosurgery | Admitting: Neurosurgery

## 2023-03-10 DIAGNOSIS — Z09 Encounter for follow-up examination after completed treatment for conditions other than malignant neoplasm: Secondary | ICD-10-CM | POA: Diagnosis not present

## 2023-03-10 DIAGNOSIS — D329 Benign neoplasm of meninges, unspecified: Secondary | ICD-10-CM | POA: Diagnosis not present

## 2023-03-10 MED ORDER — GADOPICLENOL 0.5 MMOL/ML IV SOLN
5.0000 mL | Freq: Once | INTRAVENOUS | Status: AC | PRN
  Administered 2023-03-10: 5 mL via INTRAVENOUS

## 2023-03-28 DIAGNOSIS — D329 Benign neoplasm of meninges, unspecified: Secondary | ICD-10-CM | POA: Diagnosis not present

## 2023-04-18 ENCOUNTER — Other Ambulatory Visit: Payer: Self-pay | Admitting: Family Medicine

## 2023-04-18 ENCOUNTER — Telehealth: Payer: Self-pay | Admitting: Family Medicine

## 2023-04-18 MED ORDER — AMLODIPINE BESYLATE 5 MG PO TABS: 5.0000 mg | ORAL_TABLET | Freq: Every day | ORAL | 0 refills | Status: DC

## 2023-04-18 NOTE — Telephone Encounter (Signed)
Rx sent 

## 2023-04-18 NOTE — Telephone Encounter (Signed)
Pt call and stated she can't find her amLODipine (NORVASC) 5 MG tablet and want refill call in .

## 2023-04-26 ENCOUNTER — Ambulatory Visit (INDEPENDENT_AMBULATORY_CARE_PROVIDER_SITE_OTHER): Payer: Medicare HMO | Admitting: Family Medicine

## 2023-04-26 ENCOUNTER — Encounter: Payer: Self-pay | Admitting: Family Medicine

## 2023-04-26 VITALS — BP 150/60 | HR 65 | Temp 97.7°F | Ht <= 58 in | Wt 115.6 lb

## 2023-04-26 DIAGNOSIS — M810 Age-related osteoporosis without current pathological fracture: Secondary | ICD-10-CM | POA: Diagnosis not present

## 2023-04-26 DIAGNOSIS — Z0000 Encounter for general adult medical examination without abnormal findings: Secondary | ICD-10-CM | POA: Diagnosis not present

## 2023-04-26 NOTE — Patient Instructions (Signed)
Get back on DAILY use of Amlodipine  Set up one month follow up.

## 2023-04-26 NOTE — Progress Notes (Signed)
Established Patient Office Visit  Subjective   Patient ID: Barbara Kim, female    DOB: Jun 29, 1956  Age: 67 y.o. MRN: [id number]  Chief Complaint  Patient presents with   Annual Exam    HPI   Barbara Kim is seen today for physical exam.  She has history of hypertension treated with amlodipine.  However, she has been skipping several doses within the past month.  She has history of meningioma and had surgical excision few years ago.  She had recent follow-up with neurosurgeon and no evidence for recurrence.  She also has been diagnosed with osteoporosis per GYN and now takes Fosamax  Health maintenance reviewed:  -She states she had DEXA scan and mammogram last October -Colonoscopy due 2030 -Pneumonia vaccines complete -Tetanus up-to-date for 1 more year -No history of Shingrix  Family history-unknown as she is adopted  Social history-she is married.  She has 3 grown children 1 son and 2 daughters.  They all live on the [facility].  She has no history of smoking.  No alcohol.  Works as a Teacher, adult education.  Exercises regularly.  Past Medical History:  Diagnosis Date   Chicken pox    Hypertension    Hyperthyroidism    25 years ago   Past Surgical History:  Procedure Laterality Date   APPLICATION OF CRANIAL NAVIGATION Left 02/20/2021   Procedure: APPLICATION OF CRANIAL NAVIGATION;  Surgeon: Lisbeth Renshaw, MD;  Location: [facility] OR;  Service: Neurosurgery;  Laterality: Left;   CRANIOTOMY Left 02/20/2021   Procedure: Stereotactic LEFT pterional craniotomy for resection of meningioma;  Surgeon: Lisbeth Renshaw, MD;  Location: [facility] OR;  Service: Neurosurgery;  Laterality: Left;   TONSILLECTOMY  1983    reports that she has never smoked. She has never used smokeless tobacco. She reports that she does not drink alcohol and does not use drugs. family history is not on file. She was adopted. Allergies  Allergen Reactions   Corn-Containing Products Dermatitis    Review of  Systems  Constitutional:  Negative for chills, fever, malaise/fatigue and weight loss.  HENT:  Negative for hearing loss.   Eyes:  Negative for blurred vision and double vision.  Respiratory:  Negative for cough and shortness of breath.   Cardiovascular:  Negative for chest pain, palpitations and leg swelling.  Gastrointestinal:  Negative for abdominal pain, blood in stool, constipation and diarrhea.  Genitourinary:  Negative for dysuria.  Skin:  Negative for rash.  Neurological:  Negative for dizziness, speech change, seizures, loss of consciousness and headaches.  Psychiatric/Behavioral:  Negative for depression.       Objective:     BP (!) 150/60 (BP Location: Left Arm, Patient Position: Sitting, Cuff Size: Normal)   Pulse 65   Temp 97.7 F (36.5 C) (Oral)   Ht 4' 8.69" (1.44 m)   Wt 115 lb 9.6 oz (52.4 kg)   SpO2 98%   BMI 25.29 kg/m  BP Readings from Last 3 Encounters:  04/26/23 (!) 150/60  02/10/23 (!) 160/82  09/08/22 124/60   Wt Readings from Last 3 Encounters:  04/26/23 115 lb 9.6 oz (52.4 kg)  02/10/23 105 lb (47.6 kg)  01/26/23 107 lb (48.5 kg)      Physical Exam Vitals reviewed.  Constitutional:      General: She is not in acute distress.    Appearance: She is well-developed. She is not ill-appearing.  HENT:     Head: Normocephalic and atraumatic.  Eyes:     Pupils: Pupils are  equal, round, and reactive to light.  Neck:     Thyroid: No thyromegaly.  Cardiovascular:     Rate and Rhythm: Normal rate and regular rhythm.     Heart sounds: Normal heart sounds. No murmur heard. Pulmonary:     Effort: No respiratory distress.     Breath sounds: Normal breath sounds. No wheezing or rales.  Abdominal:     General: Bowel sounds are normal. There is no distension.     Palpations: Abdomen is soft. There is no mass.     Tenderness: There is no abdominal tenderness. There is no guarding or rebound.  Musculoskeletal:        General: Normal range of motion.      Cervical back: Normal range of motion and neck supple.     Right lower leg: No edema.     Left lower leg: No edema.  Lymphadenopathy:     Cervical: No cervical adenopathy.  Skin:    Findings: No rash.  Neurological:     Mental Status: She is alert and oriented to person, place, and time.     Cranial Nerves: No cranial nerve deficit.  Psychiatric:        Behavior: Behavior normal.        Thought Content: Thought content normal.        Judgment: Judgment normal.      No results found for any visits on 04/26/23.    The 10-year ASCVD risk score (Arnett DK, et al., 2019) is: 12.8%    Assessment & Plan:   Problem List Items Addressed This Visit   None Visit Diagnoses     Physical exam    -  Primary   Relevant Orders   Basic metabolic panel   Lipid panel   CBC with Differential/Platelet   Hepatic function panel   Hemoglobin A1c     Patient has history of hypertension currently uncontrolled but not taking her amlodipine regularly.  She has history of meningioma with prior surgical excision with no evidence for recurrence on recent MRI scan.  She is followed by GYN for osteoporosis and getting mammograms and DEXA scans through them we discussed the following health maintenance  -Recommend annual flu vaccine -Consider Shingrix vaccine and she will check on insurance coverage -Check labs as above.  Include A1c as she has had some prior mildly elevated glucoses in the prediabetes range -Continue regular exercise habits -Repeat colonoscopy 2030 -Consider repeat tetanus next year -Pneumonia vaccine complete -Set up 1 month follow-up to reassess blood pressure on regular use of amlodipine to make sure at goal  Return in about 1 month (around 05/27/2023).    Barbara Peat, MD

## 2023-04-27 ENCOUNTER — Encounter (INDEPENDENT_AMBULATORY_CARE_PROVIDER_SITE_OTHER): Payer: Self-pay

## 2023-05-02 ENCOUNTER — Other Ambulatory Visit (INDEPENDENT_AMBULATORY_CARE_PROVIDER_SITE_OTHER): Payer: Medicare HMO

## 2023-05-02 DIAGNOSIS — Z0000 Encounter for general adult medical examination without abnormal findings: Secondary | ICD-10-CM | POA: Diagnosis not present

## 2023-05-02 LAB — LIPID PANEL
Cholesterol: 235 mg/dL — ABNORMAL HIGH (ref 0–200)
HDL: 68 mg/dL (ref >39.00)
LDL Cholesterol: 150 mg/dL — ABNORMAL HIGH (ref 0–99)
NonHDL: 167.29
Total CHOL/HDL Ratio: 3
Triglycerides: 86 mg/dL (ref 0.0–149.0)
VLDL: 17.2 mg/dL (ref 0.0–40.0)

## 2023-05-02 LAB — BASIC METABOLIC PANEL
BUN: 13 mg/dL (ref 6–23)
CO2: 30 mEq/L (ref 19–32)
Calcium: 9.4 mg/dL (ref 8.4–10.5)
Chloride: 102 mEq/L (ref 96–112)
Creatinine, Ser: 0.54 mg/dL (ref 0.40–1.20)
GFR: 95.21 mL/min (ref >60.00)
Glucose, Bld: 107 mg/dL — ABNORMAL HIGH (ref 70–99)
Potassium: 4.2 mEq/L (ref 3.5–5.1)
Sodium: 139 mEq/L (ref 135–145)

## 2023-05-02 LAB — CBC WITH DIFFERENTIAL/PLATELET
Basophils Absolute: 0.1 10*3/uL (ref 0.0–0.1)
Basophils Relative: 0.9 % (ref 0.0–3.0)
Eosinophils Absolute: 0.2 10*3/uL (ref 0.0–0.7)
Eosinophils Relative: 3.8 % (ref 0.0–5.0)
HCT: 43.6 % (ref 36.0–46.0)
Hemoglobin: 14.3 g/dL (ref 12.0–15.0)
Lymphocytes Relative: 41.1 % (ref 12.0–46.0)
Lymphs Abs: 2.2 10*3/uL (ref 0.7–4.0)
MCHC: 32.9 g/dL (ref 30.0–36.0)
MCV: 93.7 fl (ref 78.0–100.0)
Monocytes Absolute: 0.5 10*3/uL (ref 0.1–1.0)
Monocytes Relative: 8.5 % (ref 3.0–12.0)
Neutro Abs: 2.5 10*3/uL (ref 1.4–7.7)
Neutrophils Relative %: 45.7 % (ref 43.0–77.0)
Platelets: 179 10*3/uL (ref 150.0–400.0)
RBC: 4.66 Mil/uL (ref 3.87–5.11)
RDW: 13.7 % (ref 11.5–15.5)
WBC: 5.4 10*3/uL (ref 4.0–10.5)

## 2023-05-02 LAB — HEPATIC FUNCTION PANEL
ALT: 14 U/L (ref 0–35)
AST: 14 U/L (ref 0–37)
Albumin: 4.3 g/dL (ref 3.5–5.2)
Alkaline Phosphatase: 49 U/L (ref 39–117)
Bilirubin, Direct: 0.1 mg/dL (ref 0.0–0.3)
Total Bilirubin: 0.7 mg/dL (ref 0.2–1.2)
Total Protein: 7.4 g/dL (ref 6.0–8.3)

## 2023-05-02 LAB — HEMOGLOBIN A1C: Hgb A1c MFr Bld: 6 % (ref 4.6–6.5)

## 2023-05-03 ENCOUNTER — Telehealth: Payer: Self-pay | Admitting: Family Medicine

## 2023-05-03 NOTE — Telephone Encounter (Signed)
Please see result note 

## 2023-05-03 NOTE — Telephone Encounter (Signed)
Pt is calling back and would like her result

## 2023-05-16 ENCOUNTER — Encounter: Payer: Medicare HMO | Attending: Psychology | Admitting: Psychology

## 2023-05-16 ENCOUNTER — Encounter: Payer: Self-pay | Admitting: Psychology

## 2023-05-16 DIAGNOSIS — D329 Benign neoplasm of meninges, unspecified: Secondary | ICD-10-CM | POA: Diagnosis not present

## 2023-05-16 DIAGNOSIS — F09 Unspecified mental disorder due to known physiological condition: Secondary | ICD-10-CM | POA: Insufficient documentation

## 2023-05-16 DIAGNOSIS — D496 Neoplasm of unspecified behavior of brain: Secondary | ICD-10-CM | POA: Insufficient documentation

## 2023-05-16 NOTE — Progress Notes (Signed)
Neuropsychological Evaluation   Patient:  Barbara Kim   DOB: [birth year]  MR Number: [id number]  Location: [facility] [place] [facility] [address] [place], [address] [place] [address] Dept: [phone]  Start: 4 PM End: 5 PM  Provider/Observer:     Barbara Coria PsyD  Chief Complaint:      Chief Complaint  Patient presents with   Memory Loss   Other    Word finding and expressive language changes    Reason For Service:     Barbara Kim is a 67 year old female referred for neuropsychological consultation and evaluation by her treating neurosurgeon Barbara Renshaw, MD due to difficulties with memory and cognitive efficiency.  The patient has a past medical history that includes hypertension and a history of meningioma of the brain in 2022.  Patient has had recent MRI brain that showed no residual tumor or recurrence but family have noted concerns about changes in personality and cognitive changes.  The patient reports that she , in general patient is described as a very busy person and gets easily distracted when doing things and has been noting more difficulty with forgetting objects such as her keys or phone.  Patient reports that she has had a lifelong issue with forgetfulness but tended to remember long-term events without apparent difficulty.  The patient reports that her memory are more episodic difficulties than semantic memory deficits.  Patient notes that the referral for neuropsychological assessment was requested by her daughter/family.   The patient notes only occasional or infrequent difficulties with remembering or maintaining effective cognitive functioning.  The patient reports some word finding difficulties and that cueing helps with recall.  Patient reports that after her surgery for her meningioma in May 2022, that she has noted some changes in personality and has become more  outgoing and impulsive and that she is essentially more extroverted than before.  The patient reports that she has experienced some tremors with her left hand, which have been noted by her nurse as well as friends and noted to have been present in April 2022.  Patient notes that she has been working on trying to improve her memory functions.   The patient notes that her friend has noted a slight tremor and changes in memory functions.  There is an extended friends group that talk about the patient having more episodic memory difficulties.  These are noted to have developed after her surgery and have improved some with rehabilitation efforts.  The patient is very physically active with running and biking activities and displays a good joy for life as noted by her friend.  Patient is noted to have had physical, occupational and speech therapy postsurgery.   Medical History:                         Past Medical History:  Diagnosis Date   Chicken pox     Hypertension     Hyperthyroidism      25 years ago                                                               Patient Active Problem List    Diagnosis Date Noted  H/O meningioma of the brain 04/21/2021   Brain tumor (HCC) 02/20/2021   Essential hypertension, benign 03/25/2014      Onset and Duration of Symptoms: Patient developed changes post surgical intervention for meningioma in May 2022.  Patient has participated in OT/PT/speech therapies after surgery with significant improvement but continued memory, personality change and mild tremor.   Progression of Symptoms: Symptoms have improved from initial difficulties postsurgery but continues with memory and other symptoms.   Triggering Factors: Status post meningioma surgery with left frontal craniotomy and resection of left sphenoid wing meningioma   Associated Symptoms (e.g., cognitive, emotional, behavioral): Changes in memory, personality style with more impulsiveness and  extraversion type behavioral styles.   Additional Tests and Measures from other records:   Neuroimaging Results: pneumocephalus, mass effect, and midline shift, and decreased left extra-axial fluid collection.   Current Typical Mood State:  Positive and Euthymic   Sleep: Patient reports that she goes to bed at 8 PM at night and wakes up at 4 AM in the morning and generally feels well rested.  Patient reports occasional snoring if she sleeps flat on her back.   Diet Pattern: Patient describes a good appetite and reports that she eats a healthy diet.  Tests Administered: Controlled Oral Word Association Test (COWAT; FAS & Animals)  Finger Tapping Test (FTT) Grooved Pegboard Wechsler Adult Intelligence Scale, 4th Edition (WAIS-IV) Wechsler Memory Scale, 4th Edition (WMS-IV); Adult Battery  Participation Level:   Active  Participation Quality:  Appropriate      Behavioral Observation:  The patient appeared well-groomed and appropriately dressed. Her manners were polite and appropriate to the situation. The patient's attitude towards testing was positive and her effort was good.   Well Groomed, Alert, and Appropriate.   Test Results:   Initially, an estimation was made as to the patient's historic/premorbid intellectual and cognitive functioning.  Patient graduated from college in the [place] doing well in [place] and science classes but had some relative difficulties with math during school.  Patient has had further educational opportunities related to training and massage and bodywork therapy done in the [place].  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  Reviewing the patient's educational and occupational history we will look at a conservative estimate for the patient's premorbid intellectual and cognitive functioning to be in the  average range with some individual cognitive domains likely falling in the high average range relative to a normative population.  Secondly, an estimation as to the validity of the current objective neuropsychological test results.  The patient appeared to try her hardest throughout the assessment and was appropriate throughout remaining very positive with good effort.  Embedded validity checks including aspects of the digit span test and forced choice memory challenges all suggest that the patient tried her hardest throughout without attempt to exaggerate or minimize current symptomatology.  This does appear to be a fair and valid assessment of the patient's current cognitive status.   COWAT:  FAS total= 33 Z= -.40  Animals total= 21 Z= .72   While the patient did not describe or indicate any significant expressive language deficits her meningioma was noted in the left sphenoid wing area that was producing a mass effect and midline shift located in the lower posterior left frontal brain region.  To assess for possible impact on language functions the  patient was administered the control or word association test.  On the FAS test which is a measure of lexical fluency, the patient performed in the average range without indications of significant change in lexical fluency.  On the animal naming test, which assesses semantic fluency the patient also did well in performing at the upper end of the average range on this measure.  Behavioral observations and objective assessment of expressive language capacity did not suggest any significant change in expressive language capacity.  Finger Tapping Test (FTT):  R [facility]) Average= 62  Percentile Rank= 82nd  L ([facility]) Average= 47.4  Percentile Rank= 64th    The patient was also administered the finger tapping Test to look for any lateralization findings related to fine motor speed.  On her right dominant hand the patient performed at the 82nd percentile and on  her left nondominant hand she performed at the 64th percentile.  Both of these are excellent performances without any indication of lateralization of findings and no indications of significant motor involvement.  Grooved Pegboard:  R (DH) time= 127s  Percentile Rank= 25th  L (NDH) time= 141s  Percentile Rank= 30th   We also administered the grooved pegboard test to look at fine motor control.  On the patient's right dominant hand she performed in the lower end of the average range at the 25th percentile and her left nondominant hand was only slightly better at the 30th percentile.  There was no indication suggesting significant lateralization of findings.  While the patient's fine motor speed outpaced her fine motor control on objective measures they both were in the average to high average range without indication of deficits.   WAIS-IV:             Composite Score Summary          Scale Sum of Scaled Scores Composite Score Percentile Rank 95% Conf. Interval Qualitative Description  Verbal Comprehension 30 VCI 100 50 94-106 Average  Perceptual Reasoning 31 PRI 102 55 96-108 Average  Working Memory 13 WMI 80 9 74-88 Low Average  Processing Speed 19 PSI 97 42 89-106 Average  Full Scale 93 FSIQ 95 37 91-99 Average  General Ability 61 GAI 101 53 96-106 Average    In order to provide a thorough assessment of a wide range and varying cognitive domains and a highly normed well standardized battery of measures the patient was administered the Wechsler Adult Intelligence Scale-IV.  As the patient has described some changes in cognitive function particular around memory and impulsiveness the current measures should not be used as a descriptor of her lifelong cognitive status but as a descriptor of her current functioning patterns.  2 composite/global composite measures were calculated.  The patient produced a full-scale IQ score of 95 which falls at the 37th percentile and is in the average range.   This Global composite score is only slightly below predictions of premorbid status and would suggest no more than 1 cognitive domain is likely to be significantly impacted.  We also calculated the patient's general abilities index score which places less emphasis on items that are the most sensitive to acute change including working memory/auditory encoding and information processing speed variables.  The patient produced a general abilities index score of 101 which falls at the 53rd percentile in the average range.  This increase is accounted for by specific and isolated weaknesses related to auditory encoding and working memory.           Verbal Comprehension  Subtests Summary        Subtest Raw Score Scaled Score Percentile Rank Reference Group Scaled Score SEM  Similarities 24 10 50 10 1.04  Vocabulary 33 9 37 9 0.67  Information 17 11 63 12 0.73    The patient produced a verbal comprehension index score of 100 which falls at the 50th percentile in the average range relative to a normative population.  There was little to no scatter noted in subtest performance in all variables in this domain were in the average range.  The patient performed in the average range on measures of verbal reasoning and problem-solving, vocabulary knowledge and access to longstanding information and knowledge and her general fund of information.  These performances would be consistent with estimates of premorbid functioning and not indicative of any changes in language based skills.           Perceptual Reasoning Subtests Summary        Subtest Raw Score Scaled Score Percentile Rank Reference Group Scaled Score SEM  Block Design 36 11 63 8 1.08  Matrix Reasoning 13 10 50 7 0.90  Visual Puzzles 11 10 50 7 0.85    The patient produced a perceptual reasoning index score of 102 which falls at the 55th percentile and in the average range relative to a normative population.  Again, there was little scatter noted in  subtest performance with the patient doing well on measures of visual analysis and organizational skills, nonverbal reasoning and broad visual intelligence as well as fluid reasoning skills and attention to visual details.  There were no indications of changes in visual spatial/visual constructional capacities.           Working Comptroller Raw Score Scaled Score Percentile Rank Reference Group Scaled Score SEM  Digit Span 20 7 16 6  0.79  Arithmetic 9 6 9 6  0.99    The patient produced a working memory index score of 80 which falls at the 9th percentile and in the low average range relative to normative population.  This is a performance that is below predicted levels of premorbid functioning especially taking into account her capacity to graduate from college and continue her education.  It is also inconsistent with her occupational history suggesting change in areas of auditory encoding and her ability to process information in her active auditory Register.          Processing Speed Subtests Summary        Subtest Raw Score Scaled Score Percentile Rank Reference Group Scaled Score SEM  Symbol Search 24 9 37 7 1.31  Coding 56 10 50 7 0.99    The patient produced a processing speed index score of 97 which falls at the 42nd percentile and in the average range relative to a normative population.  There was little to no scatter within subtest performance with each of the measures following in the average range suggesting intact visual scanning, visual searching and overall speed of mental operations.  WMS-IV:             Index Score Summary        Index Sum of Scaled Scores Index Score Percentile Rank 95% Confidence Interval Qualitative Descriptor  Auditory Memory (AMI) 39 98 45 92-104 Average  Visual Memory (VMI) 38 96 39 91-102 Average  Visual Working Memory (VWMI) 16 88 21 82-96 Low Average  Immediate Memory (IMI) 36 93 32 87-100 Average  Delayed Memory (  DMI)  41 102 55 95-109 Average    As the patient and friends have been describing observed changes in cognitive functioning particularly on memory the patient was administered the Wechsler Memory Scale-IV to provide a thorough structured assessment of a wide range of attentional and memory domains.  The patient showed isolated and specific weaknesses on the Wechsler Adult Intelligence Scale related to auditory encoding and capacity to actively process information in her auditory Register.  Very similar finding was also noted with regard to her visual working memory on the [employer].  The patient produced a visual working memory index score of 88 which falls at the 21st percentile and in the low average range relative to a normative population.  This, again, was essentially the only domain that showed any significant change or weakness from premorbid estimates.  Both her auditory and visual encoding appear to be lower than predicted levels of premorbid functioning.  Breaking memory functions down between auditory and visual memory the patient produced an auditory memory index score of 98 which falls at the 45th percentile and in the average range relative to a normative population.  A very similar pattern was noted with her visual memory as she produced a visual working memory of 96 which fell at the 39th percentile and also in the average range.  While these performances may be slightly below predicted levels of premorbid functioning they are better than what would be predicted by her visual and auditory working memory components.  Breaking memory functions down between immediate versus delayed the patient produced an immediate memory index score of 93 which falls at the 32nd percentile and only slightly below predicted levels of premorbid functioning.  I suspect that this finding is directly related to primary encoding weaknesses and not related to an inability to store or organize new information.   The patient produced a delayed memory index score of 102 which falls at the 55th percentile in the average range.  This pattern would strongly suggest that information that is adequately stored and organized is available for later recall and has not lost over time.  For the most part, the patient showed good performance under recognition/cued recall measures which would be consistent with her best memory function related to retaining information over period of delay.  I suspect that the vast majority of her subjective reports of memory changes are related to changes in her auditory and visual encoding attentional measures and there do not appear to be significant cortical disruption and temporal lobe functioning.         Primary Subtest Scaled Score Summary       Subtest Domain Raw Score Scaled Score Percentile Rank  Logical Memory I AM 23 9 37  Logical Memory II AM 21 10 50  Verbal Paired Associates I AM 20 8 25   Verbal Paired Associates II AM 11 12 75  Designs I VM 55 9 37  Designs II VM 44 9 37  Visual Reproduction I VM 31 10 50  Visual Reproduction II VM 21 10 50  Spatial Addition VWM 8 9 37  Symbol Span VWM 11 7 16           Auditory Memory Process Score Summary      Process Score Raw Score Scaled Score Percentile Rank Cumulative Percentage (Base Rate)  LM II Recognition 18 - - 3-9%  VPA II Recognition 40 - - >75%  VPA II Word Recall 22 15 95 -  Visual Memory Process Score Summary      Process Score Raw Score Scaled Score Percentile Rank Cumulative Percentage (Base Rate)  DE I Content 41 15 95 -  DE I Spatial 14 9 37 -  DE II Content 34 12 75 -  DE II Spatial 10 9 37 -  DE II Recognition 14 - - 51-75%  VR II Recognition 5 - - 26-50%  VR II Copy 37 - - 3-9%    ABILITY-MEMORY ANALYSIS   Ability Score:    VCI: 100 Date of Testing:           WAIS-IV; WMS-IV 2022/11/12            Predicted Difference Method    Index Predicted WMS-IV Index Score  Actual WMS-IV Index Score Difference Critical Value   Significant Difference Y/N Base Rate  Auditory Memory 100 98 2 10.15 N    Visual Memory 100 96 4 9.30 N    Visual Working Memory 100 88 12 11.68 Y 15-20%  Immediate Memory 100 93 7 11.15 N    Delayed Memory 100 102 -2 11.49 N      Summary of Results:   Overall, the results of the current neuropsychological evaluation are consistent with the patient's description of changes in function related to memory and learning and some changes in personality style including being more extroverted and having more difficulties with impulse control and being more impulsive in general.  In the formal setting of the cognitive assessment the patient was able to inhibit most impulsive/inappropriate behaviors.  For the most part almost all of the cognitive domains assessed were generally consistent with predicted levels of premorbid functioning and there was no indication of global cognitive change.  Memory functions were quite good in fact once we account for the attentional components related to changes in both auditory and visual encoding capacity.  These encoding measures are the prerequisite functions required for effective storage and organization of new learned information.  The fact that she is able to do as well as she did on actual storage and organization of both auditory and visual information suggest that there is a very focal change related to attention/auditory and visual encoding and the patient does not actually have an impaired ability to store and organize information that is not accounted for by these encoding and active processing of information in her auditory and visual Register functions.  There was no indication of significant lateralization of motor functions, no indications of visual spatial visual processing deficits and no indications of significant changes in executive functioning outside the attentional elements  noted.  Impression/Diagnosis:   The results of the current neuropsychological evaluation are quite encouraging and are generally consistent with patterns of changes that could be explained by her left sphenoid wing meningioma status postresection following craniotomy.  There was mass effect noted prior to surgical interventions.  Neuropsychological assessment would suggest that the primary focal area of cognitive change would be consistent with some subcortical impact as well as some mild left prefrontal brain regions being impacted likely from mass effect of the tumor and not a direct result of the craniotomy/resection procedure itself.  The primary cognitive change has to do with changes in auditory and visual encoding ability and ability to actively process information in her auditory and visual Register.  There are subjective reports consistent with some changes in impulse control and changes in personality style which would be explained by impacts on the left prefrontal brain region and  potentially some midbrain involvement likely from mass effect from the meningioma.  Given what has happened to the patient medically she is actually doing quite well and some of the personality changes are being well-managed by the patient and she is describing improvement over time since her neurosurgical interventions.  The patient does not actually have any deficits with regard to her capacity to store and organize new information and her difficulties remembering new events (episodic memory) are likely directly related to changes in attentional capacity around auditory and visual encoding/processing.  If the information is able to be maintain long enough in her active auditory or visual Register it is effectively being transferred into short-term memory and then into long-term memory.  The cause of the subjective memory reports are likely directly related to attentional variables and not formal memory deficits per se.  I  will sit down with the patient and go over the results of the current neuropsychological evaluation which are, in fact, quite encouraging.  I will address specific recommendations for the patient to try to adapt and counteract some of the changes she is having around attention/auditory visual encoding which are playing the primary role for her memory difficulties and the likely impact of the meningioma on either prefrontal regions or white matter connections between prefrontal brain regions and midbrain/limbic system regions.  Given the likely causative factors the patient should not expect any progression of difficulties and this is clearly not a pattern consistent with any type of degenerative process.  I will spend time during the feedback session going into greater detail regarding recommendations.  Diagnosis:    Cognitive and neurobehavioral dysfunction  Left sphenoid wing meningioma involving cavernous sinus (HCC)  Brain tumor (HCC)   _____________________ Arley Phenix, Psy.D. Clinical Neuropsychologist

## 2023-05-17 ENCOUNTER — Encounter: Payer: Medicare HMO | Admitting: Psychology

## 2023-05-20 ENCOUNTER — Telehealth: Payer: Self-pay | Admitting: Family Medicine

## 2023-05-20 MED ORDER — TRIAMCINOLONE ACETONIDE 0.1 % EX CREA: 1.0000 | TOPICAL_CREAM | Freq: Two times a day (BID) | CUTANEOUS | 2 refills | Status: AC | PRN

## 2023-05-20 NOTE — Telephone Encounter (Signed)
Prescription Request  05/20/2023  LOV: 04/26/2023  What is the name of the medication or equipment?  triamcinolone cream (KENALOG) 0.1 %   Have you contacted your pharmacy to request a refill? No   Which pharmacy would you like this sent to?   [id number] [place], [place] - [address] [place] [address] Phone: [phone] Fax: [phone]    Patient notified that their request is being sent to the clinical staff for review and that they should receive a response within 2 business days.   Please advise at

## 2023-05-20 NOTE — Addendum Note (Signed)
Addended by: Christy Sartorius on: 05/20/2023 04:27 PM   Modules accepted: Orders

## 2023-05-20 NOTE — Telephone Encounter (Signed)
Rx sent 

## 2023-05-27 ENCOUNTER — Ambulatory Visit: Payer: Medicare HMO | Admitting: Family Medicine

## 2023-06-21 ENCOUNTER — Telehealth: Payer: Self-pay | Admitting: *Deleted

## 2023-06-21 NOTE — Telephone Encounter (Signed)
Called to have her appt time and date verified. I have verified it as 09/15/23 @ 3 pm with Dr Kieth Brightly.

## 2023-07-02 ENCOUNTER — Other Ambulatory Visit: Payer: Self-pay | Admitting: Family Medicine

## 2023-07-06 NOTE — Telephone Encounter (Signed)
Per note from 04/26/2023:  "Set up 1 month follow-up to reassess blood pressure on regular use of amlodipine to make sure at goal   Return in about 1 month (around 05/27/2023). "  Attempted to reach pt. Left a detail message to call us back to set up a follow up appt. And will send in 30 days supply.

## 2023-07-07 ENCOUNTER — Ambulatory Visit: Payer: Medicare HMO | Admitting: Psychology

## 2023-08-10 ENCOUNTER — Other Ambulatory Visit: Payer: Self-pay | Admitting: Family Medicine

## 2023-08-10 DIAGNOSIS — M810 Age-related osteoporosis without current pathological fracture: Secondary | ICD-10-CM

## 2023-09-15 ENCOUNTER — Encounter: Payer: Medicare HMO | Attending: Psychology | Admitting: Psychology

## 2023-09-15 DIAGNOSIS — F09 Unspecified mental disorder due to known physiological condition: Secondary | ICD-10-CM | POA: Insufficient documentation

## 2023-09-15 DIAGNOSIS — D496 Neoplasm of unspecified behavior of brain: Secondary | ICD-10-CM | POA: Diagnosis not present

## 2023-09-15 DIAGNOSIS — D329 Benign neoplasm of meninges, unspecified: Secondary | ICD-10-CM | POA: Insufficient documentation

## 2023-09-28 DIAGNOSIS — I639 Cerebral infarction, unspecified: Secondary | ICD-10-CM

## 2023-09-28 HISTORY — DX: Cerebral infarction, unspecified: I63.9

## 2023-11-04 ENCOUNTER — Other Ambulatory Visit: Payer: Self-pay | Admitting: Family Medicine

## 2024-01-16 ENCOUNTER — Encounter: Payer: Self-pay | Admitting: Psychology

## 2024-01-16 NOTE — Progress Notes (Signed)
 Neuropsychological Evaluation   Patient:  Barbara Kim   DOB: 1956/09/12  MR Number: [id number]  Location: [facility] [place] [facility] [address] [place], [address] [place] [address] Dept: [phone]  Start: 4 PM End: 5 PM  Provider/Observer:     Marrion Sjogren PsyD  Chief Complaint:      Chief Complaint  Patient presents with   Memory Loss   Other    Word finding and expressive language changes    09/15/2023 9 AM-10 AM: Today I provided feedback regarding the results of the recent neuropsychological evaluation.  We went over the results of the recent neuropsychological evaluation today with specific recommendations for the patient that were not included in the formal neuropsych evaluation.  I have included a copy of the reason for service and summary of the evaluation below for convenience and the complete neuropsych evaluation can be found in the patient's EMR dated 05/16/2023.  Reason For Service:     Barbara Kim is a 68 year old female referred for neuropsychological consultation and evaluation by her treating neurosurgeon Barbara Blonder, MD due to difficulties with memory and cognitive efficiency.  The patient has a past medical history that includes hypertension and a history of meningioma of the brain in 2022.  Patient has had recent MRI brain that showed no residual tumor or recurrence but family have noted concerns about changes in personality and cognitive changes.  The patient reports that she , in general patient is described as a very busy person and gets easily distracted when doing things and has been noting more difficulty with forgetting objects such as her keys or phone.  Patient reports that she has had a lifelong issue with forgetfulness but tended to remember long-term events without apparent difficulty.  The patient reports that her memory are more episodic difficulties than  semantic memory deficits.  Patient notes that the referral for neuropsychological assessment was requested by her daughter/family.   The patient notes only occasional or infrequent difficulties with remembering or maintaining effective cognitive functioning.  The patient reports some word finding difficulties and that cueing helps with recall.  Patient reports that after her surgery for her meningioma in May 2022, that she has noted some changes in personality and has become more outgoing and impulsive and that she is essentially more extroverted than before.  The patient reports that she has experienced some tremors with her left hand, which have been noted by her nurse as well as friends and noted to have been present in April 2022.  Patient notes that she has been working on trying to improve her memory functions.   The patient notes that her friend has noted a slight tremor and changes in memory functions.  There is an extended friends group that talk about the patient having more episodic memory difficulties.  These are noted to have developed after her surgery and have improved some with rehabilitation efforts.  The patient is very physically active with running and biking activities and displays a good joy for life as noted by her friend.  Patient is noted to have had physical, occupational and speech therapy postsurgery.    Impression/Diagnosis:   The results of the current neuropsychological evaluation are quite encouraging and are generally consistent with patterns of changes that could be explained by her left sphenoid wing meningioma status postresection following craniotomy.  There was mass effect noted prior to surgical interventions.  Neuropsychological assessment would suggest  that the primary focal area of cognitive change would be consistent with some subcortical impact as well as some mild left prefrontal brain regions being impacted likely from mass effect of the tumor and not a direct  result of the craniotomy/resection procedure itself.  The primary cognitive change has to do with changes in auditory and visual encoding ability and ability to actively process information in her auditory and visual Register.  There are subjective reports consistent with some changes in impulse control and changes in personality style which would be explained by impacts on the left prefrontal brain region and potentially some midbrain involvement likely from mass effect from the meningioma.  Given what has happened to the patient medically she is actually doing quite well and some of the personality changes are being well-managed by the patient and she is describing improvement over time since her neurosurgical interventions.  The patient does not actually have any deficits with regard to her capacity to store and organize new information and her difficulties remembering new events (episodic memory) are likely directly related to changes in attentional capacity around auditory and visual encoding/processing.  If the information is able to be maintain long enough in her active auditory or visual Register it is effectively being transferred into short-term memory and then into long-term memory.  The cause of the subjective memory reports are likely directly related to attentional variables and not formal memory deficits per se.  I will sit down with the patient and go over the results of the current neuropsychological evaluation which are, in fact, quite encouraging.  I will address specific recommendations for the patient to try to adapt and counteract some of the changes she is having around attention/auditory visual encoding which are playing the primary role for her memory difficulties and the likely impact of the meningioma on either prefrontal regions or white matter connections between prefrontal brain regions and midbrain/limbic system regions.  Given the likely causative factors the patient should not  expect any progression of difficulties and this is clearly not a pattern consistent with any type of degenerative process.  I will spend time during the feedback session going into greater detail regarding recommendations.  Diagnosis:    Cognitive and neurobehavioral dysfunction  Left sphenoid wing meningioma involving cavernous sinus (HCC)  Brain tumor (HCC)   _____________________ Chapman Commodore, Psy.D. Clinical Neuropsychologist

## 2024-01-30 ENCOUNTER — Ambulatory Visit (INDEPENDENT_AMBULATORY_CARE_PROVIDER_SITE_OTHER): Payer: Medicare HMO

## 2024-01-30 VITALS — BP 120/62 | HR 62 | Temp 98.3°F | Ht <= 58 in | Wt 120.7 lb

## 2024-01-30 DIAGNOSIS — Z0000 Encounter for general adult medical examination without abnormal findings: Secondary | ICD-10-CM | POA: Diagnosis not present

## 2024-01-30 NOTE — Patient Instructions (Addendum)
 Ms. Mcglathery , Thank you for taking time to come for your Medicare Wellness Visit. I appreciate your ongoing commitment to your health goals. Please review the following plan we discussed and let me know if I can assist you in the future.   Referrals/Orders/Follow-Ups/Clinician Recommendations:   This is a list of the screening recommended for you and due dates:  Health Maintenance  Topic Date Due   Zoster (Shingles) Vaccine (1 of 2) Never done   COVID-19 Vaccine (3 - 2024-25 season) 05/29/2023   DTaP/Tdap/Td vaccine (2 - Td or Tdap) 03/25/2024   Flu Shot  04/27/2024   Mammogram  07/14/2024   Medicare Annual Wellness Visit  01/29/2025   Colon Cancer Screening  07/12/2029   Pneumonia Vaccine  Completed   DEXA scan (bone density measurement)  Completed   Hepatitis C Screening  Completed   HPV Vaccine  Aged Out   Meningitis B Vaccine  Aged Out    Advanced directives: (Declined) Advance directive discussed with you today. Even though you declined this today, please call our office should you change your mind, and we can give you the proper paperwork for you to fill out.  Next Medicare Annual Wellness Visit scheduled for next year: Yes

## 2024-01-30 NOTE — Progress Notes (Signed)
 Subjective:   Barbara Kim is a 68 y.o. who presents for a Medicare Wellness preventive visit.  Visit Complete: In person    Persons Participating in Visit: Patient.  AWV Questionnaire: No: Patient Medicare AWV questionnaire was not completed prior to this visit.  Cardiac Risk Factors include: advanced age (>67men, >47 women);hypertension     Objective:    Today's Vitals   01/30/24 0807  BP: 120/62  Pulse: 62  Temp: 98.3 F (36.8 C)  TempSrc: Oral  SpO2: 95%  Weight: 120 lb 11.2 oz (54.7 kg)  Height: 4\' 8"  (1.422 m)   Body mass index is 27.06 kg/m.     01/30/2024    8:24 AM 01/26/2023    8:18 AM 01/11/2022   10:16 AM 03/12/2021    2:03 PM 03/11/2021    5:13 PM 02/17/2021    8:20 AM  Advanced Directives  Does Patient Have a Medical Advance Directive? No No No No No No  Would patient like information on creating a medical advance directive? No - Patient declined  No - Patient declined No - Patient declined No - Patient declined     Current Medications (verified) Outpatient Encounter Medications as of 01/30/2024  Medication Sig   alendronate (FOSAMAX) 70 MG tablet TAKE 1 TABLET BY MOUTH ONE TIME PER WEEK   amLODipine  (NORVASC ) 5 MG tablet TAKE 1 TABLET (5 MG TOTAL) BY MOUTH DAILY.   triamcinolone  cream (KENALOG ) 0.1 % Apply 1 Application topically 2 (two) times daily as needed.   Vitamin D , Ergocalciferol , (DRISDOL ) 50000 units CAPS capsule TAKE 1 CAPSULE (50,000 UNITS TOTAL) BY MOUTH EVERY 7 (SEVEN) DAYS.   No facility-administered encounter medications on file as of 01/30/2024.    Allergies (verified) Corn-containing products   History: Past Medical History:  Diagnosis Date   Chicken pox    Hypertension    Hyperthyroidism    25 years ago   Past Surgical History:  Procedure Laterality Date   APPLICATION OF CRANIAL NAVIGATION Left 02/20/2021   Procedure: APPLICATION OF CRANIAL NAVIGATION;  Surgeon: Augusto Blonder, MD;  Location: [facility] OR;  Service:  Neurosurgery;  Laterality: Left;   CRANIOTOMY Left 02/20/2021   Procedure: Stereotactic LEFT pterional craniotomy for resection of meningioma;  Surgeon: Augusto Blonder, MD;  Location[facility] OR;  Service: Neurosurgery;  Laterality: Left;   TONSILLECTOMY  1983   Family History  Adopted: Yes   Social History   Socioeconomic History   Marital status: Unknown    Spouse name: Not on file   Number of children: Not on file   Years of education: Not on file   Highest education level: Not on file  Occupational History   Not on file  Tobacco Use   Smoking status: Never   Smokeless tobacco: Never  Vaping Use   Vaping status: Never Used  Substance and Sexual Activity   Alcohol use: No   Drug use: No   Sexual activity: Not on file  Other Topics Concern   Not on file  Social History Narrative   Not on file   Social Drivers of Health   Financial Resource Strain: Low Risk  (01/30/2024)   Overall Financial Resource Strain (CARDIA)    Difficulty of Paying Living Expenses: Not hard at all  Food Insecurity: No Food Insecurity (01/30/2024)   Hunger Vital Sign    Worried About Running Out of Food in the Last Year: Never true    Ran Out of Food in the Last Year: Never true  Transportation  Needs: No Transportation Needs (01/30/2024)   PRAPARE - Administrator, Civil Service (Medical): No    Lack of Transportation (Non-Medical): No  Physical Activity: Sufficiently Active (01/30/2024)   Exercise Vital Sign    Days of Exercise per Week: 5 days    Minutes of Exercise per Session: 60 min  Stress: No Stress Concern Present (01/30/2024)   [employer] of Occupational Health - Occupational Stress Questionnaire    Feeling of Stress : Not at all  Social Connections: Socially Integrated (01/30/2024)   Social Connection and Isolation Panel [NHANES]    Frequency of Communication with Friends and Family: More than three times a week    Frequency of Social Gatherings with Friends and Family: More  than three times a week    Attends Religious Services: More than 4 times per year    Active Member of [employer] or Organizations: Yes    Attends Engineer, structural: More than 4 times per year    Marital Status: Married    Tobacco Counseling Counseling given: Not Answered    Clinical Intake:  Pre-visit preparation completed: Yes  Pain : No/denies pain     BMI - recorded: 27.06 Nutritional Status: BMI 25 -29 Overweight Nutritional Risks: None Diabetes: No  Lab Results  Component Value Date   HGBA1C 6.0 05/02/2023   HGBA1C 5.6 04/21/2021     How often do you need to have someone help you when you read instructions, pamphlets, or other written materials from your doctor or pharmacy?: 1 - Never  Interpreter Needed?: No  Information entered by :: Farris Hong LPN   Activities of Daily Living     01/30/2024    8:22 AM  In your present state of health, do you have any difficulty performing the following activities:  Hearing? 0  Vision? 0  Difficulty concentrating or making decisions? 0  Walking or climbing stairs? 0  Dressing or bathing? 0  Doing errands, shopping? 0  Preparing Food and eating ? N  Using the Toilet? N  In the past six months, have you accidently leaked urine? N  Do you have problems with loss of bowel control? N  Managing your Medications? N  Managing your Finances? N  Housekeeping or managing your Housekeeping? N    Patient Care Team: Marquetta Sit, MD as PCP - General (Family Medicine) Woodrow Hazy, MD as Consulting Physician (Obstetrics and Gynecology)  Indicate any recent Medical Services you may have received from other than [facility] providers in the past year (date may be approximate).     Assessment:   This is a routine wellness examination for Barbara Kim.  Hearing/Vision screen Hearing Screening - Comments:: Denies hearing difficulties   Vision Screening - Comments:: Wears rx glasses - up to date with routine eye exams  with  Dr Ellan Gunner   Goals Addressed               This Visit's Progress     Increase physical activity (pt-stated)        Increase mental activity.       Depression Screen     01/30/2024    8:21 AM 01/26/2023    8:20 AM 09/08/2022    2:17 PM 01/11/2022   10:12 AM 12/21/2021    9:51 AM 06/22/2019    7:47 AM  PHQ 2/9 Scores  PHQ - 2 Score 0 0 0 0 0 0  PHQ- 9 Score      0  Fall Risk     01/30/2024    8:23 AM 01/26/2023    8:19 AM 09/08/2022    2:16 PM 01/11/2022   10:15 AM 12/21/2021    9:52 AM  Fall Risk   Falls in the past year? 0 0 0 0 0  Number falls in past yr: 0 0 0 0 0  Injury with Fall? 0 0 0 0 0  Risk for fall due to : No Fall Risks Medication side effect No Fall Risks No Fall Risks No Fall Risks  Follow up Falls prevention discussed;Falls evaluation completed Falls prevention discussed;Education provided;Falls evaluation completed Falls evaluation completed  Falls evaluation completed    MEDICARE RISK AT HOME:  Medicare Risk at Home Any stairs in or around the home?: Yes If so, are there any without handrails?: No Home free of loose throw rugs in walkways, pet beds, electrical cords, etc?: Yes Adequate lighting in your home to reduce risk of falls?: Yes Life alert?: No Use of a cane, walker or w/c?: No Grab bars in the bathroom?: No Shower chair or bench in shower?: No Elevated toilet seat or a handicapped toilet?: No  TIMED UP AND GO:  Was the test performed?  Yes  Length of time to ambulate 10 feet: 10 sec Gait steady and fast without use of assistive device  Cognitive Function: 6CIT completed        01/30/2024    8:24 AM 01/26/2023    8:22 AM 01/11/2022   10:16 AM  6CIT Screen  What Year? 0 points 0 points 0 points  What month? 0 points 0 points 0 points  What time? 0 points 0 points 0 points  Count back from 20 0 points 0 points 0 points  Months in reverse 0 points 0 points 0 points  Repeat phrase 0 points 2 points 0 points  Total Score 0 points  2 points 0 points    Immunizations Immunization History  Administered Date(s) Administered   Influenza,inj,Quad PF,6+ Mos 06/22/2019   Moderna Sars-Covid-2 Vaccination 11/23/2019, 12/25/2019   PNEUMOCOCCAL CONJUGATE-20 04/21/2021   Tdap 03/25/2014    Screening Tests Health Maintenance  Topic Date Due   Zoster Vaccines- Shingrix (1 of 2) Never done   COVID-19 Vaccine (3 - 2024-25 season) 05/29/2023   DTaP/Tdap/Td (2 - Td or Tdap) 03/25/2024   INFLUENZA VACCINE  04/27/2024   MAMMOGRAM  07/14/2024   Medicare Annual Wellness (AWV)  01/29/2025   Colonoscopy  07/12/2029   Pneumonia Vaccine 61+ Years old  Completed   DEXA SCAN  Completed   Hepatitis C Screening  Completed   HPV VACCINES  Aged Out   Meningococcal B Vaccine  Aged Out    Health Maintenance  Health Maintenance Due  Topic Date Due   Zoster Vaccines- Shingrix (1 of 2) Never done   COVID-19 Vaccine (3 - 2024-25 season) 05/29/2023   Health Maintenance Items Addressed:   Additional Screening:  Vision Screening: Recommended annual ophthalmology exams for early detection of glaucoma and other disorders of the eye.  Dental Screening: Recommended annual dental exams for proper oral hygiene  Community Resource Referral / Chronic Care Management: CRR required this visit?  No   CCM required this visit?  No     Plan:     I have personally reviewed and noted the following in the patient's chart:   Medical and social history Use of alcohol, tobacco or illicit drugs  Current medications and supplements including opioid prescriptions. Patient is not currently taking  opioid prescriptions. Functional ability and status Nutritional status Physical activity Advanced directives List of other physicians Hospitalizations, surgeries, and ER visits in previous 12 months Vitals Screenings to include cognitive, depression, and falls Referrals and appointments  In addition, I have reviewed and discussed with patient  certain preventive protocols, quality metrics, and best practice recommendations. A written personalized care plan for preventive services as well as general preventive health recommendations were provided to patient.     Dewayne Ford, LPN   [id number]   After Visit Summary: (In Person-Printed) AVS printed and given to the patient  Notes: Nothing significant to report at this time.

## 2024-01-31 ENCOUNTER — Other Ambulatory Visit: Payer: Self-pay | Admitting: Neurosurgery

## 2024-01-31 DIAGNOSIS — D329 Benign neoplasm of meninges, unspecified: Secondary | ICD-10-CM

## 2024-02-10 ENCOUNTER — Other Ambulatory Visit: Payer: Self-pay | Admitting: Family Medicine

## 2024-02-28 ENCOUNTER — Ambulatory Visit: Payer: Self-pay

## 2024-02-28 NOTE — Telephone Encounter (Signed)
  Chief Complaint: L sided weakness Symptoms: sudden L sided weakness/numbness, HTN Frequency: x 10 seconds this AM Pertinent Negatives: Patient denies current neuro deficits Disposition: [x] ED /[] Urgent Care (no appt availability in office) / [] Appointment(In office/virtual)/ []  [facility]/ [] Home Care/ [] Refused Recommended Disposition /[] [facility]/ []  Follow-up with PCP Additional Notes: Pt c/o L sided weakness/numbness that lasted 10 seconds this AM while driving. Pt endorses calling friend that is HCP that instructed her to take ASA. Pt reports taking AM meds with ASA and is not currently having any neuro sx. Pt reports that HCP friend evaluated her and did not appreciate any facial drooping or speech impairment. Pt repeat BP 155/80 during call. Triager strongly advised ED for further evaluation/tx. Patient verbalized understanding and to call back/911 with worsening symptoms.    Copied from CRM [id number]. Topic: Clinical - Red Word Triage >> Feb 28, 2024  9:59 AM Luane Rumps D wrote: Red Word that prompted transfer to Nurse Triage: Patient driving this morning and had numbness on left side of body, was advised by friend who is retired Charity fundraiser to take aspirin  along amlodipine  but she is wondering if that is safe. BP was also high at 9:15 - 156/108 Reason for Disposition  [1] Weakness (i.e., paralysis, loss of muscle strength) of the face, arm / hand, or leg / foot on one side of the body AND [2] sudden onset AND [3] brief (now gone)  Answer Assessment - Initial Assessment Questions 1. SYMPTOM: "What is the main symptom you are concerned about?" (e.g., weakness, numbness)     L numbness x 10 seconds with generalized weakness 2. ONSET: "When did this start?" (minutes, hours, days; while sleeping)     This AM around 0900 Reports called friend that is HCP and evaluated  3. LAST NORMAL: "When was the last time you (the patient) were normal (no symptoms)?"     0900 4. PATTERN  "Does this come and go, or has it been constant since it started?"  "Is it present now?"     Self-resolved 5. CARDIAC SYMPTOMS: "Have you had any of the following symptoms: chest pain, difficulty breathing, palpitations?"     2 weeks ago - R sided CP (self resolved) Denies current sx 6. NEUROLOGIC SYMPTOMS: "Have you had any of the following symptoms: headache, dizziness, vision loss, double vision, changes in speech, unsteady on your feet?"     Headache - self resolved Does endorse having "horizontal double vision" - hx of brain tumor and has routine MRI scheduled tomorrow Denies other sx above 7. OTHER SYMPTOMS: "Do you have any other symptoms?"     Denies Reports taking all medications this AM 8. PREGNANCY: "Is there any chance you are pregnant?" "When was your last menstrual period?"     N/a  Protocols used: Neurologic Deficit-A-AH

## 2024-02-28 NOTE — Telephone Encounter (Signed)
 Patient informed of the message below and voiced understanding. Patient advised to visit ED

## 2024-02-29 ENCOUNTER — Ambulatory Visit
Admission: RE | Admit: 2024-02-29 | Discharge: 2024-02-29 | Disposition: A | Discharge status: Home / Self Care | Source: Ambulatory Visit | Attending: Neurosurgery | Admitting: Neurosurgery

## 2024-02-29 DIAGNOSIS — D329 Benign neoplasm of meninges, unspecified: Secondary | ICD-10-CM

## 2024-02-29 MED ORDER — GADOPICLENOL 0.5 MMOL/ML IV SOLN
5.0000 mL | Freq: Once | INTRAVENOUS | Status: AC | PRN
  Administered 2024-02-29: 5 mL via INTRAVENOUS

## 2024-03-09 ENCOUNTER — Encounter: Payer: Self-pay | Admitting: Family Medicine

## 2024-03-09 ENCOUNTER — Ambulatory Visit (INDEPENDENT_AMBULATORY_CARE_PROVIDER_SITE_OTHER): Admitting: Family Medicine

## 2024-03-09 VITALS — BP 134/60 | HR 69 | Temp 98.1°F | Wt 117.8 lb

## 2024-03-09 DIAGNOSIS — R300 Dysuria: Secondary | ICD-10-CM | POA: Diagnosis not present

## 2024-03-09 LAB — POC URINALSYSI DIPSTICK (AUTOMATED)
Bilirubin, UA: NEGATIVE
Blood, UA: POSITIVE
Glucose, UA: NEGATIVE
Ketones, UA: NEGATIVE
Nitrite, UA: NEGATIVE
Protein, UA: POSITIVE — AB
Spec Grav, UA: 1.02 (ref 1.010–1.025)
Urobilinogen, UA: 0.2 U/dL
pH, UA: 6 (ref 5.0–8.0)

## 2024-03-09 MED ORDER — NITROFURANTOIN MONOHYD MACRO 100 MG PO CAPS
100.0000 mg | ORAL_CAPSULE | Freq: Two times a day (BID) | ORAL | 0 refills | Status: DC
Start: 2024-03-09 — End: 2024-03-23

## 2024-03-09 NOTE — Progress Notes (Signed)
 Established Patient Office Visit  Subjective   Patient ID: Barbara Kim, female    DOB: June 10, 1956  Age: 68 y.o. MRN: [id number]  Chief Complaint  Patient presents with   Dysuria   Vaginal Bleeding    HPI   Ms. Bobeck is seen with approximately 1 week history of some burning with urination and increased frequency.  She is also noted cloudy urine.  She has had a couple of episodes where she noticed little bit of blood with urine and just afterwards.  She feels like the blood is coming from her urine and not from her vagina.  She has not had any vaginal bleeding in between episodes of urination.  No flank pain.  No fever.  She states it has been several years since she has had previous UTI.  She has reported allergy to corn products with this was causing somewhat of a eczema flare.  No known antibiotic allergies  Past Medical History:  Diagnosis Date   Chicken pox    Hypertension    Hyperthyroidism    25 years ago   Past Surgical History:  Procedure Laterality Date   APPLICATION OF CRANIAL NAVIGATION Left 02/20/2021   Procedure: APPLICATION OF CRANIAL NAVIGATION;  Surgeon: Augusto Blonder, MD;  Location: [facility] OR;  Service: Neurosurgery;  Laterality: Left;   CRANIOTOMY Left 02/20/2021   Procedure: Stereotactic LEFT pterional craniotomy for resection of meningioma;  Surgeon: Augusto Blonder, MD;  Location: [facility] OR;  Service: Neurosurgery;  Laterality: Left;   TONSILLECTOMY  1983    reports that she has never smoked. She has never used smokeless tobacco. She reports that she does not drink alcohol and does not use drugs. family history is not on file. She was adopted. Allergies  Allergen Reactions   Corn-Containing Products Dermatitis    Review of Systems  Constitutional:  Negative for chills and fever.  Genitourinary:  Positive for dysuria and frequency. Negative for flank pain.      Objective:     BP 134/60 (BP Location: Left Arm, Patient Position: Sitting, Cuff  Size: Normal)   Pulse 69   Temp 98.1 F (36.7 C) (Oral)   Wt 117 lb 12.8 oz (53.4 kg)   SpO2 96%   BMI 26.41 kg/m  BP Readings from Last 3 Encounters:  03/09/24 134/60  01/30/24 120/62  04/27/23 (!) 150/60   Wt Readings from Last 3 Encounters:  03/09/24 117 lb 12.8 oz (53.4 kg)  01/30/24 120 lb 11.2 oz (54.7 kg)  04/26/23 115 lb 9.6 oz (52.4 kg)      Physical Exam Vitals reviewed.  Constitutional:      General: She is not in acute distress.    Appearance: She is not ill-appearing.   Cardiovascular:     Rate and Rhythm: Normal rate and regular rhythm.  Pulmonary:     Effort: Pulmonary effort is normal.     Breath sounds: Normal breath sounds.   Neurological:     Mental Status: She is alert.      Results for orders placed or performed in visit on 03/09/24  POCT Urinalysis Dipstick (Automated)  Result Value Ref Range   Color, UA Yellow    Clarity, UA Cloudy    Glucose, UA Negative Negative   Bilirubin, UA Negative    Ketones, UA Negative    Spec Grav, UA 1.020 1.010 - 1.025   Blood, UA Positive    pH, UA 6.0 5.0 - 8.0   Protein, UA Positive (A) Negative  Urobilinogen, UA 0.2 0.2 or 1.0 E.U./dL   Nitrite, UA Negative    Leukocytes, UA Moderate (2+) (A) Negative      The 10-year ASCVD risk score (Arnett DK, et al., 2019) is: 11.2%    Assessment & Plan:   Problem List Items Addressed This Visit   None Visit Diagnoses       Dysuria    -  Primary   Relevant Orders   POCT Urinalysis Dipstick (Automated) (Completed)   Urine Culture     Patient relates 1 week history of dysuria.  She has significant leukocytes and blood on dipstick.  Urine culture will be sent.  Stay well-hydrated.  Start Macrobid 1 twice daily for 5 days.  Handout on UTI given.  Be in touch if visible blood not fully clearing after treatment of UTI.  No follow-ups on file.    Glean Lamy, MD

## 2024-03-11 ENCOUNTER — Ambulatory Visit: Payer: Self-pay | Admitting: Family Medicine

## 2024-03-11 LAB — URINE CULTURE
MICRO NUMBER:: 16578024
SPECIMEN QUALITY:: ADEQUATE

## 2024-03-12 ENCOUNTER — Telehealth: Payer: Self-pay

## 2024-03-12 NOTE — Telephone Encounter (Signed)
 Copied from CRM [id number]. Topic: General - Other >> Mar 12, 2024  1:52 PM Howard Macho wrote: Reason for CRM: patient returning call to [facility] and wanting him to call her back regarding a medical question she had regarding her results

## 2024-03-12 NOTE — Telephone Encounter (Signed)
 Patient was given clarification on lab results

## 2024-03-14 ENCOUNTER — Ambulatory Visit (INDEPENDENT_AMBULATORY_CARE_PROVIDER_SITE_OTHER): Admitting: Family Medicine

## 2024-03-14 ENCOUNTER — Encounter: Payer: Self-pay | Admitting: Family Medicine

## 2024-03-14 VITALS — BP 138/62 | HR 64 | Temp 97.6°F | Wt 117.0 lb

## 2024-03-14 DIAGNOSIS — Q279 Congenital malformation of peripheral vascular system, unspecified: Secondary | ICD-10-CM | POA: Diagnosis not present

## 2024-03-14 DIAGNOSIS — I10 Essential (primary) hypertension: Secondary | ICD-10-CM | POA: Diagnosis not present

## 2024-03-14 DIAGNOSIS — M20011 Mallet finger of right finger(s): Secondary | ICD-10-CM

## 2024-03-14 NOTE — Progress Notes (Signed)
 Established Patient Office Visit  Subjective   Patient ID: Barbara Kim, female    DOB: July 18, 1956  Age: 68 y.o. MRN: [id number]  Chief Complaint  Patient presents with   Referral    HPI   Barbara Kim is seen today for several items as follows  She states she injured her right ring finger about 3 months ago.  She was working with a 10 pound weight doing some abdominal exercises and jammed her finger against the weight.  She had a bit of bleeding from the nail.  Afterwards she noted mallet deformity involving the right ring finger.  Never sought care.  Now has some flexion of the distal phalanx and inability to fully extend.  No pain.  Still has little to mild swelling.  She has skin lesion base of the nose which she states has been present since her childhood.  She thinks recently this has become a little bit larger in size.  She would like to have this treated if possible.  Has been more noticeable cosmetically.  Perhaps little darker in size  Hypertension treated with amlodipine  5 mg daily.  She has been diligent taking her medication.  Blood pressure up slightly today but generally well-controlled.  She had recent dysuria.  Culture grew out staph saprophyticus.  She was treated with Macrobid and has 1 more day left.  Symptoms of dysuria have improved and essentially resolved.  No further blood.  Past Medical History:  Diagnosis Date   Chicken pox    Hypertension    Hyperthyroidism    25 years ago   Past Surgical History:  Procedure Laterality Date   APPLICATION OF CRANIAL NAVIGATION Left 02/20/2021   Procedure: APPLICATION OF CRANIAL NAVIGATION;  Surgeon: Augusto Blonder, MD;  Location: [facility] OR;  Service: Neurosurgery;  Laterality: Left;   CRANIOTOMY Left 02/20/2021   Procedure: Stereotactic LEFT pterional craniotomy for resection of meningioma;  Surgeon: Augusto Blonder, MD;  Location: [facility] OR;  Service: Neurosurgery;  Laterality: Left;   TONSILLECTOMY  1983    reports  that she has never smoked. She has never used smokeless tobacco. She reports that she does not drink alcohol and does not use drugs. family history is not on file. She was adopted. Allergies  Allergen Reactions   Corn-Containing Products Dermatitis    Review of Systems  Constitutional:  Negative for chills, fever and malaise/fatigue.  Eyes:  Negative for blurred vision.  Respiratory:  Negative for shortness of breath.   Cardiovascular:  Negative for chest pain.  Genitourinary:  Positive for dysuria. Negative for frequency and hematuria.  Neurological:  Negative for dizziness, weakness and headaches.      Objective:     BP 138/62 (BP Location: Left Arm, Cuff Size: Normal)   Pulse 64   Temp 97.6 F (36.4 C) (Oral)   Wt 117 lb (53.1 kg)   SpO2 96%   BMI 26.23 kg/m  BP Readings from Last 3 Encounters:  03/14/24 138/62  03/09/24 134/60  01/30/24 120/62   Wt Readings from Last 3 Encounters:  03/14/24 117 lb (53.1 kg)  03/09/24 117 lb 12.8 oz (53.4 kg)  01/30/24 120 lb 11.2 oz (54.7 kg)      Physical Exam Vitals reviewed.  Constitutional:      General: She is not in acute distress.    Appearance: She is not ill-appearing.   Cardiovascular:     Rate and Rhythm: Normal rate and regular rhythm.  Pulmonary:     Effort: Pulmonary effort  is normal.     Breath sounds: Normal breath sounds. No wheezing or rales.   Musculoskeletal:     Comments: Right ring finger reveals mallet deformity involving the ring finger at the DIP joint.  Inability to fully extend at that joint.  No bony tenderness.   Skin:    Comments: She has small venous anomaly in the skin on the right side at the base of the nose.  No abnormal skin pigmentation.   Neurological:     Mental Status: She is alert.      No results found for any visits on 03/14/24.    The 10-year ASCVD risk score (Arnett DK, et al., 2019) is: 11.8%    Assessment & Plan:   #1 mallet finger involving right ring finger.   Unfortunately this happened about 3 months ago.  We explained that extension splinting at this point would be of no value.  We did offer referral to hand surgeon but she declines.  We explained that treatment may be difficult even with surgery at this point.  Functionally this is not limiting.  She agrees with observation at this point  #2 hypertension.  Up initially but came down some after rest.  Continue amlodipine  5 mg daily.  Continue low-sodium diet.  Continue regular exercise habits.  She will continue home monitoring  #3 venous anomaly involving the skin of the face.  We explained this is really more cosmetic.  No concern for any dangerous skin lesions.  She would like to consider getting this treated.  Will set up referral to [facility] dermatology  #4 recent UTI.  Symptoms resolved on Macrobid.  Follow-up for any recurrent symptoms.  Culture grew out staphlococcus saprophyticus   No follow-ups on file.    Glean Lamy, MD

## 2024-03-23 ENCOUNTER — Ambulatory Visit (INDEPENDENT_AMBULATORY_CARE_PROVIDER_SITE_OTHER): Admitting: Family Medicine

## 2024-03-23 ENCOUNTER — Encounter: Payer: Self-pay | Admitting: Family Medicine

## 2024-03-23 VITALS — BP 132/58 | HR 66 | Temp 97.8°F | Wt 119.4 lb

## 2024-03-23 DIAGNOSIS — I671 Cerebral aneurysm, nonruptured: Secondary | ICD-10-CM

## 2024-03-23 DIAGNOSIS — E785 Hyperlipidemia, unspecified: Secondary | ICD-10-CM | POA: Diagnosis not present

## 2024-03-23 DIAGNOSIS — I6522 Occlusion and stenosis of left carotid artery: Secondary | ICD-10-CM

## 2024-03-23 DIAGNOSIS — Z8673 Personal history of transient ischemic attack (TIA), and cerebral infarction without residual deficits: Secondary | ICD-10-CM

## 2024-03-23 DIAGNOSIS — I639 Cerebral infarction, unspecified: Secondary | ICD-10-CM

## 2024-03-23 MED ORDER — AMLODIPINE BESYLATE 10 MG PO TABS: 10.0000 mg | ORAL_TABLET | Freq: Every day | ORAL | 3 refills | Status: AC

## 2024-03-23 MED ORDER — ATORVASTATIN CALCIUM 40 MG PO TABS
40.0000 mg | ORAL_TABLET | Freq: Every day | ORAL | 3 refills | Status: DC
Start: 2024-03-23 — End: 2024-05-25

## 2024-03-23 NOTE — Progress Notes (Unsigned)
 Established Patient Office Visit  Subjective   Patient ID: Barbara Kim, female    DOB: [birth year]  Age: 68 y.o. MRN: [id number]  Chief Complaint  Patient presents with   Hospitalization Follow-up    HPI  {History (Optional):23778} Ms. Guidry is here today companied by a friend who is a retired Development worker, community and also she engaged her daughter by phone.  She has past medical history of hypertension, meningioma of the brain, osteoporosis.  Very health-conscious overall.  She does have history of hypertension but admits that she had not been taking her amlodipine  regularly.  She does exercise vigorously and regularly.  She was down in [place]  and had developed some headache and tingling sensation in the left lower extremity along with some weakness.  Imaging revealed subacute CVA.  She underwent several studies as outlined below.  CT angiogram head showed tiny 2 mm aneurysm arising from left clinoid/supraclinoid ICA.  MRI angiogram of the head showed no evidence for intracranial arterial occlusion or aneurysm.  Carotid ultrasound showed prominent plaque left carotid bulb but less than 50% stenosis involving right and left ICA.  MRI brain showed tiny foci of restricted diffusion involving right thalamus and posterior temporal and occipital lobes.  These were felt to be subacute.  She had lab work including A1c 6.1 and LDL cholesterol 133.  Echocardiogram was done which showed no major abnormalities.  Her blood pressure was noted to be elevated and eventually her amlodipine  was increased to 10 mg daily.  She was also started on atorvastatin 40 mg daily and aspirin  81 mg daily.  Patient was also started on 14-day outpatient cardiac monitor which is currently in place.  No prior history of CVA.  No speech changes.  No confusion.  Patient feels like she is back to baseline.  Denies any further tingling or weakness left lower extremity.  No left arm symptoms.  She is anxious to start back her  exercise routine.  Past Medical History:  Diagnosis Date   Chicken pox    Hypertension    Hyperthyroidism    25 years ago   Past Surgical History:  Procedure Laterality Date   APPLICATION OF CRANIAL NAVIGATION Left 02/20/2021   Procedure: APPLICATION OF CRANIAL NAVIGATION;  Surgeon: Lanis Pupa, MD;  Location: [facility] OR;  Service: Neurosurgery;  Laterality: Left;   CRANIOTOMY Left 02/20/2021   Procedure: Stereotactic LEFT pterional craniotomy for resection of meningioma;  Surgeon: Lanis Pupa, MD;  Location: [facility] OR;  Service: Neurosurgery;  Laterality: Left;   TONSILLECTOMY  1983    reports that she has never smoked. She has never used smokeless tobacco. She reports that she does not drink alcohol and does not use drugs. family history is not on file. She was adopted. Allergies  Allergen Reactions   Corn-Containing Products Dermatitis    Review of Systems  Constitutional:  Negative for fever and malaise/fatigue.  Eyes:  Negative for blurred vision.  Respiratory:  Negative for shortness of breath.   Cardiovascular:  Negative for chest pain.  Genitourinary:  Negative for dysuria.  Neurological:  Negative for dizziness, tingling, tremors, sensory change, speech change, focal weakness, seizures, loss of consciousness, weakness and headaches.      Objective:     BP (!) 132/58 (BP Location: Left Arm, Cuff Size: Normal)   Pulse 66   Temp 97.8 F (36.6 C) (Oral)   Wt 119 lb 6.4 oz (54.2 kg)   SpO2 95%   BMI 26.77 kg/m  BP Readings from Last  3 Encounters:  03/23/24 (!) 132/58  03/14/24 138/62  03/09/24 134/60   Wt Readings from Last 3 Encounters:  03/23/24 119 lb 6.4 oz (54.2 kg)  03/14/24 117 lb (53.1 kg)  03/09/24 117 lb 12.8 oz (53.4 kg)      Physical Exam Vitals reviewed.  Constitutional:      General: She is not in acute distress.    Appearance: She is well-developed. She is not ill-appearing.   Eyes:     Pupils: Pupils are equal, round, and reactive  to light.   Neck:     Thyroid : No thyromegaly.     Vascular: No JVD.   Cardiovascular:     Rate and Rhythm: Normal rate and regular rhythm.     Heart sounds: No murmur heard.    No gallop.  Pulmonary:     Effort: Pulmonary effort is normal. No respiratory distress.     Breath sounds: Normal breath sounds. No wheezing or rales.   Musculoskeletal:     Cervical back: Neck supple.     Right lower leg: No edema.     Left lower leg: No edema.   Neurological:     General: No focal deficit present.     Mental Status: She is alert and oriented to person, place, and time.     Cranial Nerves: No cranial nerve deficit.     Sensory: No sensory deficit.     Motor: No weakness.     Coordination: Coordination normal.     Gait: Gait normal.      No results found for any visits on 03/23/24.  Last CBC Lab Results  Component Value Date   WBC 5.4 05/02/2023   HGB 14.3 05/02/2023   HCT 43.6 05/02/2023   MCV 93.7 05/02/2023   MCH 32.3 02/23/2021   RDW 13.7 05/02/2023   PLT 179.0 05/02/2023   Last metabolic panel Lab Results  Component Value Date   GLUCOSE 107 (H) 05/02/2023   NA 139 05/02/2023   K 4.2 05/02/2023   CL 102 05/02/2023   CO2 30 05/02/2023   BUN 13 05/02/2023   CREATININE 0.54 05/02/2023   GFR 95.21 05/02/2023   CALCIUM 9.4 05/02/2023   PHOS 1.8 (L) 02/23/2021   PROT 7.4 05/02/2023   ALBUMIN 4.3 05/02/2023   BILITOT 0.7 05/02/2023   ALKPHOS 49 05/02/2023   AST 14 05/02/2023   ALT 14 05/02/2023   ANIONGAP 8 02/23/2021   Last lipids Lab Results  Component Value Date   CHOL 235 (H) 05/02/2023   HDL 68.00 05/02/2023   LDLCALC 150 (H) 05/02/2023   TRIG 86.0 05/02/2023   CHOLHDL 3 05/02/2023   Last hemoglobin A1c Lab Results  Component Value Date   HGBA1C 6.0 05/02/2023      The ASCVD Risk score (Arnett DK, et al., 2019) failed to calculate for the following reasons:   Risk score cannot be calculated because patient has a medical history suggesting  prior/existing ASCVD    Assessment & Plan:   #1 recent subacute CVA involving right thalamus, and posterior temporal and occipital lobes.  Symptomatically back to baseline at this time.  No significant ICA stenosis.  Cardiac monitor in place.  Echocardiogram unremarkable.  A1c 6.1%.  LDL cholesterol 133.  - Continue aspirin  81 mg daily -Future lab order repeat lipids with goal LDL less than 70 -Continue amlodipine  10 mg daily.  Monitor blood pressure and be in touch if not consistently less than 130/80 -Set up local neurology evaluation to  follow with us  -Complete 14-day cardiac monitor. -If cardiac monitor unrevealing appreciate neurology input regarding whether she might need a loop recorder  #2 hypertension.  Poor compliance of medication in the past.  She had been on amlodipine  5 mg daily but not taking consistently.  Now on 10 mg.  Stressed importance of compliance.  Monitor closely as above.  Continue low-sodium diet  #3 history of hyperlipidemia.  Previously on treated with statin.  Now on atorvastatin 40 mg daily.  Tolerating well thus far.  Future lab order for lipid and hepatic panel in about 6 weeks  #4 prediabetes range blood sugar with A1c 6.1.  Continue low glycemic diet Return in about 2 months (around 05/23/2024).    Wolm Scarlet, MD

## 2024-03-23 NOTE — Patient Instructions (Signed)
 I will be setting up neurology referral  Continue with aspirin  one daily  Finish out cardiac monitor  Set up future labs for lipids and liver in 6 weeks  Monitor blood pressure and be in touch if consistently > 130/80.

## 2024-03-26 ENCOUNTER — Telehealth: Payer: Self-pay

## 2024-03-26 NOTE — Telephone Encounter (Signed)
 Copied from CRM [id number]. Topic: Clinical - Medical Advice >> Mar 26, 2024  2:41 PM Arm[place] J wrote: Reason for CRM: Patient would like Dr. Micheal to look over her MR BRAIN W WO CONTRAST. The provider who went over it with her stated that she has been through a stroke and this sparked some concerns for the patient. Please call back at the earliest convenience. >> Mar 26, 2024  3:22 PM Ernestene P wrote: Pt daughter called to advise if pt does not answer then reach out to her [phone] - [place]

## 2024-03-26 NOTE — Telephone Encounter (Signed)
 Noted

## 2024-03-26 NOTE — Telephone Encounter (Signed)
 Left detailed message on patient vm informing her of the message below and to call back with any questions.

## 2024-03-26 NOTE — Telephone Encounter (Signed)
 Copied from CRM [id number]. Topic: Clinical - Medical Advice >> Mar 26, 2024  2:41 PM Barbara Kim wrote: Reason for CRM: Patient would like Dr. Micheal to look over her MR BRAIN W WO CONTRAST. The provider who went over it with her stated that she has been through a stroke and this sparked some concerns for the patient. Please call back at the earliest convenience.

## 2024-03-26 NOTE — Telephone Encounter (Signed)
 Copied from CRM [id number]. Topic: General - Other >> Mar 26, 2024  1:27 PM Robinson H wrote: Reason for CRM: Patient returned call to office gave patient message from provider as stated, patient acknowledged

## 2024-03-26 NOTE — Telephone Encounter (Signed)
 Please see previous encounter message sent to PCP

## 2024-03-26 NOTE — Telephone Encounter (Signed)
 Copied from CRM [id number]. Topic: Referral - Request for Referral >> Mar 26, 2024  1:30 PM Robinson H wrote: Did the patient discuss referral with their provider in the last year? Yes (If No - schedule appointment) (If Yes - send message)  Appointment offered? No  Type of order/referral and detailed reason for visit: Cardiologist  Preference of office, provider, location: Needs a Cardiologist in [place]  If referral order, have you been seen by this specialty before? Yes, Dr. Kennis, was out of town when she was out of town, patient currently has a heart monitor on from them (If Yes, this issue or another issue? When? Where? [facility] in [place]  Can we respond through MyChart? Yes   ----------------------------------------------------------------------- From previous Reason for Contact - Scheduling: Patient/patient representative is calling to schedule an appointment. Refer to attachments for appointment information.

## 2024-03-26 NOTE — Telephone Encounter (Signed)
 Copied from CRM [id number]. Topic: Clinical - Medical Advice >> Mar 23, 2024  4:56 PM Shereese L wrote: Reason for CRM: Patient is concerned about when she can go back to work and when she can start back driving. She would like a call back with this answer

## 2024-03-27 ENCOUNTER — Telehealth: Payer: Self-pay

## 2024-03-27 DIAGNOSIS — I639 Cerebral infarction, unspecified: Secondary | ICD-10-CM

## 2024-03-27 NOTE — Telephone Encounter (Signed)
 Patient informed of the message below and voiced understanding

## 2024-03-27 NOTE — Telephone Encounter (Signed)
 Copied from CRM [id number]. Topic: Referral - Question >> Mar 27, 2024  8:46 AM Barbara Kim DEL wrote: Reason for CRM: Patients husband thinks that she should see a cardiologist along with the neurologist. He would like to know if a referral could be sent to one in the [place] network.He stated that this is urgent and she needs to be seen right away. He said that when she had her recent attack in [place]  a cardiologist was in the mix of her care team so he would like her to continue to see one here as well. He is requesting a phone call to discuss. He stated that of wife doesn't answer ,to call his number.

## 2024-03-28 NOTE — Telephone Encounter (Signed)
 Referral placed and informed of message below

## 2024-03-28 NOTE — Addendum Note (Signed)
 Addended by: METTA KRISTEN CROME on: 03/28/2024 08:49 AM   Modules accepted: Orders

## 2024-03-29 ENCOUNTER — Telehealth: Payer: Self-pay | Admitting: Family Medicine

## 2024-03-29 NOTE — Telephone Encounter (Signed)
 Copied from CRM [id number]. Topic: Referral - Question >> Mar 29, 2024  2:13 PM Larissa RAMAN wrote: Patient's daughter, Nivea Wojdyla, requesting a callback regarding referral for Cardiology and Neurology. Callback [phone]  FU with [place]; pt daughter and she wanted to know what was the name of the places her mom was referred to and their contact info.   Provided the info; [facility] [employer] understood.   FYI.

## 2024-03-29 NOTE — Telephone Encounter (Signed)
 Copied from CRM [id number]. Topic: Referral - Question >> Mar 27, 2024  8:46 AM Burnard DEL wrote: Reason for CRM: Patients husband thinks that she should see a cardiologist along with the neurologist. He would like to know if a referral could be sent to one in the [place] network.He stated that this is urgent and she needs to be seen right away. He said that when she had her recent attack in [place]  a cardiologist was in the mix of her care team so he would like her to continue to see one here as well. He is requesting a phone call to discuss. He stated that of wife doesn't answer ,to call his number.

## 2024-04-02 ENCOUNTER — Telehealth: Payer: Self-pay | Admitting: Family Medicine

## 2024-04-02 NOTE — Telephone Encounter (Signed)
 Please see previous encounter

## 2024-04-02 NOTE — Telephone Encounter (Signed)
 Noted

## 2024-04-04 ENCOUNTER — Ambulatory Visit: Payer: Self-pay | Admitting: Neurology

## 2024-04-04 ENCOUNTER — Telehealth: Payer: Self-pay | Admitting: Neurology

## 2024-04-04 ENCOUNTER — Ambulatory Visit (INDEPENDENT_AMBULATORY_CARE_PROVIDER_SITE_OTHER): Payer: Self-pay | Admitting: Neurology

## 2024-04-04 ENCOUNTER — Encounter: Payer: Self-pay | Admitting: Neurology

## 2024-04-04 VITALS — BP 153/74 | HR 55 | Ht <= 58 in | Wt 120.0 lb

## 2024-04-04 DIAGNOSIS — I10 Essential (primary) hypertension: Secondary | ICD-10-CM | POA: Diagnosis not present

## 2024-04-04 DIAGNOSIS — I639 Cerebral infarction, unspecified: Secondary | ICD-10-CM | POA: Diagnosis not present

## 2024-04-04 DIAGNOSIS — Z8673 Personal history of transient ischemic attack (TIA), and cerebral infarction without residual deficits: Secondary | ICD-10-CM

## 2024-04-04 DIAGNOSIS — E7849 Other hyperlipidemia: Secondary | ICD-10-CM | POA: Diagnosis not present

## 2024-04-04 NOTE — Patient Instructions (Signed)
 I had a long d/w patient and her friend about her recent subacute right posterior cerebral artery branch small embolic strokes,, risk for recurrent stroke/TIAs, personally independently reviewed imaging studies and stroke evaluation results and answered questions.Continue aspirin  81 mg daily  for secondary stroke prevention and maintain strict control of hypertension with blood pressure goal below 140/90, diabetes with hemoglobin A1c goal below 6.5% and lipids with LDL cholesterol goal below 70 mg/dL. I also advised the patient to eat a healthy diet with plenty of whole grains, cereals, fruits and vegetables, exercise regularly and maintain ideal body weight .check TEE for cardiac source of embolism.  Continue ongoing 30-day heart monitor for paroxysmal A-fib and check transcranial Doppler bubble study for PFO.  She was advised to return to work and increase her physical activity and exercise gradually as tolerated.  Followup in the future with me in 6 months or call earlier if necessary.\  Stroke Prevention Some medical conditions and behaviors can lead to a higher chance of having a stroke. You can help prevent a stroke by eating healthy, exercising, not smoking, and managing any medical conditions you have. Stroke is a leading cause of functional impairment. Primary prevention is particularly important because a majority of strokes are first-time events. Stroke changes the lives of not only those who experience a stroke but also their family and other caregivers. How can this condition affect me? A stroke is a medical emergency and should be treated right away. A stroke can lead to brain damage and can sometimes be life-threatening. If a person gets medical treatment right away, there is a better chance of surviving and recovering from a stroke. What can increase my risk? The following medical conditions may increase your risk of a stroke: Cardiovascular disease. High blood pressure  (hypertension). Diabetes. High cholesterol. Sickle cell disease. Blood clotting disorders (hypercoagulable state). Obesity. Sleep disorders (obstructive sleep apnea). Other risk factors include: Being older than age 79. Having a history of blood clots, stroke, or mini-stroke (transient ischemic attack, TIA). Genetic factors, such as race, ethnicity, or a family history of stroke. Smoking cigarettes or using other tobacco products. Taking birth control pills, especially if you also use tobacco. Heavy use of alcohol or drugs, especially cocaine and methamphetamine. Physical inactivity. What actions can I take to prevent this? Manage your health conditions High cholesterol levels. Eating a healthy diet is important for preventing high cholesterol. If cholesterol cannot be managed through diet alone, you may need to take medicines. Take any prescribed medicines to control your cholesterol as told by your health care provider. Hypertension. To reduce your risk of stroke, try to keep your blood pressure below 130/80. Eating a healthy diet and exercising regularly are important for controlling blood pressure. If these steps are not enough to manage your blood pressure, you may need to take medicines. Take any prescribed medicines to control hypertension as told by your health care provider. Ask your health care provider if you should monitor your blood pressure at home. Have your blood pressure checked every year, even if your blood pressure is normal. Blood pressure increases with age and some medical conditions. Diabetes. Eating a healthy diet and exercising regularly are important parts of managing your blood sugar (glucose). If your blood sugar cannot be managed through diet and exercise, you may need to take medicines. Take any prescribed medicines to control your diabetes as told by your health care provider. Get evaluated for obstructive sleep apnea. Talk to your health care provider  about  getting a sleep evaluation if you snore a lot or have excessive sleepiness. Make sure that any other medical conditions you have, such as atrial fibrillation or atherosclerosis, are managed. Nutrition Follow instructions from your health care provider about what to eat or drink to help manage your health condition. These instructions may include: Reducing your daily calorie intake. Limiting how much salt (sodium) you use to 1,500 milligrams (mg) each day. Using only healthy fats for cooking, such as olive oil, canola oil, or sunflower oil. Eating healthy foods. You can do this by: Choosing foods that are high in fiber, such as whole grains, and fresh fruits and vegetables. Eating at least 5 servings of fruits and vegetables a day. Try to fill one-half of your plate with fruits and vegetables at each meal. Choosing lean protein foods, such as lean cuts of meat, poultry without skin, fish, tofu, beans, and nuts. Eating low-fat dairy products. Avoiding foods that are high in sodium. This can help lower blood pressure. Avoiding foods that have saturated fat, trans fat, and cholesterol. This can help prevent high cholesterol. Avoiding processed and prepared foods. Counting your daily carbohydrate intake.  Lifestyle If you drink alcohol: Limit how much you have to: 0-1 drink a day for women who are not pregnant. 0-2 drinks a day for men. Know how much alcohol is in your drink. In the [place] one drink equals one 12 oz bottle of beer ( ), one 5 oz glass of wine ( ), or one 1 oz glass of hard liquor (44mL). Do not use any products that contain nicotine or tobacco. These products include cigarettes, chewing tobacco, and vaping devices, such as e-cigarettes. If you need help quitting, ask your health care provider. Avoid secondhand smoke. Do not use drugs. Activity  Try to stay at a healthy weight. Get at least 30 minutes of exercise on most days, such as: Fast  walking. Biking. Swimming. Medicines Take over-the-counter and prescription medicines only as told by your health care provider. Aspirin  or blood thinners (antiplatelets or anticoagulants) may be recommended to reduce your risk of forming blood clots that can lead to stroke. Avoid taking birth control pills. Talk to your health care provider about the risks of taking birth control pills if: You are over 67 years old. You smoke. You get very bad headaches. You have had a blood clot. Where to find more information American Stroke Association: www.strokeassociation.org Get help right away if: You or a loved one has any symptoms of a stroke. BE FAST is an easy way to remember the main warning signs of a stroke: B - Balance. Signs are dizziness, sudden trouble walking, or loss of balance. E - Eyes. Signs are trouble seeing or a sudden change in vision. F - Face. Signs are sudden weakness or numbness of the face, or the face or eyelid drooping on one side. A - Arms. Signs are weakness or numbness in an arm. This happens suddenly and usually on one side of the body. S - Speech. Signs are sudden trouble speaking, slurred speech, or trouble understanding what people say. T - Time. Time to call emergency services. Write down what time symptoms started. You or a loved one has other signs of a stroke, such as: A sudden, severe headache with no known cause. Nausea or vomiting. Seizure. These symptoms may represent a serious problem that is an emergency. Do not wait to see if the symptoms will go away. Get medical help right away. Call your local emergency services (911  in the [place] Do not drive yourself to the hospital. Summary You can help to prevent a stroke by eating healthy, exercising, not smoking, limiting alcohol intake, and managing any medical conditions you may have. Do not use any products that contain nicotine or tobacco. These include cigarettes, chewing tobacco, and vaping devices,  such as e-cigarettes. If you need help quitting, ask your health care provider. Remember BE FAST for warning signs of a stroke. Get help right away if you or a loved one has any of these signs. This information is not intended to replace advice given to you by your health care provider. Make sure you discuss any questions you have with your health care provider. Document Revised: 08/16/2022 Document Reviewed: 08/16/2022 Elsevier Patient Education  2024 [employer].

## 2024-04-04 NOTE — Progress Notes (Signed)
 [facility] [address] [place]. [place] [address] [phone]       OFFICE CONSULT NOTE  [name] Date of Birth:  [birth year] Medical Record Number:  [id number]   Referring MD:  Wolm Scarlet  Reason for Referral: Stroke  HPI: [name] is a pleasant 68 year old lady seen today for initial office consultation visit for stroke.  She is accompanied by her nurse friend Clancy.  History is obtained from them and review of electronic medical records and I personally reviewed pertinent available imaging films in PACS.  She has past medical history of hypertension and hypothyroidism as well as left temporal meningioma s/p craniotomy and excision in May 2022 by Dr. Lanis.  Patient was visiting [facility] [place]  when on 03/18/2024 she presented with sudden onset of headache and left upper and lower extremity weakness and numbness which was going off and on.  MRI scan of the brain showed a punctate right PCA territory infarct as well as encephalomalacia in the left temporal region from prior craniotomy.  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  LDL cholesterol 133 mg percent.  Hemoglobin A1c of 6.1.  Transthoracic echo showed normal ejection fraction.  Patient was discharged on aspirin  and Plavix for 3 weeks and aspirin  alone.  She states she has done well since then.  Numbness has resolved completely.  She is currently wearing a Zio patch to look for paroxysmal A-fib.  Blood pressure is under good control on Norvasc .  She is tolerating Lipitor well without any side effects.  She has no known prior history of strokes TIA seizures.  She actually did have an MRI scan of the brain done on 02/29/24 which was negative for recurrent meningioma but did show right thalamic lacunar infarct which was not reported during June 28.  Patient denies any prior history of strokes TIA seizures.. ROS:   14  system review of systems is positive for numbness, headache, bruising all other systems negative  PMH:  Past Medical History:  Diagnosis Date   Chicken pox    Hypertension    Hyperthyroidism    25 years ago    Social History:  Social History   Socioeconomic History   Marital status: Unknown    Spouse name: Not on file   Number of children: Not on file   Years of education: Not on file   Highest education level: Not on file  Occupational History   Not on file  Tobacco Use   Smoking status: Never   Smokeless tobacco: Never  Vaping Use   Vaping status: Never Used  Substance and Sexual Activity   Alcohol use: No   Drug use: No   Sexual activity: Not on file  Other Topics Concern   Not on file  Social History Narrative   Not on file   Social Drivers of Health   Financial Resource Strain: Low Risk  (01/30/2024)   Overall Financial Resource Strain (CARDIA)    Difficulty of Paying Living Expenses: Not hard at all  Food Insecurity: No Food Insecurity (01/30/2024)   Hunger Vital Sign    Worried About Running Out of Food in the Last Year: Never true    Ran Out of Food in the Last Year: Never true  Transportation Needs: No Transportation Needs (01/30/2024)   PRAPARE - Administrator, Civil Service (Medical): No    Lack of Transportation (Non-Medical): No  Physical Activity: Sufficiently Active (01/30/2024)   Exercise Vital Sign    Days of Exercise per Week: 5 days    Minutes of Exercise per Session: 60 min  Stress: No Stress Concern Present (01/30/2024)   [employer] of Occupational Health - Occupational Stress Questionnaire    Feeling of Stress : Not at all  Social Connections: Socially Integrated (01/30/2024)   Social Connection and Isolation Panel    Frequency of Communication with Friends and Family: More than three times a week    Frequency of Social Gatherings with Friends and Family: More than three times a week    Attends Religious Services: More than 4  times per year    Active Member of [employer] or Organizations: Yes    Attends Engineer, structural: More than 4 times per year    Marital Status: Married  Catering manager Violence: Not At Risk (01/30/2024)   Humiliation, Afraid, Rape, and Kick questionnaire    Fear of Current or Ex-Partner: No    Emotionally Abused: No    Physically Abused: No    Sexually Abused: No    Medications:   Current Outpatient Medications on File Prior to Visit  Medication Sig Dispense Refill   alendronate (FOSAMAX) 70 MG tablet TAKE 1 TABLET BY MOUTH ONE TIME PER WEEK 12 tablet 1   amLODipine  (NORVASC ) 10 MG tablet Take 1 tablet (10 mg total) by mouth daily. 90 tablet 3   aspirin  EC 81 MG tablet Take 81 mg by mouth daily.     atorvastatin  (LIPITOR) 40 MG tablet Take 1 tablet (40 mg total) by mouth daily. 90 tablet 3   triamcinolone  cream (KENALOG ) 0.1 % Apply 1 Application topically 2 (two) times daily as needed. 30 g 2   Vitamin D , Ergocalciferol , (DRISDOL ) 50000 units CAPS capsule TAKE 1 CAPSULE (50,000 UNITS TOTAL) BY MOUTH EVERY 7 (SEVEN) DAYS. 4 capsule 0   No current facility-administered medications on file prior to visit.    Allergies:   Allergies  Allergen Reactions   Corn-Containing Products Dermatitis    Physical Exam General: well developed, well nourished pleasant middle-aged lady, seated, in no evident distress Head: head normocephalic and atraumatic.   Neck: supple with no carotid or supraclavicular bruits Cardiovascular: regular rate and rhythm, no murmurs Musculoskeletal: no deformity Skin:  no rash/petichiae Vascular:  Normal pulses all extremities  Neurologic Exam Mental Status: Awake and fully alert. Oriented to place and time. Recent and remote memory intact. Attention span, concentration and fund of knowledge appropriate. Mood and affect appropriate.  Cranial Nerves: Fundoscopic exam reveals sharp disc margins. Pupils equal, briskly reactive to light. Extraocular movements  full without nystagmus. Visual fields full to confrontation. Hearing intact. Facial sensation intact. Face, tongue, palate moves normally and symmetrically.  Motor: Normal bulk and tone. Normal strength in all tested extremity muscles. Sensory.: intact to touch , pinprick , position and vibratory sensation.  Coordination: Rapid alternating movements normal in all extremities. Finger-to-nose and heel-to-shin performed accurately bilaterally. Gait and Station: Arises from chair without difficulty. Stance is normal. Gait demonstrates normal stride length and balance . Able to heel, toe and tandem walk without difficulty.  Reflexes: 1+ and symmetric. Toes downgoing.   NIHSS  0 Modified Rankin  0   ASSESSMENT: 68 year old lady with subacute right PCA branch infarcts of cryptogenic etiology in June 2025.  She is doing very well with no residual deficits.  Vascular risk factors of hypertension and hyperlipidemia only.     PLAN:I had a  long d/w patient and her friend about her recent subacute right posterior cerebral artery branch small embolic strokes,, risk for recurrent stroke/TIAs, personally independently reviewed imaging studies and stroke evaluation results and answered questions.Continue aspirin  81 mg daily  for secondary stroke prevention and maintain strict control of hypertension with blood pressure goal below 140/90, diabetes with hemoglobin A1c goal below 6.5% and lipids with LDL cholesterol goal below 70 mg/dL. I also advised the patient to eat a healthy diet with plenty of whole grains, cereals, fruits and vegetables, exercise regularly and maintain ideal body weight .Check TEE for cardiac source of embolism.  Continue ongoing 30-day heart monitor for paroxysmal A-fib and check transcranial Doppler bubble study for PFO.  She was advised to return to work and increase her physical activity and exercise gradually as tolerated.  Followup in the future with me in 6 months or call earlier if  necessary.\ Greater than 50% time during this 45-minute consultation visit was spent in counseling and coordination of care about her cryptogenic stroke and discussion about stroke evaluation and prevention and treatment and answering questions. Eather Popp, MD Note: This document was prepared with digital dictation and possible smart phrase technology. Any transcriptional errors that result from this process are unintentional.

## 2024-04-04 NOTE — Telephone Encounter (Signed)
 We can't order Echo TEE I am cancelling the order. The referral to [facility] was sent and she is also scheduled in August at [facility] they can order this there.

## 2024-04-05 ENCOUNTER — Ambulatory Visit: Payer: Self-pay | Admitting: Neurology

## 2024-04-27 ENCOUNTER — Ambulatory Visit (HOSPITAL_COMMUNITY)
Admission: RE | Admit: 2024-04-27 | Discharge: 2024-04-27 | Disposition: A | Discharge status: Home / Self Care | Source: Ambulatory Visit | Attending: Neurology | Admitting: Neurology

## 2024-04-27 DIAGNOSIS — Z09 Encounter for follow-up examination after completed treatment for conditions other than malignant neoplasm: Secondary | ICD-10-CM | POA: Diagnosis not present

## 2024-04-27 DIAGNOSIS — Z8673 Personal history of transient ischemic attack (TIA), and cerebral infarction without residual deficits: Secondary | ICD-10-CM | POA: Diagnosis not present

## 2024-04-27 DIAGNOSIS — I639 Cerebral infarction, unspecified: Secondary | ICD-10-CM | POA: Diagnosis not present

## 2024-05-03 ENCOUNTER — Ambulatory Visit: Payer: Self-pay | Admitting: Neurology

## 2024-05-03 NOTE — Progress Notes (Signed)
 The results of this negative TCD bubble study test were communicated to the patient at the time of the study.

## 2024-05-03 NOTE — H&P (View-Only) (Signed)
 The results of this negative TCD bubble study test were communicated to the patient at the time of the study.

## 2024-05-12 ENCOUNTER — Other Ambulatory Visit: Payer: Self-pay | Admitting: Family Medicine

## 2024-05-15 ENCOUNTER — Telehealth: Payer: Self-pay | Admitting: *Deleted

## 2024-05-15 NOTE — Telephone Encounter (Signed)
 Lmovm to verify card hx.

## 2024-05-19 NOTE — Progress Notes (Unsigned)
 Cardiology Office Note  Date:  05/21/2024   ID:  Barbara Kim, DOB [birth year], MRN [id number]  PCP:  Barbara Kim ORN, MD   Chief Complaint  Patient presents with   New Patient (Initial Visit)    Ref by Dr. Wolm Barbara for actue CVA. Patient c/o chest pressure about 1 month ago before the CVA.     HPI:  Barbara Kim a 68 y.o. female with past medical history of: Essential hypertension Hypothyroidism left temporal meningioma s/p craniotomy and excision in May 2022  Acute stroke Hyperlipidemia PAD, carotid calcification Who presents by referral from Dr. Wolm Barbara for acute CVA, hyperlipidemia  visiting [facility] [place]  when on 03/18/2024  Developed waxing waning symptoms of headache and left upper and lower extremity weakness and numbness  Seen in the hospital  MRI scan of the brain showed a punctate right PCA territory infarct as well as encephalomalacia in the left temporal region from prior craniotomy.  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  LDL cholesterol 133 mg percent.  Hemoglobin A1c of 6.1.  Transthoracic echo showed normal ejection fraction. Patient was discharged on aspirin  and Plavix for 3 weeks and aspirin  alone.   She reports having 2-week monitor through hospital in [facility], results not available for review Neurology has indicated they prefer 1 month of monitoring and have requested TEE  MRI scan of the brain done on 02/29/24 which was negative for recurrent meningioma but did show right thalamic lacunar infarct   No significant ICA stenosis.  Transcranial Doppler negative for PFO  EKG personally reviewed by myself on todays visit EKG Interpretation Date/Time:  Monday May 21 2024 09:41:04 EDT Ventricular Rate:  54 PR Interval:  202 QRS Duration:  88 QT Interval:  430 QTC Calculation: 407 R Axis:   25  Text Interpretation: Sinus bradycardia When  compared with ECG of 17-Feb-2021 08:43, No significant change was found Confirmed by Barbara Kim [phone]) on 05/21/2024 9:43:49 AM    PMH:   has a past medical history of Chicken pox, Hypertension, and Hyperthyroidism.  PSH:    Past Surgical History:  Procedure Laterality Date   APPLICATION OF CRANIAL NAVIGATION Left 02/20/2021   Procedure: APPLICATION OF CRANIAL NAVIGATION;  Surgeon: Barbara Pupa, MD;  Location: [facility] OR;  Service: Neurosurgery;  Laterality: Left;   CRANIOTOMY Left 02/20/2021   Procedure: Stereotactic LEFT pterional craniotomy for resection of meningioma;  Surgeon: Barbara Pupa, MD;  Location: [facility] OR;  Service: Neurosurgery;  Laterality: Left;   TONSILLECTOMY  1983    Current Outpatient Medications  Medication Sig Dispense Refill   alendronate (FOSAMAX) 70 MG tablet TAKE 1 TABLET BY MOUTH ONE TIME PER WEEK 12 tablet 1   amLODipine  (NORVASC ) 10 MG tablet Take 1 tablet (10 mg total) by mouth daily. 90 tablet 3   aspirin  EC 81 MG tablet Take 81 mg by mouth daily.     atorvastatin  (LIPITOR) 40 MG tablet Take 1 tablet (40 mg total) by mouth daily. 90 tablet 3   triamcinolone  cream (KENALOG ) 0.1 % Apply 1 Application topically 2 (two) times daily as needed. 30 g 2   Vitamin D , Ergocalciferol , (DRISDOL ) 50000 units CAPS capsule TAKE 1 CAPSULE (50,000 UNITS TOTAL) BY MOUTH EVERY 7 (SEVEN) DAYS. 4 capsule 0   No current facility-administered medications for this visit.    Allergies:   Corn-containing products   Social History:  The patient  reports that she has  never smoked. She has never used smokeless tobacco. She reports that she does not drink alcohol and does not use drugs.   Family History:   family history is not on file. She was adopted.    Review of Systems: Review of Systems  Constitutional: Negative.   HENT: Negative.    Respiratory: Negative.    Cardiovascular: Negative.   Gastrointestinal: Negative.   Musculoskeletal: Negative.   Neurological:  Negative.   Psychiatric/Behavioral: Negative.    All other systems reviewed and are negative.   PHYSICAL EXAM: VS:  BP 130/60 (BP Location: Right Arm, Patient Position: Sitting, Cuff Size: Normal)   Pulse (!) 54   Ht 4' 8 (1.422 m)   Wt 120 lb (54.4 kg)   SpO2 98%   BMI 26.90 kg/m  , BMI Body mass index is 26.9 kg/m. GEN: Well nourished, well developed, in no acute distress HEENT: normal Neck: no JVD, carotid bruits, or masses Cardiac: RRR; no murmurs, rubs, or gallops, trace bilateral lower extremity edema  Respiratory:  clear to auscultation bilaterally, normal work of breathing GI: soft, nontender, nondistended, + BS MS: no deformity or atrophy Skin: warm and dry, no rash Neuro:  Strength and sensation are intact Psych: euthymic mood, full affect  Recent Labs: No results found for requested labs within last 365 days.    Lipid Panel Lab Results  Component Value Date   CHOL 235 (H) 05/02/2023   HDL 68.00 05/02/2023   LDLCALC 150 (H) 05/02/2023   TRIG 86.0 05/02/2023     Wt Readings from Last 3 Encounters:  05/21/24 120 lb (54.4 kg)  04/04/24 120 lb (54.4 kg)  03/23/24 119 lb 6.4 oz (54.2 kg)     ASSESSMENT AND PLAN:  Problem List Items Addressed This Visit       Cardiology Problems   Essential hypertension, benign   Relevant Orders   EKG 12-Lead (Completed)     Other   Brain tumor (HCC)   Other Visit Diagnoses       Acute CVA (cerebrovascular accident) (HCC)    -  Primary   Relevant Orders   EKG 12-Lead (Completed)      Acute CVA In the hospital in [facility] [place] , extensive workup completed, reviewed on today's visit By neurology, transcranial Dopplers negative Request for 1 month of monitoring and TEE Discussed results to date, recommended additional 2-week Zio monitor also suggested considering buying Apple Watch for further arrhythmia monitoring -We will schedule TEE at her convenience, procedure discussed in detail including risk and  benefit - Currently on aspirin , statin  PAD, carotid plaque Noted on imaging less than 50% On Lipitor 40 daily, goal LDL less than 70, preferably less than 55 May need to add Zetia if numbers above goal in follow-up  Hyperlipidemia As above continue Lipitor 40, may need to add Zetia to achieve goal LDL less than 55  Essential hypertension Blood pressure reasonable range on home numbers on amlodipine  10 daily Trace lower extremity edema, not bothered by this If lower extremity swelling gets worse may need to wean down on the amlodipine  and change to alternate medication   Signed, Velinda Lunger, M.D., Ph.D. [facility] [place], [place] [phone]

## 2024-05-21 ENCOUNTER — Ambulatory Visit

## 2024-05-21 ENCOUNTER — Ambulatory Visit: Attending: Cardiovascular Disease | Admitting: Cardiovascular Disease

## 2024-05-21 ENCOUNTER — Encounter: Payer: Self-pay | Admitting: Cardiovascular Disease

## 2024-05-21 VITALS — BP 130/60 | HR 54 | Ht <= 58 in | Wt 120.0 lb

## 2024-05-21 DIAGNOSIS — I10 Essential (primary) hypertension: Secondary | ICD-10-CM | POA: Diagnosis not present

## 2024-05-21 DIAGNOSIS — D496 Neoplasm of unspecified behavior of brain: Secondary | ICD-10-CM

## 2024-05-21 DIAGNOSIS — I639 Cerebral infarction, unspecified: Secondary | ICD-10-CM

## 2024-05-21 NOTE — Patient Instructions (Signed)
 Medication Instructions:  No changes  If you need a refill on your cardiac medications before your next appointment, please call your pharmacy.   Lab work: Your provider would like for you to have following labs drawn today CBC and BMP.    Testing/Procedures:  Your physician has recommended that you wear a Zio monitor.   This monitor is a medical device that records the heart's electrical activity. Doctors most often use these monitors to diagnose arrhythmias. Arrhythmias are problems with the speed or rhythm of the heartbeat. The monitor is a small device applied to your chest. You can wear one while you do your normal daily activities. While wearing this monitor if you have any symptoms to push the button and record what you felt. Once you have worn this monitor for the period of time provider prescribed (Usually 14 days), you will return the monitor device in the postage paid box. Once it is returned they will download the data collected and provide us  with a report which the provider will then review and we will call you with those results. Important tips:  Avoid showering during the first 24 hours of wearing the monitor. Avoid excessive sweating to help maximize wear time. Do not submerge the device, no hot tubs, and no swimming pools. Keep any lotions or oils away from the patch. After 24 hours you may shower with the patch on. Take brief showers with your back facing the shower head.  Do not remove patch once it has been placed because that will interrupt data and decrease adhesive wear time. Push the button when you have any symptoms and write down what you were feeling. Once you have completed wearing your monitor, remove and place into box which has postage paid and place in your outgoing mailbox.  If for some reason you have misplaced your box then call our office and we can provide another box and/or mail it off for you.       Dear Barbara Kim  You are scheduled for a TEE  (Transesophageal Echocardiogram) on Wednesday, August 27 with Dr. Gollan.  Please arrive at the [facility] Entrance of [facility], [address], [place] [address] at 7:00 AM (This is 1 hour(s) prior to your procedure time).  Proceed to the Check-In Desk directly inside the entrance.  Procedure Parking: Use the entrance off of the [address] side of the hospital. Turn right upon entering and follow the driveway to parking that is directly in front of the [facility] Center. There is no valet parking available at this entrance, however there is an awning directly in front of the [facility]ascular Center for drop off/ pick up for patients.    DIET:  Nothing to eat or drink after midnight except a sip of water with medications (see medication instructions below)  MEDICATION INSTRUCTIONS: !!IF ANY NEW MEDICATIONS ARE STARTED AFTER TODAY, PLEASE NOTIFY YOUR PROVIDER AS SOON AS POSSIBLE!!  FYI: Medications such as Semaglutide (Ozempic, [place]), Tirzepatide (Mounjaro, Zepbound), Dulaglutide (Trulicity), etc (GLP1 agonists) AND Canagliflozin (Invokana), Dapagliflozin (Farxiga), Empagliflozin (Jardiance), Ertugliflozin (Steglatro), Bexagliflozin [employer]) or any combination with one of these drugs such as Invokamet (Canagliflozin/Metformin), Synjardy (Empagliflozin/Metformin), etc (SGLT2 inhibitors) must be held around the time of a procedure. This is not a comprehensive list of all of these drugs. Please review all of your medications and talk to your provider if you take any one of these. If you are not sure, ask your provider.  FYI:  For your safety, and to allow us  to monitor your vital signs accurately during the surgery/procedure we request: If you have artificial nails, gel coating, SNS etc, please have those removed prior to your surgery/procedure. Not having the nail coverings /polish removed may result in cancellation or delay of your surgery/procedure.  You must have a  responsible person to drive you home and stay in the waiting area during your procedure. Failure to do so could result in cancellation.  Bring your insurance cards.  *Special Note: Every effort is made to have your procedure done on time. Occasionally there are emergencies that occur at the hospital that may cause delays. Please be patient if a delay does occur.       Follow-Up: At [facility], you and your health needs are our priority.  As part of our continuing mission to provide you with exceptional heart care, we have created designated Provider Care Teams.  These Care Teams include your primary Cardiologist (physician) and Advanced Practice Providers (APPs -  Physician Assistants and Nurse Practitioners) who all work together to provide you with the care you need, when you need it.  You will need a follow up appointment as needed  Providers on your designated Care Team:   Lonni Meager, NP Bernardino Bring, PA-C Cadence [place], [place]  COVID-19 Vaccine Information can be found at: [email or web address] For questions related to vaccine distribution or appointments, please email [email or web address] or call [phone].

## 2024-05-22 ENCOUNTER — Telehealth: Payer: Self-pay | Admitting: *Deleted

## 2024-05-22 ENCOUNTER — Other Ambulatory Visit: Payer: Self-pay | Admitting: Cardiovascular Disease

## 2024-05-22 ENCOUNTER — Ambulatory Visit: Payer: Self-pay | Admitting: Cardiovascular Disease

## 2024-05-22 DIAGNOSIS — I639 Cerebral infarction, unspecified: Secondary | ICD-10-CM

## 2024-05-22 LAB — CBC
Hematocrit: 44.6 % (ref 34.0–46.6)
Hemoglobin: 14.4 g/dL (ref 11.1–15.9)
MCH: 30.4 pg (ref 26.6–33.0)
MCHC: 32.3 g/dL (ref 31.5–35.7)
MCV: 94 fL (ref 79–97)
Platelets: 179 x10E3/uL (ref 150–450)
RBC: 4.74 x10E6/uL (ref 3.77–5.28)
RDW: 13.1 % (ref 11.7–15.4)
WBC: 6.6 x10E3/uL (ref 3.4–10.8)

## 2024-05-22 LAB — BASIC METABOLIC PANEL WITH GFR
BUN/Creatinine Ratio: 26 (ref 12–28)
BUN: 18 mg/dL (ref 8–27)
CO2: 22 mmol/L (ref 20–29)
Calcium: 9.5 mg/dL (ref 8.7–10.3)
Chloride: 102 mmol/L (ref 96–106)
Creatinine, Ser: 0.68 mg/dL (ref 0.57–1.00)
Glucose: 92 mg/dL (ref 70–99)
Potassium: 3.9 mmol/L (ref 3.5–5.2)
Sodium: 141 mmol/L (ref 134–144)
eGFR: 95 mL/min/1.73 (ref >59)

## 2024-05-22 NOTE — Telephone Encounter (Signed)
 Called to confirm/remind patient of their procedure.   Scheduled for: TEE  [x]  Date 05/22/24  [x]  Time 08:00 am [x]  Arrival time 07:00 am [x]  Location [facility]   [x]  Designated Driver  [x]  Instructions, time, and location reviewed with patient    [x]  H&P within 30 days  [x]  EKG within 30 days  [x]  Orders  [x]  Labs  [x]  Diet  [x]  Medication instructions    [x]  Left detailed voicemail message with instructions and arrival time for her procedure tomorrow.

## 2024-05-23 ENCOUNTER — Encounter
Admission: RE | Disposition: A | Discharge status: Home / Self Care | Payer: Self-pay | Source: Home / Self Care | Attending: Cardiovascular Disease

## 2024-05-23 ENCOUNTER — Ambulatory Visit (HOSPITAL_BASED_OUTPATIENT_CLINIC_OR_DEPARTMENT_OTHER)
Admission: RE | Admit: 2024-05-23 | Discharge: 2024-05-23 | Disposition: A | Discharge status: Home / Self Care | Source: Home / Self Care | Attending: Cardiovascular Disease | Admitting: Cardiovascular Disease

## 2024-05-23 ENCOUNTER — Ambulatory Visit: Admitting: Family Medicine

## 2024-05-23 ENCOUNTER — Encounter: Payer: Self-pay | Admitting: Cardiovascular Disease

## 2024-05-23 ENCOUNTER — Ambulatory Visit
Admission: RE | Admit: 2024-05-23 | Discharge: 2024-05-23 | Disposition: A | Discharge status: Home / Self Care | Attending: Cardiovascular Disease | Admitting: Cardiovascular Disease

## 2024-05-23 ENCOUNTER — Other Ambulatory Visit: Payer: Self-pay | Admitting: Family Medicine

## 2024-05-23 DIAGNOSIS — I639 Cerebral infarction, unspecified: Secondary | ICD-10-CM | POA: Diagnosis not present

## 2024-05-23 DIAGNOSIS — I340 Nonrheumatic mitral (valve) insufficiency: Secondary | ICD-10-CM | POA: Insufficient documentation

## 2024-05-23 DIAGNOSIS — I700 Atherosclerosis of aorta: Secondary | ICD-10-CM | POA: Diagnosis not present

## 2024-05-23 HISTORY — PX: TEE WITHOUT CARDIOVERSION: SHX5443

## 2024-05-23 LAB — ECHO TEE

## 2024-05-23 SURGERY — ECHOCARDIOGRAM, TRANSESOPHAGEAL
Anesthesia: Moderate Sedation

## 2024-05-23 MED ORDER — LIDOCAINE VISCOUS HCL 2 % MT SOLN
OROMUCOSAL | Status: AC
  Administered 2024-05-23: 10 mL
  Filled 2024-05-23: qty 15

## 2024-05-23 MED ORDER — LIDOCAINE VISCOUS HCL 2 % MT SOLN
OROMUCOSAL | Status: AC
  Filled 2024-05-23: qty 15

## 2024-05-23 MED ORDER — FENTANYL CITRATE (PF) 100 MCG/2ML IJ SOLN
INTRAMUSCULAR | Status: AC
  Filled 2024-05-23: qty 2

## 2024-05-23 MED ORDER — MIDAZOLAM HCL 2 MG/2ML IJ SOLN
INTRAMUSCULAR | Status: AC
  Filled 2024-05-23: qty 4

## 2024-05-23 MED ORDER — FENTANYL CITRATE (PF) 100 MCG/2ML IJ SOLN
INTRAMUSCULAR | Status: AC | PRN
  Administered 2024-05-23 (×2): 50 ug via INTRAVENOUS

## 2024-05-23 MED ORDER — MIDAZOLAM HCL 2 MG/2ML IJ SOLN
INTRAMUSCULAR | Status: AC | PRN
Start: 2024-05-23 — End: 2024-05-23
  Administered 2024-05-23 (×2): 2 mg via INTRAVENOUS

## 2024-05-23 MED ORDER — MIDAZOLAM HCL 2 MG/2ML IJ SOLN
INTRAMUSCULAR | Status: DC
Start: 2024-05-23 — End: 2024-05-23
  Filled 2024-05-23: qty 2

## 2024-05-23 MED ORDER — BUTAMBEN-TETRACAINE-BENZOCAINE 2-2-14 % EX AERO
INHALATION_SPRAY | CUTANEOUS | Status: AC
  Filled 2024-05-23: qty 5

## 2024-05-23 MED ORDER — SODIUM CHLORIDE 0.9 % IV SOLN: INTRAVENOUS | Status: DC

## 2024-05-23 MED ORDER — FENTANYL CITRATE (PF) 100 MCG/2ML IJ SOLN
INTRAMUSCULAR | Status: AC
Start: 2024-05-23 — End: 2024-05-23
  Filled 2024-05-23: qty 2

## 2024-05-23 NOTE — Progress Notes (Signed)
 Transesophageal Echocardiogram :  Indication: Acute stroke Requesting/ordering  physician: Dr. Marya  Procedure: Benzocaine  spray x2 and 2 mls x 2 of viscous lidocaine  were given orally to provide local anesthesia to the oropharynx. The patient was positioned supine on the left side, bite block provided. The patient was moderately sedated with the doses of versed  and fentanyl  as detailed below.  Using digital technique an omniplane probe was advanced into the distal esophagus without incident.   Moderate sedation: 1. Sedation used:  Versed : 1 mg IV, Fentanyl : 100 mcg IV 2. Time administered: 8 AM   time when patient started recovery: 8:30 AM Total sedation time 30 minutes 3. I was face to face during this time  See report in EPIC  for complete details: 1. Left ventricular ejection fraction, by estimation, is 60 to 65%. The  left ventricle has normal function. The left ventricle has no regional wall motion abnormalities.   2. Right ventricular systolic function is normal. The right ventricular size is normal.   3. No left atrial/left atrial appendage thrombus was detected.   4. The mitral valve is normal in structure. Mild mitral valve  regurgitation. No evidence of mitral stenosis.   5. The aortic valve is normal in structure. Aortic valve regurgitation is not visualized. No aortic stenosis is present.   6. There is Moderate (Grade III) atheroma plaque involving the aortic arch and descending aorta.   7. The inferior vena cava is normal in size with greater than 50% respiratory variability, suggesting right atrial pressure of 3 mmHg.   8. Agitated saline contrast bubble study was negative, with no evidence of any interatrial shunt.    Barbara Kim 05/23/2024 1:54 PM

## 2024-05-23 NOTE — Telephone Encounter (Signed)
 Copied from CRM [id number]. Topic: Clinical - Medication Refill >> May 23, 2024 10:18 AM Larissa S wrote: Medication: atorvastatin  (LIPITOR) 40 MG tablet amLODipine  (NORVASC ) 10 MG tablet  Has the patient contacted their pharmacy? Yes (Agent: If no, request that the patient contact the pharmacy for the refill. If patient does not wish to contact the pharmacy document the reason why and proceed with request.) (Agent: If yes, when and what did the pharmacy advise?)  This is the patient's preferred pharmacy:  [id number] [place], [place] - [address] [place] [address] Phone: [phone] Fax: [phone]  Is this the correct pharmacy for this prescription? Yes If no, delete pharmacy and type the correct one.   Has the prescription been filled recently? No  Is the patient out of the medication? Yes  Has the patient been seen for an appointment in the last year OR does the patient have an upcoming appointment? Yes  Can we respond through MyChart? No  Agent: Please be advised that Rx refills may take up to 3 business days. We ask that you follow-up with your pharmacy.

## 2024-05-24 ENCOUNTER — Encounter: Payer: Self-pay | Admitting: Cardiovascular Disease

## 2024-05-25 ENCOUNTER — Encounter: Payer: Self-pay | Admitting: Family Medicine

## 2024-05-25 ENCOUNTER — Ambulatory Visit (INDEPENDENT_AMBULATORY_CARE_PROVIDER_SITE_OTHER): Admitting: Family Medicine

## 2024-05-25 VITALS — BP 140/60 | HR 67 | Temp 97.9°F | Wt 119.1 lb

## 2024-05-25 DIAGNOSIS — I10 Essential (primary) hypertension: Secondary | ICD-10-CM

## 2024-05-25 DIAGNOSIS — E785 Hyperlipidemia, unspecified: Secondary | ICD-10-CM

## 2024-05-25 DIAGNOSIS — R7303 Prediabetes: Secondary | ICD-10-CM | POA: Diagnosis not present

## 2024-05-25 DIAGNOSIS — Z8673 Personal history of transient ischemic attack (TIA), and cerebral infarction without residual deficits: Secondary | ICD-10-CM | POA: Diagnosis not present

## 2024-05-25 MED ORDER — ATORVASTATIN CALCIUM 40 MG PO TABS: 40.0000 mg | ORAL_TABLET | Freq: Every day | ORAL | 3 refills | Status: AC

## 2024-05-25 MED ORDER — TELMISARTAN 20 MG PO TABS: 20.0000 mg | ORAL_TABLET | Freq: Every day | ORAL | 3 refills | Status: AC

## 2024-05-25 NOTE — Progress Notes (Signed)
 Established Patient Office Visit  Subjective   Patient ID: Barbara Kim, female    DOB: 11-Jan-1956  Age: 68 y.o. MRN: [id number]  Chief Complaint  Patient presents with   Medical Management of Chronic Issues    HPI   Barbara Kim is seen following recent stroke.  She was down in [facility]  and was admitted there with subacute CVA right thalamus and posterior temporal and occipital lobes.  She had fairly extensive workup there with no evidence for significant ICA stenosis.  She reportedly had a cardiac monitor and we have not been able to track down those results.  Echocardiogram unremarkable.  No known history of A-fib.  LDL cholesterol prior to stroke 133.  Patient now on atorvastatin  and needs follow-up labs.  She does have hypertension which is treated with amlodipine .  She is very health-conscious and exercises regularly.  Recent A1c 6.1%.  She has seen both neurologist and cardiologist since coming back here from her stroke.  She had recent TEE per cardiologist here with no evidence for PFO and no major valve issues.  She was ordered Zio patch but has not yet placed this.  She is taking Lipitor but apparently misplaced her bottle recently and reportedly took her husband's 20 mg.  She is currently back to exercising regularly but is trying to avoid heavy lifting and working more on frequent reps with smaller amounts of weight.  Past Medical History:  Diagnosis Date   Chicken pox    Hypertension    Hyperthyroidism    25 years ago   Stroke [facility]) 2025   May   Past Surgical History:  Procedure Laterality Date   APPLICATION OF CRANIAL NAVIGATION Left 02/20/2021   Procedure: APPLICATION OF CRANIAL NAVIGATION;  Surgeon: Lanis Pupa, MD;  Location: [facility] OR;  Service: Neurosurgery;  Laterality: Left;   CRANIOTOMY Left 02/20/2021   Procedure: Stereotactic LEFT pterional craniotomy for resection of meningioma;  Surgeon: Lanis Pupa, MD;  Location: [facility] OR;  Service:  Neurosurgery;  Laterality: Left;   TEE WITHOUT CARDIOVERSION N/A 05/23/2024   Procedure: ECHOCARDIOGRAM, TRANSESOPHAGEAL;  Surgeon: Perla Evalene PARAS, MD;  Location: [facility] ORS;  Service: Cardiovascular;  Laterality: N/A;   TONSILLECTOMY  1983    reports that she has never smoked. She has never used smokeless tobacco. She reports that she does not drink alcohol and does not use drugs. family history is not on file. She was adopted. Allergies  Allergen Reactions   Corn-Containing Products Dermatitis    Review of Systems  Constitutional:  Negative for malaise/fatigue.  Eyes:  Negative for blurred vision.  Respiratory:  Negative for shortness of breath.   Cardiovascular:  Negative for chest pain.  Neurological:  Negative for dizziness, weakness and headaches.      Objective:     BP (!) 140/60   Pulse 67   Temp 97.9 F (36.6 C) (Oral)   Wt 119 lb 1.6 oz (54 kg)   SpO2 97%   BMI 26.70 kg/m  BP Readings from Last 3 Encounters:  05/25/24 (!) 140/60  05/23/24 (!) 152/72  05/21/24 130/60   Wt Readings from Last 3 Encounters:  05/25/24 119 lb 1.6 oz (54 kg)  05/23/24 119 lb (54 kg)  05/21/24 120 lb (54.4 kg)      Physical Exam Vitals reviewed.  Constitutional:      Appearance: She is well-developed.  Eyes:     Pupils: Pupils are equal, round, and reactive to light.  Neck:     Thyroid :  No thyromegaly.     Vascular: No JVD.  Cardiovascular:     Rate and Rhythm: Normal rate and regular rhythm.     Heart sounds:     No gallop.  Pulmonary:     Effort: Pulmonary effort is normal. No respiratory distress.     Breath sounds: Normal breath sounds. No wheezing or rales.  Musculoskeletal:     Cervical back: Neck supple.  Neurological:     Mental Status: She is alert.      No results found for any visits on 05/25/24.    The ASCVD Risk score (Arnett DK, et al., 2019) failed to calculate for the following reasons:   Risk score cannot be calculated because patient has a  medical history suggesting prior/existing ASCVD    Assessment & Plan:   #1 recent acute CVA.  Patient has seen cardiologist and neurologist.  We discussed importance of secondary prevention with A1c less than 6.5, LDL less than 70, blood pressure consistently less than 130/80.  She has seen cardiologist and had TEE and pending Zio patch Remains on aspirin   #2 hyperlipidemia with LDL recently 133.  Patient on atorvastatin  40 mg daily.  Recheck lipid and CMP today  #3 prediabetes with recent A1c 6.1%.  Continue low glycemic diet and regular exercise.  Consider repeat A1c in about 6 months  #4 hypertension.  Blood pressure rarely below 130/80 but up just slightly today.   Discussed adding Micardis  20 mg once daily to her amlodipine .  She has also had some edema with her amlodipine  10 mg though not severe.  Set up follow-up in about 4 weeks to reassess blood pressure on amlodipine .  Continue low-sodium diet.  Would like to see her blood pressure more consistently below 130/80   Return in about 1 month (around 06/25/2024).    Wolm Scarlet, MD

## 2024-05-28 DIAGNOSIS — R7303 Prediabetes: Secondary | ICD-10-CM | POA: Insufficient documentation

## 2024-05-28 NOTE — Interval H&P Note (Signed)
 History and Physical Interval Note:  05/28/2024 1:15 PM  Barbara Kim  has presented today for surgery, with the diagnosis of TEE   CVA MODERATE SEDATION PER DR Tramya Schoenfelder/ [facility].  The various methods of treatment have been discussed with the patient and family. After consideration of risks, benefits and other options for treatment, the patient has consented to  Procedure(s): ECHOCARDIOGRAM, TRANSESOPHAGEAL (N/A) as a surgical intervention.  The patient's history has been reviewed, patient examined, no change in status, stable for surgery.  I have reviewed the patient's chart and labs.  Questions were answered to the patient's satisfaction.     [name]

## 2024-05-28 NOTE — H&P (Signed)
 Patient was seen and evaluated prior to transesophageal echo procedure Symptoms, prior testing details again confirmed with the patient Patient examined, no significant change from prior exam Lab work reviewed in detail personally by myself After careful review of history and examination, the risks and benefits of transesophageal echocardiogram have been explained including risks of esophageal damage, perforation (1:10,000 risk), bleeding, pharyngeal hematoma as well as other potential complications associated with conscious sedation including aspiration, arrhythmia, respiratory failure and death. Alternatives to treatment were discussed, questions were answered.  Patient is willing to proceed.   Signed, [name], MD, Ph.D [facility]

## 2024-05-29 ENCOUNTER — Ambulatory Visit: Payer: Self-pay | Admitting: Family Medicine

## 2024-05-29 ENCOUNTER — Other Ambulatory Visit (INDEPENDENT_AMBULATORY_CARE_PROVIDER_SITE_OTHER)

## 2024-05-29 DIAGNOSIS — E785 Hyperlipidemia, unspecified: Secondary | ICD-10-CM

## 2024-05-29 LAB — LIPID PANEL
Cholesterol: 148 mg/dL (ref 0–200)
HDL: 59.1 mg/dL (ref >39.00)
LDL Cholesterol: 78 mg/dL (ref 0–99)
NonHDL: 88.71
Total CHOL/HDL Ratio: 3
Triglycerides: 55 mg/dL (ref 0.0–149.0)
VLDL: 11 mg/dL (ref 0.0–40.0)

## 2024-05-29 LAB — HEPATIC FUNCTION PANEL
ALT: 16 U/L (ref 0–35)
AST: 15 U/L (ref 0–37)
Albumin: 4.3 g/dL (ref 3.5–5.2)
Alkaline Phosphatase: 67 U/L (ref 39–117)
Bilirubin, Direct: 0.2 mg/dL (ref 0.0–0.3)
Total Bilirubin: 0.6 mg/dL (ref 0.2–1.2)
Total Protein: 7.6 g/dL (ref 6.0–8.3)

## 2024-05-29 MED ORDER — EZETIMIBE 10 MG PO TABS: 10.0000 mg | ORAL_TABLET | Freq: Every day | ORAL | 0 refills | Status: DC

## 2024-05-29 NOTE — Addendum Note (Signed)
 Addended by: METTA KRISTEN CROME on: 05/29/2024 03:36 PM   Modules accepted: Orders

## 2024-06-07 ENCOUNTER — Telehealth: Payer: Self-pay | Admitting: *Deleted

## 2024-06-07 NOTE — Telephone Encounter (Signed)
 Copied from CRM [id number]. Topic: Clinical - Medication Question >> Jun 07, 2024  9:21 AM Antwanette L wrote: Reason for CRM: Patient wants to know how long does she have to take amLODipine  (NORVASC ) 10 MG tablet , atorvastatin  (LIPITOR) 40 MG tablet , and ,  telmisartan  (MICARDIS ) 20 MG tablet. Patient is requesting a callback at [phone]

## 2024-06-07 NOTE — Telephone Encounter (Signed)
 Left a message for the patient to return my call.

## 2024-06-08 NOTE — Telephone Encounter (Signed)
 Patient informed of the message below and voiced understanding

## 2024-06-25 ENCOUNTER — Encounter: Payer: Self-pay | Admitting: Family Medicine

## 2024-06-25 ENCOUNTER — Ambulatory Visit (INDEPENDENT_AMBULATORY_CARE_PROVIDER_SITE_OTHER): Admitting: Family Medicine

## 2024-06-25 VITALS — BP 128/60 | HR 66 | Temp 97.7°F | Wt 120.7 lb

## 2024-06-25 DIAGNOSIS — I10 Essential (primary) hypertension: Secondary | ICD-10-CM | POA: Diagnosis not present

## 2024-06-25 NOTE — Progress Notes (Signed)
 Established Patient Office Visit  Subjective   Patient ID: Barbara Kim, female    DOB: 04/12/1956  Age: 68 y.o. MRN: [id number]  Chief Complaint  Patient presents with   Medical Management of Chronic Issues    HPI   Follow-up hypertension.  Refer to recent notes for details.  Recent subacute CVA right thalamus and posterior temporal and occipital lobes.  She has had close follow-up with cardiology and neurology.  Last visit here her blood pressure was fairly consistently over 130/80 and we added low-dose Micardis  20 mg daily to her amlodipine .  Blood pressures at home have consistently been 120s systolic and 60s to 70s diastolic.  No dizziness.  No headaches.  She is building back her exercise regimen gradually.  No new neurologic symptoms.  Past Medical History:  Diagnosis Date   Chicken pox    Hypertension    Hyperthyroidism    25 years ago   Stroke [facility]) 2025   May   Past Surgical History:  Procedure Laterality Date   APPLICATION OF CRANIAL NAVIGATION Left 02/20/2021   Procedure: APPLICATION OF CRANIAL NAVIGATION;  Surgeon: Lanis Pupa, MD;  Location: [facility] OR;  Service: Neurosurgery;  Laterality: Left;   CRANIOTOMY Left 02/20/2021   Procedure: Stereotactic LEFT pterional craniotomy for resection of meningioma;  Surgeon: Lanis Pupa, MD;  Location: [facility] OR;  Service: Neurosurgery;  Laterality: Left;   TEE WITHOUT CARDIOVERSION N/A 05/23/2024   Procedure: ECHOCARDIOGRAM, TRANSESOPHAGEAL;  Surgeon: Perla Evalene PARAS, MD;  Location: [facility] ORS;  Service: Cardiovascular;  Laterality: N/A;   TONSILLECTOMY  1983    reports that she has never smoked. She has never used smokeless tobacco. She reports that she does not drink alcohol and does not use drugs. family history is not on file. She was adopted. Allergies  Allergen Reactions   Corn-Containing Products Dermatitis    Review of Systems  Constitutional:  Negative for malaise/fatigue.  Eyes:  Negative for blurred  vision.  Respiratory:  Negative for shortness of breath.   Cardiovascular:  Negative for chest pain.  Neurological:  Negative for dizziness, weakness and headaches.      Objective:     BP 128/60   Pulse 66   Temp 97.7 F (36.5 C) (Oral)   Wt 120 lb 11.2 oz (54.7 kg)   SpO2 97%   BMI 27.06 kg/m    Physical Exam Vitals reviewed.  Constitutional:      General: She is not in acute distress.    Appearance: She is well-developed. She is not ill-appearing.  Eyes:     Pupils: Pupils are equal, round, and reactive to light.  Neck:     Thyroid : No thyromegaly.     Vascular: No JVD.  Cardiovascular:     Rate and Rhythm: Normal rate and regular rhythm.     Heart sounds:     No gallop.  Pulmonary:     Effort: Pulmonary effort is normal. No respiratory distress.     Breath sounds: Normal breath sounds. No wheezing or rales.  Musculoskeletal:     Cervical back: Neck supple.  Neurological:     Mental Status: She is alert.      No results found for any visits on 06/25/24.    The ASCVD Risk score (Arnett DK, et al., 2019) failed to calculate for the following reasons:   Risk score cannot be calculated because patient has a medical history suggesting prior/existing ASCVD    Assessment & Plan:   Problem List Items Addressed  This Visit       Unprioritized   Essential hypertension, benign - Primary   Relevant Orders   Basic metabolic panel with GFR  Hypertension improved with recent addition of low-dose Micardis  20 mg daily to her amlodipine .  Continue low-sodium diet.  Continue regular aerobic exercise.  Continue close home monitoring.  Check basic metabolic panel with recent initiation of ARB.  Routine follow-up 6 months and repeat A1c and lipid panel at follow-up  Return in about 6 months (around 12/23/2024).    Wolm Scarlet, MD

## 2024-06-26 ENCOUNTER — Telehealth: Payer: Self-pay | Admitting: Family Medicine

## 2024-06-26 DIAGNOSIS — I639 Cerebral infarction, unspecified: Secondary | ICD-10-CM | POA: Diagnosis not present

## 2024-06-26 DIAGNOSIS — R0683 Snoring: Secondary | ICD-10-CM

## 2024-06-26 NOTE — Telephone Encounter (Unsigned)
 Copied from CRM #[id number]. Topic: Referral - Request for Referral >> Jun 26, 2024  8:30 AM Robinson H wrote: Did the patient discuss referral with their provider in the last year? Yes (If No - schedule appointment) (If Yes - send message)  Appointment offered? No  Type of order/referral and detailed reason for visit: Sleep Study  Preference of office, provider, location: Not sure  If referral order, have you been seen by this specialty before? No (If Yes, this issue or another issue? When? Where?  Can we respond through MyChart? No

## 2024-06-27 NOTE — Telephone Encounter (Signed)
 I spoke with the patient and she reported her family has been having concerns in regards to her snoring and she inquired if she could have sleep study done?

## 2024-06-27 NOTE — Telephone Encounter (Signed)
 Referral placed and left detailed message left on vm informing patient of this

## 2024-07-04 ENCOUNTER — Other Ambulatory Visit: Payer: Self-pay | Admitting: Emergency Medicine

## 2024-07-04 ENCOUNTER — Ambulatory Visit: Attending: Cardiovascular Disease

## 2024-07-04 DIAGNOSIS — I639 Cerebral infarction, unspecified: Secondary | ICD-10-CM

## 2024-08-08 ENCOUNTER — Other Ambulatory Visit: Payer: Self-pay | Admitting: Family Medicine

## 2024-08-08 MED ORDER — EZETIMIBE 10 MG PO TABS: 10.0000 mg | ORAL_TABLET | Freq: Every day | ORAL | 0 refills | Status: AC

## 2024-08-08 NOTE — Telephone Encounter (Signed)
 Copied from CRM #[id number]. Topic: Clinical - Medication Refill >> Aug 08, 2024 12:34 PM Lauren C wrote: Medication:  ezetimibe  (ZETIA ) 10 MG tablet telmisartan  (MICARDIS ) 20 MG tablet  Has the patient contacted their pharmacy? No  This is the patient's preferred pharmacy:  [id number] [address] [place], [place] - [address] [place] [address] Phone: [phone] Fax: [phone]  Is this the correct pharmacy for this prescription? Yes If no, delete pharmacy and type the correct one.   Has the prescription been filled recently? Yes  Is the patient out of the medication? No. She has 1 week left, possibly 1.5 weeks. She asks that if it is too early for a refill at this point, can she schedule it to be done next week.   Has the patient been seen for an appointment in the last year OR does the patient have an upcoming appointment? Yes  Can we respond through MyChart? Yes but would also like a call [phone]  Agent: Please be advised that Rx refills may take up to 3 business days. We ask that you follow-up with your pharmacy.

## 2024-08-13 ENCOUNTER — Telehealth: Payer: Self-pay | Admitting: Cardiovascular Disease

## 2024-08-13 NOTE — Telephone Encounter (Signed)
 Call from Natalie at [facility] came through on STAT phone regarding abnormal reading on Zio Heart Monitor on 07/14/2022 @ 4:03 AM Pg 17, Strip 6; pt of Dr. Evalene Lunger  Pt had back to back pauses: 1st pause 3.7 seconds 2nd  pause 3.8 seconds  Will forward to Dr. Gollan for review.

## 2024-08-13 NOTE — Telephone Encounter (Signed)
 [employer] calling with abnormal zio results.

## 2024-08-15 ENCOUNTER — Ambulatory Visit

## 2024-08-15 VITALS — BP 133/72 | HR 56 | Temp 97.9°F | Ht <= 58 in | Wt 120.2 lb

## 2024-08-15 DIAGNOSIS — R0683 Snoring: Secondary | ICD-10-CM | POA: Diagnosis not present

## 2024-08-15 DIAGNOSIS — I270 Primary pulmonary hypertension: Secondary | ICD-10-CM

## 2024-08-15 NOTE — Patient Instructions (Signed)
  VISIT SUMMARY: You came in today to discuss your snoring. We talked about your sleep habits, recent weight gain, and your current medications for hypertension. We also reviewed your history of a cryptogenic stroke and your previous tonsillectomy.  YOUR PLAN: -SNORING: Snoring is the sound produced by the vibration of the soft tissues in your throat during sleep. We will conduct a home sleep study to check for sleep apnea, a condition where breathing repeatedly stops and starts during sleep. If sleep apnea is confirmed, we discussed the potential benefits of CPAP therapy, which can improve your blood pressure control and sleep quality.  -ESSENTIAL HYPERTENSION: Essential hypertension is high blood pressure with no identifiable cause. Your blood pressure is currently managed with amlodipine  and telmisartan . We discussed that untreated sleep apnea can affect blood pressure control, so treating any potential sleep apnea could help manage your blood pressure better. Continue taking your current medications and take them during the daytime for optimal control.                Contains text generated by [employer].                                 Contains text generated by [employer].

## 2024-08-15 NOTE — Progress Notes (Addendum)
 Pulmonology Office Visit   Subjective:  Patient ID: Barbara Kim, female    DOB: Sep 22, 1956  MRN: [id number]  Referred by: Micheal Wolm ORN, MD  CC:  Chief Complaint  Patient presents with   Consult    snoring    HPI Barbara Kim is a 68 y.o. female with CVA, hypertension, prediabetes and brain meningioma presents for evaluation of snoring.  Respective notes from provider reviewed as appropriate to gather relevant information for patient care.   Discussed the use of AI scribe software for clinical note transcription with the patient, who gave verbal consent to proceed.  History of Present Illness   Barbara Kim is a 68 year old female who presents with snoring for evaluation. She was referred by her middle child for a sleep study.  Her primary concern is snoring. Her sleep routine involves going to bed between 8 and 9 PM, falling asleep quickly, and waking up once a night, usually around 4 AM. No episodes of apnea, morning headaches, dry mouth, or nightmares are reported. She drools during sleep, as evidenced by her pillowcase. Napping is infrequent and only occurs when she has no clients, as she works as a scientist, physiological.  Over the past two years, she has gained 15 pounds. Her current medications include amlodipine  and telmisartan  for hypertension. She has a history of cryptogenic stroke with transient symptoms such as chest compression and heaviness in the left leg, but no lasting deficits. She is under study for this condition and is awaiting follow-up.  She underwent a tonsillectomy in her twenties while living in [place] due to tonsillitis. She is adopted and does not know her family history. Her alcohol consumption is minimal, with occasional mimosas, and she drinks coffee only in the morning. She does not smoke.          PRIOR TESTS and IMAGING: TEE echo August 2025: EF 60-65%, right ventricular function normal.  No significant valvular  abnormality.      08/15/2024    3:00 PM  Results of the Epworth flowsheet  Sitting and reading 1  Watching TV 1  Sitting, inactive in a public place (e.g. a theatre or a meeting) 0  As a passenger in a car for an hour without a break 0  Lying down to rest in the afternoon when circumstances permit 1  Sitting and talking to someone 0  Sitting quietly after a lunch without alcohol 0  In a car, while stopped for a few minutes in traffic 0  Total score 3    Allergies: Corn-containing products  Current Outpatient Medications:    alendronate (FOSAMAX) 70 MG tablet, TAKE 1 TABLET BY MOUTH ONE TIME PER WEEK, Disp: 12 tablet, Rfl: 1   amLODipine  (NORVASC ) 10 MG tablet, Take 1 tablet (10 mg total) by mouth daily., Disp: 90 tablet, Rfl: 3   aspirin  EC 81 MG tablet, Take 81 mg by mouth daily., Disp: , Rfl:    atorvastatin  (LIPITOR) 40 MG tablet, Take 1 tablet (40 mg total) by mouth daily., Disp: 90 tablet, Rfl: 3   ezetimibe  (ZETIA ) 10 MG tablet, Take 1 tablet (10 mg total) by mouth daily., Disp: 90 tablet, Rfl: 0   telmisartan  (MICARDIS ) 20 MG tablet, Take 1 tablet (20 mg total) by mouth daily., Disp: 90 tablet, Rfl: 3   triamcinolone  cream (KENALOG ) 0.1 %, Apply 1 Application topically 2 (two) times daily as needed., Disp: 30 g, Rfl: 2   Vitamin D , Ergocalciferol , (DRISDOL ) 50000 units CAPS capsule,  TAKE 1 CAPSULE (50,000 UNITS TOTAL) BY MOUTH EVERY 7 (SEVEN) DAYS., Disp: 4 capsule, Rfl: 0 Past Medical History:  Diagnosis Date   Chicken pox    Hypertension    Hyperthyroidism    25 years ago   Stroke [facility]) 2025   May   Past Surgical History:  Procedure Laterality Date   APPLICATION OF CRANIAL NAVIGATION Left 02/20/2021   Procedure: APPLICATION OF CRANIAL NAVIGATION;  Surgeon: Lanis Pupa, MD;  Location: [facility] OR;  Service: Neurosurgery;  Laterality: Left;   CRANIOTOMY Left 02/20/2021   Procedure: Stereotactic LEFT pterional craniotomy for resection of meningioma;  Surgeon:  Lanis Pupa, MD;  Location: [facility] OR;  Service: Neurosurgery;  Laterality: Left;   TEE WITHOUT CARDIOVERSION N/A 05/23/2024   Procedure: ECHOCARDIOGRAM, TRANSESOPHAGEAL;  Surgeon: Perla Evalene PARAS, MD;  Location: [facility] ORS;  Service: Cardiovascular;  Laterality: N/A;   TONSILLECTOMY  1983   Family History  Adopted: Yes   Social History   Socioeconomic History   Marital status: Married    Spouse name: Not on file   Number of children: 3   Years of education: Not on file   Highest education level: Not on file  Occupational History   Not on file  Tobacco Use   Smoking status: Never   Smokeless tobacco: Never  Vaping Use   Vaping status: Never Used  Substance and Sexual Activity   Alcohol use: No   Drug use: No   Sexual activity: Not on file  Other Topics Concern   Not on file  Social History Narrative   Lives with spouse : all children grown : son in [place], 2 girls in [place]    Social Drivers of Health   Financial Resource Strain: Low Risk  (01/30/2024)   Overall Financial Resource Strain (CARDIA)    Difficulty of Paying Living Expenses: Not hard at all  Food Insecurity: No Food Insecurity (01/30/2024)   Hunger Vital Sign    Worried About Running Out of Food in the Last Year: Never true    Ran Out of Food in the Last Year: Never true  Transportation Needs: No Transportation Needs (01/30/2024)   PRAPARE - Administrator, Civil Service (Medical): No    Lack of Transportation (Non-Medical): No  Physical Activity: Sufficiently Active (01/30/2024)   Exercise Vital Sign    Days of Exercise per Week: 5 days    Minutes of Exercise per Session: 60 min  Stress: No Stress Concern Present (01/30/2024)   [employer] of Occupational Health - Occupational Stress Questionnaire    Feeling of Stress : Not at all  Social Connections: Socially Integrated (01/30/2024)   Social Connection and Isolation Panel    Frequency of Communication with Friends and Family: More than three times a  week    Frequency of Social Gatherings with Friends and Family: More than three times a week    Attends Religious Services: More than 4 times per year    Active Member of [employer] or Organizations: Yes    Attends Engineer, Structural: More than 4 times per year    Marital Status: Married  Catering Manager Violence: Not At Risk (01/30/2024)   Humiliation, Afraid, Rape, and Kick questionnaire    Fear of Current or Ex-Partner: No    Emotionally Abused: No    Physically Abused: No    Sexually Abused: No       Objective:  BP 133/72   Pulse (!) 56   Temp 97.9 F (36.6  C) (Oral)   Ht 4' 8.5 (1.435 m)   Wt 120 lb 3.2 oz (54.5 kg)   SpO2 96% Comment: room air  BMI 26.47 kg/m  BMI Readings from Last 3 Encounters:  08/15/24 26.47 kg/m  06/25/24 27.06 kg/m  05/25/24 26.70 kg/m    Physical Exam: Physical Exam   ENT: Oral cavity and pharynx normal. Normal mucosa. No hypertrophy of inferior turbinates. Tonsils are normal sized. Modified Mallampati score is 4 PULMONARY: Lungs clear to auscultation bilaterally, no adventitious breath sounds. CARDIOVASCULAR: Regular rate and rhythm, S1 S2 normal, no murmurs. ABDOMEN: Abdomen soft, nontender. Bowel sounds are normal. EXTREMITIES: No peripheral edema.       Diagnostic Review:  Last metabolic panel Lab Results  Component Value Date   GLUCOSE 92 05/21/2024   NA 141 05/21/2024   K 3.9 05/21/2024   CL 102 05/21/2024   CO2 22 05/21/2024   BUN 18 05/21/2024   CREATININE 0.68 05/21/2024   EGFR 95 05/21/2024   CALCIUM  9.5 05/21/2024   PHOS 1.8 (L) 02/23/2021   PROT 7.6 05/29/2024   ALBUMIN 4.3 05/29/2024   BILITOT 0.6 05/29/2024   ALKPHOS 67 05/29/2024   AST 15 05/29/2024   ALT 16 05/29/2024   ANIONGAP 8 02/23/2021         Assessment & Plan:   Assessment & Plan Snoring  Orders:   Home sleep test; Future  Primary pulmonary HTN (HCC)        Assessment and Plan I discussed with the patient the pathophysiology  of obstructive sleep apnea, its association with weight, and its negative effects on hypertension, diabetes, mental health, A-fib, stroke if left untreated.  I briefly discussed the treatment options for obstructive sleep apnea     Snoring Snoring without significant sleep apnea symptoms. Home sleep study warranted to rule out mild sleep apnea. Discussed potential benefits of treating sleep apnea, including improved blood pressure control and better sleep quality. CPAP therapy discussed if sleep apnea is confirmed. - Ordered home sleep study to evaluate for sleep apnea.  Essential hypertension Currently managed with amlodipine  and telmisartan . Discussed potential impact of untreated sleep apnea on blood pressure control. Advised that treating sleep apnea, if present, could aid in better blood pressure management. - Continue current antihypertensive regimen with amlodipine  and telmisartan .          Notes from PCP 06/25/24 reviewed as to gather relevant information for patient care and formulating plan.  She was counselled about not driving while drowsy which is common side effect of sleep related disorders.   Return for 1 mnth after Sleep study.   I personally spent a total of 25 minutes in the care of the patient today including preparing to see the patient, getting/reviewing separately obtained history, performing a medically appropriate exam/evaluation, counseling and educating, placing orders, documenting clinical information in the EHR, independently interpreting results, and communicating results.   Paije Goodhart, MD

## 2024-08-17 DIAGNOSIS — I639 Cerebral infarction, unspecified: Secondary | ICD-10-CM | POA: Diagnosis not present

## 2024-08-19 ENCOUNTER — Ambulatory Visit: Payer: Self-pay | Admitting: Cardiovascular Disease

## 2024-08-22 ENCOUNTER — Other Ambulatory Visit: Payer: Self-pay | Admitting: Emergency Medicine

## 2024-08-22 ENCOUNTER — Telehealth: Payer: Self-pay | Admitting: Cardiology

## 2024-08-22 ENCOUNTER — Encounter: Payer: Self-pay | Admitting: Emergency Medicine

## 2024-08-22 DIAGNOSIS — I4430 Unspecified atrioventricular block: Secondary | ICD-10-CM

## 2024-08-22 DIAGNOSIS — I639 Cerebral infarction, unspecified: Secondary | ICD-10-CM

## 2024-08-22 NOTE — Telephone Encounter (Signed)
-----   Message from Nurse Olivia HERO sent at 08/22/2024  1:49 PM EST ----- Please schedule patient for EP consult.

## 2024-08-22 NOTE — Telephone Encounter (Signed)
 See result note.

## 2024-08-22 NOTE — Telephone Encounter (Signed)
 LVM to schedule EP consult appt.

## 2024-08-22 NOTE — Telephone Encounter (Signed)
 Spoke with patient and notified her of the following from Dr. Gollan.  Event monitor Normal sinus rhythm Numerous pauses noted Would recommend consultation with cardiac EP For pauses, AV block, acute stroke, may need loop monitor  Patient verbalizes understanding. Order placed for EP.

## 2024-10-03 ENCOUNTER — Ambulatory Visit

## 2024-10-23 ENCOUNTER — Encounter

## 2024-10-31 ENCOUNTER — Encounter

## 2025-02-04 ENCOUNTER — Ambulatory Visit

## 2025-02-20 ENCOUNTER — Ambulatory Visit: Admitting: Neurology
